# Patient Record
Sex: Male | Born: 1935 | Race: White | Hispanic: No | Marital: Married | Smoking: Former smoker
Health system: Southern US, Community
[De-identification: ages and names within clinical notes are randomized; demographics above are authoritative.]

## PROBLEM LIST (undated history)

## (undated) DIAGNOSIS — E785 Hyperlipidemia, unspecified: Secondary | ICD-10-CM

## (undated) DIAGNOSIS — E039 Hypothyroidism, unspecified: Secondary | ICD-10-CM

## (undated) DIAGNOSIS — I4891 Unspecified atrial fibrillation: Secondary | ICD-10-CM

## (undated) DIAGNOSIS — I10 Essential (primary) hypertension: Secondary | ICD-10-CM

## (undated) DIAGNOSIS — E119 Type 2 diabetes mellitus without complications: Secondary | ICD-10-CM

## (undated) DIAGNOSIS — K219 Gastro-esophageal reflux disease without esophagitis: Secondary | ICD-10-CM

## (undated) DIAGNOSIS — K449 Diaphragmatic hernia without obstruction or gangrene: Secondary | ICD-10-CM

## (undated) DIAGNOSIS — R079 Chest pain, unspecified: Secondary | ICD-10-CM

## (undated) DIAGNOSIS — J45909 Unspecified asthma, uncomplicated: Secondary | ICD-10-CM

## (undated) HISTORY — PX: APPENDECTOMY: SHX54

## (undated) HISTORY — DX: Type 2 diabetes mellitus without complications: E11.9

## (undated) HISTORY — DX: Chest pain, unspecified: R07.9

## (undated) HISTORY — DX: Essential (primary) hypertension: I10

## (undated) HISTORY — DX: Unspecified atrial fibrillation: I48.91

## (undated) HISTORY — PX: CATARACT EXTRACTION: SUR2

## (undated) HISTORY — PX: HIATAL HERNIA REPAIR: SHX195

## (undated) HISTORY — DX: Hyperlipidemia, unspecified: E78.5

## (undated) HISTORY — PX: PERCUTANEOUS CORONARY STENT INTERVENTION (PCI-S): SHX6016

## (undated) HISTORY — DX: Hypothyroidism, unspecified: E03.9

## (undated) HISTORY — DX: Gastro-esophageal reflux disease without esophagitis: K21.9

## (undated) HISTORY — DX: Unspecified asthma, uncomplicated: J45.909

---

## 2014-03-13 ENCOUNTER — Ambulatory Visit (INDEPENDENT_AMBULATORY_CARE_PROVIDER_SITE_OTHER): Payer: Medicare Other | Admitting: Family

## 2014-03-13 ENCOUNTER — Encounter: Payer: Self-pay | Admitting: Family

## 2014-03-13 VITALS — BP 132/62 | HR 53 | Temp 97.9°F | Resp 18 | Ht 68.0 in | Wt 167.0 lb

## 2014-03-13 DIAGNOSIS — IMO0002 Reserved for concepts with insufficient information to code with codable children: Secondary | ICD-10-CM

## 2014-03-13 DIAGNOSIS — D869 Sarcoidosis, unspecified: Secondary | ICD-10-CM

## 2014-03-13 DIAGNOSIS — I482 Chronic atrial fibrillation, unspecified: Secondary | ICD-10-CM

## 2014-03-13 DIAGNOSIS — E1165 Type 2 diabetes mellitus with hyperglycemia: Secondary | ICD-10-CM | POA: Diagnosis not present

## 2014-03-13 DIAGNOSIS — I2510 Atherosclerotic heart disease of native coronary artery without angina pectoris: Secondary | ICD-10-CM | POA: Insufficient documentation

## 2014-03-13 DIAGNOSIS — E031 Congenital hypothyroidism without goiter: Secondary | ICD-10-CM

## 2014-03-13 DIAGNOSIS — E039 Hypothyroidism, unspecified: Secondary | ICD-10-CM

## 2014-03-13 DIAGNOSIS — E785 Hyperlipidemia, unspecified: Secondary | ICD-10-CM

## 2014-03-13 DIAGNOSIS — E119 Type 2 diabetes mellitus without complications: Secondary | ICD-10-CM

## 2014-03-13 DIAGNOSIS — I25119 Atherosclerotic heart disease of native coronary artery with unspecified angina pectoris: Secondary | ICD-10-CM

## 2014-03-13 DIAGNOSIS — I4891 Unspecified atrial fibrillation: Secondary | ICD-10-CM | POA: Insufficient documentation

## 2014-03-13 HISTORY — DX: Type 2 diabetes mellitus without complications: E11.9

## 2014-03-13 HISTORY — DX: Hypothyroidism, unspecified: E03.9

## 2014-03-13 HISTORY — DX: Sarcoidosis, unspecified: D86.9

## 2014-03-13 NOTE — Progress Notes (Signed)
Pre visit review using our clinic review tool, if applicable. No additional management support is needed unless otherwise documented below in the visit note. 

## 2014-03-13 NOTE — Assessment & Plan Note (Signed)
Stable at present time based on pt's log of blood sugars. Obtain A1c and BMET at next visit. Refer to opthalmology for diabetic eye exam and podiatry for diabetic foot care. Continue current metformin.

## 2014-03-13 NOTE — Assessment & Plan Note (Signed)
History of coronary artery disease with PCI. Refer to cardiology per patient request to establish care and follow up with as needed. Currently stable with no chest pain or shortness of breath.

## 2014-03-13 NOTE — Progress Notes (Signed)
   Subjective:    Patient ID: Benjamin Mccall, male    DOB: 08-24-1929, 80 y.o.   MRN: [id number]  Chief Complaint  Patient presents with  . Establish Care    HPI:  Benjamin Mccall is a 80 y.o. male who presents today to establish care and discuss multiple medical conditions. He will be living at [facility] in [place].    1) Diabetes - Type II diabetic maintained on metformin. Denies any adverse side effects. Has had some burning in his right lower foot - possibly related to previous back surgery. Due for diabetic eye exam and would like to establish with an ophthalmologist . Would like to get referred to podiatry for diabetic foot care. He presents records from the assisted living facility which sho morning CBG around 90s and evening CBG between 120-140.   2) Hypertension - Currently maintained on HCTZ. Denies any changes to vision, chest pain/discomfort, shortness of breath, or edema.   3) Hypothyroidism - Congenital hypothyroidism and is maintained on levothyroxine 100 mcg. Denies any changes to skin, hair or nails. Denies heat/cold intolerances.   4) Coronary artery disease - Previous history of PCI with stent placement. Also has history significant for atrial fibrillation for which he currently takes diltiazem.   5) Sarcoidosis - Was followed intermittently [place] in [place]. Denies any current shortness of breath. Request[place] for future management and to establish care.   6) GERD - Maintained on OTC omeprazole as needed for indigestion and hearburn.  Allergies  Allergen Reactions  . Reglan [Metoclopramide]     Tremors   . Sulfa Antibiotics    Medications:  Past Medical History  Diagnosis Date  . Diabetes mellitus without complication   . GERD (gastroesophageal reflux disease)   . Hyperlipidemia   . Hypertension   . Hypothyroid   . Chest pain   . Atrial fibrillation   . Asthma     Review of  Systems    See HPI  Objective:    BP 132/62 mmHg  Pulse 53  Temp(Src) 97.9 F (36.6 C) (Oral)  Resp 18  Ht 5\' 8"  (1.727 m)  Wt 167 lb (75.751 kg)  BMI 25.40 kg/m2  SpO2 97% Nursing note and vital signs reviewed.  Physical Exam  Constitutional: He is oriented to person, place, and time. He appears well-developed and well-nourished. No distress.  Elderly gentleman seated in the chair who is dressed appropriately and appears his stated age.   Cardiovascular: Normal rate, regular rhythm, normal heart sounds and intact distal pulses.   Rhythm sounded like normal sinus at this time.  Pulmonary/Chest: Effort normal and breath sounds normal.  Neurological: He is alert and oriented to person, place, and time.  Skin: Skin is warm and dry.  Psychiatric: He has a normal mood and affect. His behavior is normal. Judgment and thought content normal.       Assessment & Plan:

## 2014-03-13 NOTE — Patient Instructions (Signed)
Thank you for choosing [facility].  Summary/Instructions:  Referrals have been made during this visit. You should expect to hear back from our schedulers in about 7-10 days in regards to establishing an appointment with the specialists we discussed.   Please schedule a follow up for about 1 month. In the meantime continue to take your medications as prescribed.

## 2014-03-13 NOTE — Assessment & Plan Note (Signed)
Previously diagnosed and not currently experiencing any acute problems. Refer to pulmonology at patient's request to establish care and further management as needed.

## 2014-03-13 NOTE — Assessment & Plan Note (Signed)
Stable and maintained on levothyroxine. Continue current levothyroxine and obtain TSH at next visit.

## 2014-03-13 NOTE — Assessment & Plan Note (Signed)
Maintained on Diltiazem. Exam reveals regular rate and rhythm with auscultation. Continue to monitor. Follow up as neede.d

## 2014-03-14 NOTE — Addendum Note (Signed)
Addended by: Mercer PodWRENN, Jaeanna Mccomber E on: 03/14/2014 10:26 AM   Modules accepted: Medications

## 2014-03-27 ENCOUNTER — Ambulatory Visit (INDEPENDENT_AMBULATORY_CARE_PROVIDER_SITE_OTHER)
Admission: RE | Admit: 2014-03-27 | Discharge: 2014-03-27 | Disposition: A | Discharge status: Home / Self Care | Payer: Medicare Other | Source: Ambulatory Visit | Attending: Internal Medicine | Admitting: Internal Medicine

## 2014-03-27 ENCOUNTER — Encounter: Payer: Self-pay | Admitting: Internal Medicine

## 2014-03-27 ENCOUNTER — Ambulatory Visit (INDEPENDENT_AMBULATORY_CARE_PROVIDER_SITE_OTHER): Payer: Medicare Other | Admitting: Internal Medicine

## 2014-03-27 DIAGNOSIS — I25119 Atherosclerotic heart disease of native coronary artery with unspecified angina pectoris: Secondary | ICD-10-CM

## 2014-03-27 DIAGNOSIS — D869 Sarcoidosis, unspecified: Secondary | ICD-10-CM

## 2014-03-27 MED ORDER — BUDESONIDE-FORMOTEROL FUMARATE 160-4.5 MCG/ACT IN AERO: 2.0000 | INHALATION_SPRAY | Freq: Two times a day (BID) | RESPIRATORY_TRACT | Status: DC

## 2014-03-27 NOTE — Patient Instructions (Signed)
Try symbicort 160 one twice daily to see if you don't do just as well  Please remember to go to the x-ray department downstairs for your tests - we will call you with the results when they are available.  Please schedule a follow up visit in 3 months but call sooner if needed with pfts on return

## 2014-03-27 NOTE — Progress Notes (Signed)
   Subjective:    Patient ID: Benjamin Passeyonald R Mccall, male    DOB: 29-Nov-1929,   MRN: [id number]  HPI  [age] yowm quit smoking at dx of sarcoid at age 80 dx in [place] by surgeon with neck node with cough/sob rx prednisone x at least a decade with chronic doe ever since then x yardwork and leveled off d/c  prednisone referred to pulmonary 03/27/2014  by [facility] primary care with doe s  Cough     03/27/2014 1st  Pulmonary office visit/ Ladasha Schnackenberg   Chief Complaint  Patient presents with  . Pulmonary Consult    Referred by [name], NP. Pt states had lung bx age 80 and was dxed with Sarcoid. [place]. He c/o SOB that comes and goes- sometimes gets out of breath when walking the dog and exposed to cold weather.   doe is chronic and more related to how heavy he exerts   Maybe better with symbicort ? Makes him hoarse  Has combivent but doesn't think it helps/ doesn't use it  No obvious  day to day or daytime variabilty or assoc chronic cough or cp or chest tightness, subjective wheeze overt sinus or hb symptoms. No unusual exp hx or h/o childhood pna/ asthma or knowledge of premature birth.  Sleeping ok without nocturnal  or early am exacerbation  of respiratory  c/o's or need for noct saba. Also denies any obvious fluctuation of symptoms with weather or environmental changes or other aggravating or alleviating factors except as outlined above   Current Medications, Allergies, Complete Past Medical History, Past Surgical History, Family History, and Social History were reviewed in [facility] electronic medical record.              Review of Systems  Constitutional: Negative for fever, chills, activity change, appetite change and unexpected weight change.  HENT: Negative for congestion, dental problem, postnasal drip, rhinorrhea, sneezing, sore throat, trouble swallowing and voice change.   Eyes: Negative for visual disturbance.  Respiratory: Positive for  shortness of breath. Negative for cough and choking.   Cardiovascular: Negative for chest pain and leg swelling.  Gastrointestinal: Negative for nausea, vomiting and abdominal pain.  Genitourinary: Negative for difficulty urinating.  Musculoskeletal: Positive for arthralgias.  Skin: Negative for rash.  Psychiatric/Behavioral: Negative for behavioral problems and confusion.       Objective:   Physical Exam  amb wm with classic voice fatigue   Wt Readings from Last 3 Encounters:  03/27/14 169 lb (76.658 kg)  03/13/14 167 lb (75.751 kg)    Vital signs reviewed   HEENT: nl dentition, turbinates, and orophanx. Nl external ear canals without cough reflex   NECK :  without JVD/Nodes/TM/ nl carotid upstrokes bilaterally   LUNGS: no acc muscle use, clear to A and P bilaterally without cough on insp or exp maneuvers   CV:  RRR  no s3 or murmur or increase in P2, no edema   ABD:  soft and nontender with nl excursion in the supine position. No bruits or organomegaly, bowel sounds nl  MS:  warm without deformities, calf tenderness, cyanosis or clubbing  SKIN: warm and dry without lesions    NEURO:  alert, approp, no deficits     CXR  03/27/2014 :  No active lung disease. Small calcified mediastinal and hilar nodes consistent with prior granulomatous disease.      Assessment & Plan:

## 2014-03-27 NOTE — Progress Notes (Signed)
Quick Note:  Spoke with pt and notified of results per Dr. Wert. Pt verbalized understanding and denied any questions.  ______ 

## 2014-03-29 NOTE — Assessment & Plan Note (Addendum)
-   dx in 1980's by surgical bx of lymph node in neck and rx x 10 years with prednisone for cough/sob - 03/27/2014  Walked RA x 3 laps @ 185 ft each stopped due to  End of study, no sob or desat @ slow pace  Hx (which includes absence of cough or variability to symptoms or change since stopped prednisone) and exam and cxr do not suggest active dz but he could have airway involvement..... But  I suspect he really is not "symbicort" dep so will ask him to start to wean by reducing to one bid dosing on a trial basis and return for pfts    Each maintenance medication was reviewed in detail including most importantly the difference between maintenance and as needed and under what circumstances the prns are to be used.  Please see instructions for details which were reviewed in writing and the patient given a copy.

## 2014-03-30 ENCOUNTER — Ambulatory Visit: Payer: Self-pay | Admitting: Podiatry

## 2014-04-03 ENCOUNTER — Telehealth: Payer: Self-pay | Admitting: Family

## 2014-04-03 ENCOUNTER — Encounter: Payer: Self-pay | Admitting: Family

## 2014-04-03 ENCOUNTER — Ambulatory Visit: Payer: Medicare Other | Admitting: Family

## 2014-04-03 ENCOUNTER — Other Ambulatory Visit (INDEPENDENT_AMBULATORY_CARE_PROVIDER_SITE_OTHER): Payer: Medicare Other

## 2014-04-03 ENCOUNTER — Ambulatory Visit (INDEPENDENT_AMBULATORY_CARE_PROVIDER_SITE_OTHER): Payer: Medicare Other | Admitting: Family

## 2014-04-03 VITALS — BP 170/78 | HR 51 | Temp 97.8°F | Resp 18 | Ht 67.0 in | Wt 164.1 lb

## 2014-04-03 DIAGNOSIS — I25119 Atherosclerotic heart disease of native coronary artery with unspecified angina pectoris: Secondary | ICD-10-CM

## 2014-04-03 DIAGNOSIS — E1165 Type 2 diabetes mellitus with hyperglycemia: Secondary | ICD-10-CM

## 2014-04-03 DIAGNOSIS — R21 Rash and other nonspecific skin eruption: Secondary | ICD-10-CM

## 2014-04-03 DIAGNOSIS — IMO0002 Reserved for concepts with insufficient information to code with codable children: Secondary | ICD-10-CM

## 2014-04-03 DIAGNOSIS — I10 Essential (primary) hypertension: Secondary | ICD-10-CM

## 2014-04-03 HISTORY — DX: Rash and other nonspecific skin eruption: R21

## 2014-04-03 LAB — BASIC METABOLIC PANEL
BUN: 28 mg/dL — ABNORMAL HIGH (ref 6–23)
CHLORIDE: 101 meq/L (ref 96–112)
CO2: 30 meq/L (ref 19–32)
Calcium: 9.9 mg/dL (ref 8.4–10.5)
Creatinine, Ser: 1.4 mg/dL (ref 0.4–1.5)
GFR: 51.15 mL/min — ABNORMAL LOW (ref >60.00)
Glucose, Bld: 97 mg/dL (ref 70–99)
Potassium: 4.3 mEq/L (ref 3.5–5.1)
SODIUM: 139 meq/L (ref 135–145)

## 2014-04-03 LAB — HEMOGLOBIN A1C: Hgb A1c MFr Bld: 6.5 % (ref 4.6–6.5)

## 2014-04-03 NOTE — Patient Instructions (Signed)
Thank you for choosing [facility].  Summary/Instructions:  Please stop by the lab on the basement level of the building for your blood work. Your results will be released to MyChart (or called to you) after review, usually within 72 hours after test completion. If any changes need to be made, you will be notified at that same time.     

## 2014-04-03 NOTE — Progress Notes (Addendum)
Subjective:    Patient ID: Benjamin Mccall, male    DOB: 1929/06/25, 80 y.o.   MRN: [id number]  Chief Complaint  Patient presents with  . Follow-up    diabetes is well controlled, has a bump on right arm that he would like to have checked x4 months    HPI:  Benjamin Mccall is a 80 y.o. male who presents today for follow up.   1) Hypertension - Indicates it has been running between 120-130s at home. Currently maintained on diltiazem, hydrochlorthiazide. Denies any chest pain/discomfort, heart palpitations or edema of lower extremity. Eye exam scheduled.   BP Readings from Last 3 Encounters:  04/03/14 170/78  03/27/14 120/68  03/13/14 132/62    2) Diabetes - Blood sugars have been running under 100 in the mornings. Maintained on the metformin. Scheduled for diabetic eye exam and podiatry exam on 12/9.   Lab Results  Component Value Date   HGBA1C 6.5 04/03/2014      3) Bump on right arm - Noticed a bump on the inside of his right forearm about 4-5 months ago and has been staying about the same. Describes it as a raised bump with redness and itching. Has tried lotion, which has not helped greatly.   Allergies  Allergen Reactions  . Reglan [Metoclopramide]     Tremors   . Sulfa Antibiotics    Current Outpatient Prescriptions on File Prior to Visit  Medication Sig Dispense Refill  . ALPRAZolam (XANAX) 0.5 MG tablet Take 0.5 mg by mouth at bedtime as needed for anxiety.    [name]. aspirin 81 MG tablet Take 81 mg by mouth daily.    [name]. atorvastatin (LIPITOR) 20 MG tablet Take 20 mg by mouth daily.    . budesonide-formoterol (SYMBICORT) 160-4.5 MCG/ACT inhaler Inhale 2 puffs into the lungs 2 (two) times daily. 1 Inhaler 0  . Cholecalciferol (VITAMIN D3) 1000 UNITS CAPS Take by mouth daily.    . citalopram (CELEXA) 10 MG tablet Take 10 mg by mouth daily.    . clopidogrel (PLAVIX) 75 MG tablet Take 75 mg by mouth daily.    [name]. diltiazem (CARDIZEM CD) 240 MG 24 hr capsule Take 240 mg  by mouth daily.    [name]. ezetimibe (ZETIA) 10 MG tablet Take 10 mg by mouth daily.    . fluticasone (FLONASE) 50 MCG/ACT nasal spray Place 2 sprays into both nostrils daily.    [name]. gabapentin (NEURONTIN) 100 MG capsule Take 100 mg by mouth 3 (three) times daily.    [name]. guaiFENesin (MUCINEX) 600 MG 12 hr tablet Take by mouth 2 (two) times daily.    . hydrochlorothiazide (HYDRODIURIL) 25 MG tablet Take 25 mg by mouth daily.    [name]. ibuprofen (ADVIL,MOTRIN) 200 MG tablet Take 200 mg by mouth every 6 (six) hours as needed.    . Ipratropium-Albuterol (COMBIVENT RESPIMAT IN) Inhale into the lungs. 1 puff as needed    . levothyroxine (SYNTHROID, LEVOTHROID) 100 MCG tablet Take 100 mcg by mouth daily.    . metFORMIN (GLUCOPHAGE) 500 MG tablet Take by mouth 2 (two) times daily with a meal.    . Multiple Vitamin (THERA-TABS PO) Take by mouth daily.    . nitroGLYCERIN (NITRODUR - DOSED IN MG/24 HR) 0.6 mg/hr patch Place 0.6 mg onto the skin daily.    . nitroGLYCERIN (NITROSTAT) 0.4 MG SL tablet Place 0.4 mg under the tongue as needed for chest pain.    [name]. omeprazole (PRILOSEC) 20 MG capsule Take  20 mg by mouth daily.    . polyethylene glycol powder (GLYCOLAX/MIRALAX) powder Take 1 Container by mouth once.    . potassium chloride (MICRO-K) 10 MEQ CR capsule Take 10 mEq by mouth 2 (two) times daily.     No current facility-administered medications on file prior to visit.   Past Medical History  Diagnosis Date  . Diabetes mellitus without complication   . GERD (gastroesophageal reflux disease)   . Hyperlipidemia   . Hypertension   . Hypothyroid   . Chest pain   . Atrial fibrillation   . Asthma     Review of Systems     See HPI  Objective:    BP 170/78 mmHg  Pulse 51  Temp(Src) 97.8 F (36.6 C) (Oral)  Resp 18  Ht 5\' 7"  (1.702 m)  Wt 164 lb 1.9 oz (74.444 kg)  BMI 25.70 kg/m2  SpO2 97% Nursing note and vital signs reviewed.  Physical Exam  Constitutional: He is oriented to person, place, and  time. He appears well-developed and well-nourished. No distress.  Elderly gentleman seated in a chair with a cane . Appears his stated age. Dressed appropriate for the situation.  Cardiovascular: Normal rate, regular rhythm, normal heart sounds and intact distal pulses.   Pulmonary/Chest: Effort normal and breath sounds normal.  Neurological: He is alert and oriented to person, place, and time.  Skin: Skin is warm and dry.  Small firm nodule noted anterior forearm. Nonblanching. Denies tenderness. No warmth noted.  Psychiatric: He has a normal mood and affect. His behavior is normal. Judgment and thought content normal.       Assessment & Plan:

## 2014-04-03 NOTE — Telephone Encounter (Signed)
Please call the patient and informed him that his hemoglobin A1c was 6.5 which indicates his diabetes is well-controlled with his current regimen. We will also have to continue to monitor his kidney function, it is adequate at this time. The best thing we can do to preserve his kidney function is to maintain good blood sugar control and blood pressure control.

## 2014-04-03 NOTE — Assessment & Plan Note (Addendum)
Currently he remains stable this time. All diabetic prevention exams are scheduled or in the process.obtain A1c and basic metabolic panel today. Continue metformin at current dose.

## 2014-04-03 NOTE — Progress Notes (Signed)
Pre visit review using our clinic review tool, if applicable. No additional management support is needed unless otherwise documented below in the visit note. 

## 2014-04-03 NOTE — Assessment & Plan Note (Signed)
Stable with current regimen. Continue diltiazem and hydrochlorothiazide.

## 2014-04-04 ENCOUNTER — Ambulatory Visit (INDEPENDENT_AMBULATORY_CARE_PROVIDER_SITE_OTHER): Payer: Medicare Other | Admitting: Podiatry

## 2014-04-04 ENCOUNTER — Encounter: Payer: Self-pay | Admitting: Podiatry

## 2014-04-04 VITALS — BP 112/56 | HR 58 | Resp 18

## 2014-04-04 DIAGNOSIS — B353 Tinea pedis: Secondary | ICD-10-CM

## 2014-04-04 DIAGNOSIS — B351 Tinea unguium: Secondary | ICD-10-CM

## 2014-04-04 DIAGNOSIS — M79676 Pain in unspecified toe(s): Secondary | ICD-10-CM

## 2014-04-04 DIAGNOSIS — I25119 Atherosclerotic heart disease of native coronary artery with unspecified angina pectoris: Secondary | ICD-10-CM

## 2014-04-04 NOTE — Progress Notes (Signed)
   Subjective:    Patient ID: Benjamin Mccall, male    DOB: November 13, 1929, 80 y.o.   MRN: [id number]  HPI  80 year old male presents the office today for painful elongated toenails and for diabetic risk assessment. Patient states his blood sugar in the morning typically run between 83-90 5 in the afternoon between 120-1:30. Patient denies any history of ulceration or a intermittent claudication symptoms. He doesn't that he has had some back problems and he has had some tingling and numbness to his feet which is intermittent in nature. He is previously been told that this is from his back. He denies any recent redness or drainage from the nail sites. He is unable to trim the nails himself. No other complaints at this time.    Review of Systems  All other systems reviewed and are negative.      Objective:   Physical Exam AAO 3, NAD DP/PT pulses palpable bilaterally, CRT less than 3 seconds Protective sensation intact with Simms Weinstein monofilament, vibratory sensation intact, Achilles tendon reflex intact. Nails hypertrophic, dystrophic, elongated, brittle, discolored 10. No swelling erythema or drainage from around the nail sites. No open lesions or pre-ulcerative lesions. There is mild dry, scaly, pealing skin with a slight erythematous base the bilateral feet consistent with tinea pedis. MMT 5/5, ROM WNL No pain with calf compression, swelling, warmth, erythema.     Assessment & Plan:  80 year old male with symptomatic onychomycosis, likely tinea pedis -Treatment options were discussed including alternatives, risks, complications. -Nail sharply debrided 10 without complications. -For likely tinea pedis discussed purchasing OTC Lamisil cream or Lotrimin cream. -Discussed the importance of daily foot inspection. -Follow-up in 3 months or sooner if any problems are to arise or any changes symptoms. In the meantime, call the office in the questions, concerns, change in symptoms.

## 2014-04-04 NOTE — Patient Instructions (Signed)
Athlete's Foot Athlete's foot (tinea pedis) is a fungal infection of the skin on the feet. It often occurs on the skin between the toes or underneath the toes. It can also occur on the soles of the feet. Athlete's foot is more likely to occur in hot, humid weather. Not washing your feet or changing your socks often enough can contribute to athlete's foot. The infection can spread from person to person (contagious). CAUSES Athlete's foot is caused by a fungus. This fungus thrives in warm, moist places. Most people get athlete's foot by sharing shower stalls, towels, and wet floors with an infected person. People with weakened immune systems, including those with diabetes, may be more likely to get athlete's foot. SYMPTOMS   Itchy areas between the toes or on the soles of the feet.  White, flaky, or scaly areas between the toes or on the soles of the feet.  Tiny, intensely itchy blisters between the toes or on the soles of the feet.  Tiny cuts on the skin. These cuts can develop a bacterial infection.  Thick or discolored toenails. DIAGNOSIS  Your caregiver can usually tell what the problem is by doing a physical exam. Your caregiver may also take a skin sample from the rash area. The skin sample may be examined under a microscope, or it may be tested to see if fungus will grow in the sample. A sample may also be taken from your toenail for testing. TREATMENT  Over-the-counter and prescription medicines can be used to kill the fungus. These medicines are available as powders or creams. Your caregiver can suggest medicines for you. Fungal infections respond slowly to treatment. You may need to continue using your medicine for several weeks. PREVENTION   Do not share towels.  Wear sandals in wet areas, such as shared locker rooms and shared showers.  Keep your feet dry. Wear shoes that allow air to circulate. Wear cotton or wool socks. HOME CARE INSTRUCTIONS   Take medicines as directed by  your caregiver. Do not use steroid creams on athlete's foot.  Keep your feet clean and cool. Wash your feet daily and dry them thoroughly, especially between your toes.  Change your socks every day. Wear cotton or wool socks. In hot climates, you may need to change your socks 2 to 3 times per day.  Wear sandals or canvas tennis shoes with good air circulation.  If you have blisters, soak your feet in Burow's solution or Epsom salts for 20 to 30 minutes, 2 times a day to dry out the blisters. Make sure you dry your feet thoroughly afterward. SEEK MEDICAL CARE IF:   You have a fever.  You have swelling, soreness, warmth, or redness in your foot.  You are not getting better after 7 days of treatment.  You are not completely cured after 30 days.  You have any problems caused by your medicines. MAKE SURE YOU:   Understand these instructions.  Will watch your condition.  Will get help right away if you are not doing well or get worse. Document Released: 04/10/2000 Document Revised: 07/06/2011 Document Reviewed: 01/30/2011 Exit[employer] Patient Information 2015 [employer]. This information is not intended to replace advice given to you by your health care provider. Make sure you discuss any questions you have with your health care provider. Diabetes and Foot Care Diabetes may cause you to have problems because of poor blood supply (circulation) to your feet and legs. This may cause the skin on your feet to become   thinner, break easier, and heal more slowly. Your skin may become dry, and the skin may peel and crack. You may also have nerve damage in your legs and feet causing decreased feeling in them. You may not notice minor injuries to your feet that could lead to infections or more serious problems. Taking care of your feet is one of the most important things you can do for yourself.  HOME CARE INSTRUCTIONS  Wear shoes at all times, even in the house. Do not go barefoot. Bare feet are  easily injured.  Check your feet daily for blisters, cuts, and redness. If you cannot see the bottom of your feet, use a mirror or ask someone for help.  Wash your feet with warm water (do not use hot water) and mild soap. Then pat your feet and the areas between your toes until they are completely dry. Do not soak your feet as this can dry your skin.  Apply a moisturizing lotion or petroleum jelly (that does not contain alcohol and is unscented) to the skin on your feet and to dry, brittle toenails. Do not apply lotion between your toes.  Trim your toenails straight across. Do not dig under them or around the cuticle. File the edges of your nails with an emery board or nail file.  Do not cut corns or calluses or try to remove them with medicine.  Wear clean socks or stockings every day. Make sure they are not too tight. Do not wear knee-high stockings since they may decrease blood flow to your legs.  Wear shoes that fit properly and have enough cushioning. To break in new shoes, wear them for just a few hours a day. This prevents you from injuring your feet. Always look in your shoes before you put them on to be sure there are no objects inside.  Do not cross your legs. This may decrease the blood flow to your feet.  If you find a minor scrape, cut, or break in the skin on your feet, keep it and the skin around it clean and dry. These areas may be cleansed with mild soap and water. Do not cleanse the area with peroxide, alcohol, or iodine.  When you remove an adhesive bandage, be sure not to damage the skin around it.  If you have a wound, look at it several times a day to make sure it is healing.  Do not use heating pads or hot water bottles. They may burn your skin. If you have lost feeling in your feet or legs, you may not know it is happening until it is too late.  Make sure your health care provider performs a complete foot exam at least annually or more often if you have foot  problems. Report any cuts, sores, or bruises to your health care provider immediately. SEEK MEDICAL CARE IF:   You have an injury that is not healing.  You have cuts or breaks in the skin.  You have an ingrown nail.  You notice redness on your legs or feet.  You feel burning or tingling in your legs or feet.  You have pain or cramps in your legs and feet.  Your legs or feet are numb.  Your feet always feel cold. SEEK IMMEDIATE MEDICAL CARE IF:   There is increasing redness, swelling, or pain in or around a wound.  There is a red line that goes up your leg.  Pus is coming from a wound.  You develop a fever   or as directed by your health care provider.  You notice a bad smell coming from an ulcer or wound. Document Released: 04/10/2000 Document Revised: 12/14/2012 Document Reviewed: 09/20/2012 ExitCare Patient Information 2015 [employer]. This information is not intended to replace advice given to you by your health care provider. Make sure you discuss any questions you have with your health care provider.  

## 2014-04-05 NOTE — Telephone Encounter (Signed)
Pt aware.

## 2014-04-23 NOTE — Progress Notes (Signed)
Patient ID: Manson PasseyDonald R Heidler, male   DOB: May 30, 1929, 80 y.o.   MRN: [id number]     80 yo diabetic referred for cardiovascular f/u History of HTN and chronic sarcoid seen by Dr Johnnette LitterWert Saw Dr Filbert [place]ilderHobart in [place].  Distant history of CAD with stents more then 10 years ago Had angina at that time and none recently.  ? PAF but in SR now and not on anticoagulation.  He still drives and gets around well outside of orthopedic issues. Mild exertional dyspnea no chest pain palpitations , syncope or TIA's.  Compliant with meds Lives at [facility] with wife of 50 years although she has recently been at [facility] for rehab from stroke.  Son with him today and seems invested in his care.  Mild chronic LE edema on diuretic     ROS: Denies fever, malais, weight loss, blurry vision, decreased visual acuity, cough, sputum, SOB, hemoptysis, pleuritic pain, palpitaitons, heartburn, abdominal pain, melena, lower extremity edema, claudication, or rash.  All other systems reviewed and negative   General: Affect appropriate Frail elderly male  HEENT: normal Neck supple with no adenopathy JVP normal no bruits no thyromegaly Lungs clear with no wheezing and good diaphragmatic motion Heart:  S1/S2 systolic ejection  murmur,rub, gallop or click PMI normal Abdomen: benighn, BS positve, no tenderness, no AAA no bruit.  No HSM or HJR Distal pulses intact with no bruits Plus one bilateral edema Neuro non-focal Skin warm and dry No muscular weakness  Medications Current Outpatient Prescriptions  Medication Sig Dispense Refill  . ALPRAZolam (XANAX) 0.5 MG tablet Take 0.5 mg by mouth at bedtime as needed for anxiety.    [name]. aspirin 81 MG tablet Take 81 mg by mouth daily.    [name]. atorvastatin (LIPITOR) 20 MG tablet Take 20 mg by mouth daily.    . budesonide-formoterol (SYMBICORT) 160-4.5 MCG/ACT inhaler Inhale 2 puffs into the lungs 2 (two) times daily. 1 Inhaler 0  . Cholecalciferol (VITAMIN D3) 1000 UNITS CAPS Take by  mouth daily.    . citalopram (CELEXA) 10 MG tablet Take 10 mg by mouth daily.    . clopidogrel (PLAVIX) 75 MG tablet Take 75 mg by mouth daily.    [name]. diltiazem (CARDIZEM CD) 240 MG 24 hr capsule Take 240 mg by mouth daily.    [name]. ezetimibe (ZETIA) 10 MG tablet Take 10 mg by mouth daily.    . fluticasone (FLONASE) 50 MCG/ACT nasal spray Place 2 sprays into both nostrils daily.    [name]. gabapentin (NEURONTIN) 100 MG capsule Take 100 mg by mouth 3 (three) times daily.    [name]. guaiFENesin (MUCINEX) 600 MG 12 hr tablet Take by mouth 2 (two) times daily.    . hydrochlorothiazide (HYDRODIURIL) 25 MG tablet Take 25 mg by mouth daily.    [name]. ibuprofen (ADVIL,MOTRIN) 200 MG tablet Take 200 mg by mouth every 6 (six) hours as needed.    . Ipratropium-Albuterol (COMBIVENT RESPIMAT IN) Inhale into the lungs. 1 puff as needed    . levothyroxine (SYNTHROID, LEVOTHROID) 100 MCG tablet Take 100 mcg by mouth daily.    . metFORMIN (GLUCOPHAGE) 500 MG tablet Take by mouth 2 (two) times daily with a meal.    . Multiple Vitamin (THERA-TABS PO) Take by mouth daily.    . nitroGLYCERIN (NITRODUR - DOSED IN MG/24 HR) 0.6 mg/hr patch Place 0.6 mg onto the skin daily.    . nitroGLYCERIN (NITROSTAT) 0.4 MG SL tablet Place 0.4 mg under the tongue as needed for chest  pain.    . omeprazole (PRILOSEC) 20 MG capsule Take 20 mg by mouth daily.    . polyethylene glycol powder (GLYCOLAX/MIRALAX) powder Take 1 Container by mouth once.    . potassium chloride (MICRO-K) 10 MEQ CR capsule Take 10 mEq by mouth 2 (two) times daily.     No current facility-administered medications for this visit.    Allergies Reglan and Sulfa antibiotics  Family History: Family History  Problem Relation Age of Onset  . Hypertension Mother   . Stroke Mother   . Ovarian cancer Mother   . Stroke Father   . Diabetes Son     Social History: History   Social History  . Marital Status: Married    Spouse Name: N/A    Number of Children: 2  . Years of  Education: 12   Occupational History  . 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 Smoking status: Former Smoker -- 0.50 packs/day for 7 years    Types: Pipe, Cigars    Quit date: 04/27/1968  . Smokeless tobacco: Never Used  . Alcohol Use: No  . Drug Use: No  . Sexual Activity: Not on file   Other Topics Concern  . Not on file   Social History Narrative   [place]. Residing in [facility] Living with his wife. Denies any beliefs effecting healthcare.     Past Surgical History  Procedure Laterality Date  . Percutaneous coronary stent intervention (pci-s)    . Hiatal hernia repair    . Cataract extraction    . Appendectomy      Past Medical History  Diagnosis Date  . Diabetes mellitus without complication   . GERD (gastroesophageal reflux disease)   . Hyperlipidemia   . Hypertension   . Hypothyroid   . Chest pain   . Atrial fibrillation   . Asthma     Electrocardiogram:  SR rate 51  LAD PR 230  LVH   Assessment and Plan

## 2014-04-24 ENCOUNTER — Ambulatory Visit (INDEPENDENT_AMBULATORY_CARE_PROVIDER_SITE_OTHER): Payer: Medicare Other | Admitting: Cardiovascular Disease

## 2014-04-24 ENCOUNTER — Encounter: Payer: Self-pay | Admitting: Cardiovascular Disease

## 2014-04-24 VITALS — BP 124/70 | HR 51 | Ht 67.0 in | Wt 165.0 lb

## 2014-04-24 DIAGNOSIS — E785 Hyperlipidemia, unspecified: Secondary | ICD-10-CM

## 2014-04-24 DIAGNOSIS — I2583 Coronary atherosclerosis due to lipid rich plaque: Secondary | ICD-10-CM

## 2014-04-24 DIAGNOSIS — I480 Paroxysmal atrial fibrillation: Secondary | ICD-10-CM

## 2014-04-24 DIAGNOSIS — R011 Cardiac murmur, unspecified: Secondary | ICD-10-CM

## 2014-04-24 DIAGNOSIS — I2510 Atherosclerotic heart disease of native coronary artery without angina pectoris: Secondary | ICD-10-CM

## 2014-04-24 DIAGNOSIS — I10 Essential (primary) hypertension: Secondary | ICD-10-CM

## 2014-04-24 NOTE — Assessment & Plan Note (Addendum)
Stable with no angina and good activity level.  Continue medical Rx Not clear of reason for continued plavix but no bleeding issues Given age, distant nature of stents and asymptomatic state don't think stress test needed  Echo to assess EF

## 2014-04-24 NOTE — Assessment & Plan Note (Signed)
Well controlled.  Continue current medications and low sodium Dash type diet.    

## 2014-04-24 NOTE — Assessment & Plan Note (Signed)
Unclear history ECG NSR today no palpitations not on anticoagulation Monitor  Echo for EF and atrial sizes

## 2014-04-24 NOTE — Assessment & Plan Note (Signed)
Cholesterol is at goal.  Continue current dose of statin and diet Rx.  No myalgias or side effects.  F/U  LFT's in 6 months. No results found for: LDLCALC           

## 2014-04-24 NOTE — Patient Instructions (Signed)

## 2014-04-25 ENCOUNTER — Ambulatory Visit (HOSPITAL_COMMUNITY): Payer: Medicare Other | Attending: Cardiology | Admitting: Radiology

## 2014-04-25 DIAGNOSIS — R079 Chest pain, unspecified: Secondary | ICD-10-CM | POA: Diagnosis not present

## 2014-04-25 DIAGNOSIS — I480 Paroxysmal atrial fibrillation: Secondary | ICD-10-CM

## 2014-04-25 DIAGNOSIS — E785 Hyperlipidemia, unspecified: Secondary | ICD-10-CM | POA: Diagnosis not present

## 2014-04-25 DIAGNOSIS — R011 Cardiac murmur, unspecified: Secondary | ICD-10-CM

## 2014-04-25 NOTE — Progress Notes (Signed)
Echocardiogram performed.  

## 2014-04-29 ENCOUNTER — Encounter (HOSPITAL_COMMUNITY): Payer: Self-pay | Admitting: Emergency Medicine

## 2014-04-29 ENCOUNTER — Emergency Department (HOSPITAL_COMMUNITY)
Admission: EM | Admit: 2014-04-29 | Discharge: 2014-04-29 | Disposition: A | Discharge status: Home / Self Care | Payer: Medicare Other | Attending: Emergency Medicine | Admitting: Emergency Medicine

## 2014-04-29 DIAGNOSIS — E039 Hypothyroidism, unspecified: Secondary | ICD-10-CM | POA: Insufficient documentation

## 2014-04-29 DIAGNOSIS — Z7982 Long term (current) use of aspirin: Secondary | ICD-10-CM | POA: Diagnosis not present

## 2014-04-29 DIAGNOSIS — Z7902 Long term (current) use of antithrombotics/antiplatelets: Secondary | ICD-10-CM | POA: Diagnosis not present

## 2014-04-29 DIAGNOSIS — I10 Essential (primary) hypertension: Secondary | ICD-10-CM | POA: Insufficient documentation

## 2014-04-29 DIAGNOSIS — K219 Gastro-esophageal reflux disease without esophagitis: Secondary | ICD-10-CM | POA: Insufficient documentation

## 2014-04-29 DIAGNOSIS — Z87891 Personal history of nicotine dependence: Secondary | ICD-10-CM | POA: Diagnosis not present

## 2014-04-29 DIAGNOSIS — E785 Hyperlipidemia, unspecified: Secondary | ICD-10-CM | POA: Diagnosis not present

## 2014-04-29 DIAGNOSIS — Z79899 Other long term (current) drug therapy: Secondary | ICD-10-CM | POA: Insufficient documentation

## 2014-04-29 DIAGNOSIS — E119 Type 2 diabetes mellitus without complications: Secondary | ICD-10-CM | POA: Insufficient documentation

## 2014-04-29 DIAGNOSIS — R040 Epistaxis: Secondary | ICD-10-CM | POA: Insufficient documentation

## 2014-04-29 DIAGNOSIS — J45909 Unspecified asthma, uncomplicated: Secondary | ICD-10-CM | POA: Insufficient documentation

## 2014-04-29 DIAGNOSIS — Z7951 Long term (current) use of inhaled steroids: Secondary | ICD-10-CM | POA: Diagnosis not present

## 2014-04-29 MED ORDER — OXYMETAZOLINE HCL 0.05 % NA SOLN
2.0000 | Freq: Once | NASAL | Status: AC
  Administered 2014-04-29: 2 via NASAL
  Filled 2014-04-29: qty 15

## 2014-04-29 NOTE — ED Notes (Signed)
Pt reports that he has had a nosebleed since 1900 after blowing his nose today. Pt states that the bleeding has slowed, but continues to bleed when he blows his nose to clear the clots. Pt a&ox4, skin warm and dry, ambulatory without difficulty, denies pain.

## 2014-04-29 NOTE — ED Provider Notes (Signed)
CSN: [id number]     Arrival date & time 04/29/14  2157 History   First MD Initiated Contact with Patient 04/29/14 2232     This chart was scribed for non-physician practitioner, Earley Favor, FNP working with Purvis Sheffield, MD by Arlan Organ, ED Scribe. This patient was seen in room WTR5/WTR5 and the patient's care was started at 10:41 PM.   Chief Complaint  Patient presents with  . Epistaxis   The history is provided by the patient. No language interpreter was used.    HPI Comments: Benjamin Mccall is a 81 y.o. male with a PMHx of DM, GERD, hyperlipidemia, HTN, hypothyroid, and A-Fib who presents to the Emergency Department complaining of L sided epistaxis onset just prior to arrival. Pt reports ongoing intermittent epistaxis since 1990. States he blew his nose today and noted ongoing bleeding from his nose. Bleeding has slowed but continues to bleed when he blows his nose to clear clots. Pt admits to recent upper respiratory cold symptoms. Mr. Kerby was prescribed Flonase 2 weeks ago to help manage his symptoms. No recent fever of chills. Pt with known allergies to Reglan and Sulfa antibiotics.  Past Medical History  Diagnosis Date  . Diabetes mellitus without complication   . GERD (gastroesophageal reflux disease)   . Hyperlipidemia   . Hypertension   . Hypothyroid   . Chest pain   . Atrial fibrillation   . Asthma    Past Surgical History  Procedure Laterality Date  . Percutaneous coronary stent intervention (pci-s)    . Hiatal hernia repair    . Cataract extraction    . Appendectomy     Family History  Problem Relation Age of Onset  . Hypertension Mother   . Stroke Mother   . Ovarian cancer Mother   . Stroke Father   . Diabetes Son    History  Substance Use Topics  . Smoking status: Former Smoker -- 0.50 packs/day for 7 years    Types: Pipe, Cigars    Quit date: 04/27/1968  . Smokeless tobacco: Never Used  . Alcohol Use: No    Review of Systems   Constitutional: Negative for fever and chills.  HENT: Positive for nosebleeds.       Allergies  Reglan and Sulfa antibiotics  Home Medications   Prior to Admission medications   Medication Sig Start Date End Date Taking? Authorizing Provider  ALPRAZolam Prudy Feeler) 0.5 MG tablet Take 0.5 mg by mouth at bedtime as needed for anxiety.    Historical Provider, MD  aspirin 81 MG tablet Take 81 mg by mouth daily.    Historical Provider, MD  atorvastatin (LIPITOR) 20 MG tablet Take 20 mg by mouth daily.    Historical Provider, MD  budesonide-formoterol (SYMBICORT) 160-4.5 MCG/ACT inhaler Inhale 2 puffs into the lungs 2 (two) times daily. 03/27/14   Nyoka Cowden, MD  Cholecalciferol (VITAMIN D3) 1000 UNITS CAPS Take by mouth daily.    Historical Provider, MD  citalopram (CELEXA) 10 MG tablet Take 10 mg by mouth daily.    Historical Provider, MD  clopidogrel (PLAVIX) 75 MG tablet Take 75 mg by mouth daily.    Historical Provider, MD  diltiazem (CARDIZEM CD) 240 MG 24 hr capsule Take 240 mg by mouth daily.    Historical Provider, MD  ezetimibe (ZETIA) 10 MG tablet Take 10 mg by mouth daily.    Historical Provider, MD  fluticasone (FLONASE) 50 MCG/ACT nasal spray Place 2 sprays into both nostrils daily.  Historical Provider, MD  gabapentin (NEURONTIN) 300 MG capsule Take 300 mg by mouth at bedtime.    Historical Provider, MD  guaiFENesin (MUCINEX) 600 MG 12 hr tablet Take by mouth 2 (two) times daily.    Historical Provider, MD  hydrochlorothiazide (HYDRODIURIL) 25 MG tablet Take 25 mg by mouth daily.    Historical Provider, MD  ibuprofen (ADVIL,MOTRIN) 200 MG tablet Take 200 mg by mouth every 6 (six) hours as needed.    Historical Provider, MD  Ipratropium-Albuterol (COMBIVENT RESPIMAT IN) Inhale into the lungs. 1 puff as needed    Historical Provider, MD  levothyroxine (SYNTHROID, LEVOTHROID) 100 MCG tablet Take 100 mcg by mouth daily.    Historical Provider, MD  metFORMIN (GLUCOPHAGE) 500 MG  tablet Take by mouth 2 (two) times daily with a meal.    Historical Provider, MD  Multiple Vitamin (THERA-TABS PO) Take by mouth daily.    Historical Provider, MD  nitroGLYCERIN (NITRODUR - DOSED IN MG/24 HR) 0.6 mg/hr patch Place 0.6 mg onto the skin daily.    Historical Provider, MD  nitroGLYCERIN (NITROSTAT) 0.4 MG SL tablet Place 0.4 mg under the tongue as needed for chest pain.    Historical Provider, MD  omeprazole (PRILOSEC) 20 MG capsule Take 20 mg by mouth daily.    Historical Provider, MD  polyethylene glycol powder (GLYCOLAX/MIRALAX) powder Take 1 Container by mouth once.    Historical Provider, MD  potassium chloride (MICRO-K) 10 MEQ CR capsule Take 10 mEq by mouth 2 (two) times daily.    Historical Provider, MD   Triage Vitals: BP 154/62 mmHg  Pulse 55  Temp(Src) 98.6 F (37 C) (Oral)  Resp 20  SpO2 99%   Physical Exam  Constitutional: He is oriented to person, place, and time. He appears well-developed and well-nourished.  HENT:  Head: Normocephalic.  Nose: Mucosal edema present. No rhinorrhea or septal deviation. No epistaxis.  Mouth/Throat: Oropharynx is clear and moist.  superficial vessels no active bleeding at this time   Eyes: EOM are normal.  Neck: Normal range of motion.  Pulmonary/Chest: Effort normal.  Abdominal: He exhibits no distension.  Musculoskeletal: Normal range of motion.  Neurological: He is alert and oriented to person, place, and time.  Skin: Skin is warm.  Psychiatric: He has a normal mood and affect.  Nursing note and vitals reviewed.   ED Course  Procedures (including critical care time)  DIAGNOSTIC STUDIES: Oxygen Saturation is 99% on RA, Normal by my interpretation.    COORDINATION OF CARE: 10:34 PM-Discussed treatment plan with pt at bedside and pt agreed to plan.     Labs Review Labs Reviewed - No data to display  Imaging Review No results found.   EKG Interpretation None      MDM  Will stop Flonase use Afrin BID for 3  days  Final diagnoses:  None       I personally performed the services described in this documentation, which was scribed in my presence. The recorded information has been reviewed and is accurate.    Arman Filter, NP 04/29/14 2258  Purvis Sheffield, MD 04/29/14 2325

## 2014-04-29 NOTE — Discharge Instructions (Signed)
Nosebleed A nosebleed can be caused by many things, including:  Getting hit hard in the nose.  Infections.  Dry nose.  Colds.  Medicines. Your doctor may do lab testing if you get nosebleeds a lot and the cause is not known. HOME CARE   If your nose was packed with material, keep it there until your doctor takes it out. Put the pack back in your nose if the pack falls out.  Do not blow your nose for 12 hours after the nosebleed.  Sit up and bend forward if your nose starts bleeding again. Pinch the front half of your nose nonstop for 20 minutes.  Put petroleum jelly inside your nose every morning if you have a dry nose.  Use a humidifier to make the air less dry.  Do not take aspirin.  Try not to strain, lift, or bend at the waist for many days after the nosebleed. GET HELP RIGHT AWAY IF:   Nosebleeds keep happening and are hard to stop or control.  You have bleeding or bruises that are not normal on other parts of the body.  You have a fever.  The nosebleeds get worse.  You get lightheaded, feel faint, sweaty, or throw up (vomit) blood. MAKE SURE YOU:   Understand these instructions.  Will watch your condition.  Will get help right away if you are not doing well or get worse. Document Released: 01/21/2008 Document Revised: 07/06/2011 Document Reviewed: 01/21/2008 [facility] Patient Information 2015 [place], [place]. This information is not intended to replace advice given to you by your health care provider. Make sure you discuss any questions you have with your health care provider.     STOP THE FLONASE  USE THE AFRIN 1-2 SPRAYS TO LEFT NOSTRIL TWICE A DAY FOR 3 DAYS ONLY

## 2014-06-29 ENCOUNTER — Ambulatory Visit (INDEPENDENT_AMBULATORY_CARE_PROVIDER_SITE_OTHER): Payer: Medicare Other | Admitting: Internal Medicine

## 2014-06-29 ENCOUNTER — Encounter: Payer: Self-pay | Admitting: Internal Medicine

## 2014-06-29 VITALS — BP 146/76 | HR 60 | Ht 66.5 in | Wt 170.0 lb

## 2014-06-29 DIAGNOSIS — J449 Chronic obstructive pulmonary disease, unspecified: Secondary | ICD-10-CM | POA: Diagnosis not present

## 2014-06-29 DIAGNOSIS — D869 Sarcoidosis, unspecified: Secondary | ICD-10-CM | POA: Diagnosis not present

## 2014-06-29 LAB — PULMONARY FUNCTION TEST
DL/VA % pred: 99 %
DL/VA: 4.33 ml/min/mmHg/L
DLCO UNC: 18 ml/min/mmHg
DLCO unc % pred: 65 %
FEF 25-75 POST: 1.08 L/s
FEF 25-75 Pre: 0.75 L/sec
FEF2575-%CHANGE-POST: 44 %
FEF2575-%Pred-Post: 69 %
FEF2575-%Pred-Pre: 48 %
FEV1-%CHANGE-POST: 10 %
FEV1-%Pred-Post: 72 %
FEV1-%Pred-Pre: 65 %
FEV1-POST: 1.7 L
FEV1-Pre: 1.54 L
FEV1FVC-%Change-Post: 4 %
FEV1FVC-%Pred-Pre: 89 %
FEV6-%Change-Post: 5 %
FEV6-%PRED-PRE: 76 %
FEV6-%Pred-Post: 80 %
FEV6-POST: 2.49 L
FEV6-PRE: 2.37 L
FEV6FVC-%Change-Post: 0 %
FEV6FVC-%PRED-POST: 105 %
FEV6FVC-%Pred-Pre: 105 %
FVC-%Change-Post: 5 %
FVC-%PRED-PRE: 72 %
FVC-%Pred-Post: 76 %
FVC-PRE: 2.43 L
FVC-Post: 2.57 L
PRE FEV6/FVC RATIO: 97 %
Post FEV1/FVC ratio: 66 %
Post FEV6/FVC ratio: 97 %
Pre FEV1/FVC ratio: 64 %
RV % pred: 115 %
RV: 2.91 L
TLC % pred: 80 %
TLC: 5.11 L

## 2014-06-29 NOTE — Patient Instructions (Addendum)
You have mild to moderate copd which is unlikely to progress  Continue symbicort 160 Take 2 puffs first thing in am and then another 2 puffs about 12 hours later.   Only use your albuterol(proair) as a rescue medication to be used if you can't catch your breath by resting or doing a relaxed purse lip breathing pattern.  - The less you use it, the better it will work when you need it. - Ok to use up to 2 puffs  every 4 hours if you must but call for immediate appointment if use goes up over your usual need - Don't leave home without it !!  (think of it like the spare tire for your car)    If you are satisfied with your treatment plan,  let your doctor know and he/she can either refill your medications or you can return here when your prescription runs out.     If in any way you are not 100% satisfied,  please tell us.  If 100% better, tell your friends!  Pulmonary follow up is as needed

## 2014-06-29 NOTE — Progress Notes (Signed)
PFT done today. 

## 2014-06-29 NOTE — Progress Notes (Signed)
Subjective:    Patient ID: Benjamin Mccall, male    DOB: 17-Sep-1929,   MRN: [id number]    Brief patient profile:  [age] yowm quit smoking at dx of sarcoid at age 89 dx in [place] by surgeon with neck node with cough/sob rx prednisone x at least a decade with chronic doe ever since then x yardwork and leveled off d/cprednisone around age 81 referred to pulmonary 03/27/2014  by Benjamin Mccall with doe s  Cough     History of Present Illness  03/27/2014 1st [facility] office visit/ Benjamin Mccall   Chief Complaint  Patient presents with  . Pulmonary Consult    Referred by Benjamin EkeGreg Calone, NP. Pt states had lung bx age 81 and was dxed with Sarcoid. He recently moved here to [place] from [facility] [place]. He c/o SOB that comes and goes- sometimes gets out of breath when walking the dog and exposed to cold weather.   doe is chronic and more related to how heavy he exerts   Maybe better with symbicort ? Makes him hoarse  Has combivent but doesn't think it helps/ doesn't use it rec Try symbicort 160 one twice daily to see if you don't do just as well    06/29/2014 f/u ov/Benjamin Mccall re: copd GOLD II Chief Complaint  Patient presents with  . Follow-up    PFT done today. Pt states that his breathing seems to be doing some better. He is still using symbicort 2 puffs bid. No new co's today.   better and Not limited by breathing from desired activities      No obvious day to day or daytime variabilty or assoc chronic cough or cp or chest tightness, subjective wheeze overt sinus or hb symptoms. No unusual exp hx or h/o childhood pna/ asthma or knowledge of premature birth.  Sleeping ok without nocturnal  or early am exacerbation  of respiratory  c/o's or need for noct saba. Also denies any obvious fluctuation of symptoms with weather or environmental changes or other aggravating or alleviating factors except as outlined above   Current Medications, Allergies, Complete Past Medical History, Past Surgical History, Family  History, and Social History were reviewed in Benjamin Mccall [facility] electronic medical record.  ROS  The following are not active complaints unless bolded sore throat, dysphagia, dental problems, itching, sneezing,  nasal congestion or excess/ purulent secretions, ear ache,   fever, chills, sweats, unintended wt loss, pleuritic or exertional cp, hemoptysis,  orthopnea pnd or leg swelling, presyncope, palpitations, heartburn, abdominal pain, anorexia, nausea, vomiting, diarrhea  or change in bowel or urinary habits, change in stools or urine, dysuria,hematuria,  rash, arthralgias, visual complaints, headache, numbness weakness or ataxia or problems with walking or coordination,  change in mood/affect or memory.                          Objective:   Physical Exam  amb wm  nad    Wt Readings from Last 3 Encounters:  06/29/14 170 lb (77.111 kg)  04/24/14 165 lb (74.844 kg)  04/03/14 164 lb 1.9 oz (74.444 kg)    Vital signs reviewed     HEENT: nl dentition, turbinates, and orophanx. Nl external ear canals without cough reflex   NECK :  without JVD/Nodes/TM/ nl carotid upstrokes bilaterally   LUNGS: no acc muscle use, clear to A and P bilaterally without cough on insp or exp maneuvers   CV:  RRR  no s3 or  murmur or increase in P2, no edema   ABD:  soft and nontender with nl excursion in the supine position. No bruits or organomegaly, bowel sounds nl  MS:  warm without deformities, calf tenderness, cyanosis or clubbing  SKIN: warm and dry without lesions    NEURO:  alert, approp, no deficits     CXR  03/27/2014 :  No active lung disease. Small calcified mediastinal and hilar nodes consistent with prior granulomatous disease.      Assessment & Plan:

## 2014-07-01 ENCOUNTER — Encounter: Payer: Self-pay | Admitting: Internal Medicine

## 2014-07-01 DIAGNOSIS — J449 Chronic obstructive pulmonary disease, unspecified: Secondary | ICD-10-CM | POA: Insufficient documentation

## 2014-07-01 NOTE — Assessment & Plan Note (Signed)
pfts 06/29/14  FEV1   1.70 (72%) ratio 66 and DLCO 6 corrects to 99    Adequate control on present rx, reviewed > no change in rx needed  = symbicort 160 2bd

## 2014-07-01 NOTE — Assessment & Plan Note (Addendum)
-   dx in 1980's by surgical bx of lymph node in neck and rx x 10 years with prednisone for cough/sob - 03/27/2014  Walked RA x 3 laps @ 185 ft each stopped due to  End of study, no sob or desat @ slow pace - PFT's 06/29/14  VC 65%  DLCO 65 corrects to 99%  There is a mild restrictive component but doubt this has anything at all to do with his sarcoidosis which does not appear active at this point and certainly no indication for systemic rx and pulmonary f/u can be prn

## 2014-07-04 ENCOUNTER — Ambulatory Visit (INDEPENDENT_AMBULATORY_CARE_PROVIDER_SITE_OTHER): Payer: Medicare Other | Admitting: Podiatry

## 2014-07-04 ENCOUNTER — Encounter: Payer: Self-pay | Admitting: Podiatry

## 2014-07-04 VITALS — BP 160/70 | HR 54 | Resp 18

## 2014-07-04 DIAGNOSIS — R200 Anesthesia of skin: Secondary | ICD-10-CM

## 2014-07-04 DIAGNOSIS — M79676 Pain in unspecified toe(s): Secondary | ICD-10-CM

## 2014-07-04 DIAGNOSIS — B351 Tinea unguium: Secondary | ICD-10-CM | POA: Diagnosis not present

## 2014-07-04 NOTE — Progress Notes (Signed)
Patient ID: Manson PasseyDonald R State, male   DOB: [birth year], 81 y.o.   MRN: [id number]  Subjective: 81 y.o.-year-old male returns the office today for painful, elongated, thickened toenails which he is unable to trim himself. Denies any redness or drainage around the nails. He states that he also has some tingling to his feet at night. He denies any pain associated with it any recent injury or trauma. He has a states that over the last several months she has tried notices left foot turning outwards when walking. He has no pain with this. His last set of diabetic shoes for approximate 2 years ago and is inquiring about new shoes. Denies any acute changes since last appointment and no new complaints today. Denies any systemic complaints such as fevers, chills, nausea, vomiting.   Objective: AAO 3, NAD DP/PT pulses palpable, CRT less than 3 seconds Protective sensation intact with Simms Weinstein monofilament, Achilles tendon reflex intact.  Nails hypertrophic, dystrophic, elongated, brittle, discolored 10. There is tenderness over nails 1-5 bilaterally. There is no surrounding erythema or drainage along the nail sites. No open lesions or pre-ulcerative lesions are identified. Radius hammertoe contractures are present bilaterally. There is prominence the metatarsal heads plantarly with atrophy of fat pad. There is no areas of pinpoint bony tenderness or pain with vibratory sensation of bilateral lower extremities. There is no overlying edema, erythema, increase in warmth. There is a decrease in medial arch height upon weightbearing bilaterally.  No pain with calf compression, swelling, warmth, erythema.  Assessment: Patient presents with symptomatic onychomycosis; numbness likely due to biomechanical in nature versus early neuropathy  Plan: -Treatment options including alternatives, risks, complications were discussed -Nails sharply debrided 10 without complication/bleeding. -Discussed daily foot  inspection. If there are any changes, to call the office immediately.  -Prescription for new diabetic shoes and inserts were given the patient for [employer]. -Discussed with him the numbness could likely be due to biomechanical in nature as the numbness is surgically around the toes. Also could be early signs of neuropathy. Will continue to monitor. -Follow-up in 3 months or sooner if any problems are to arise. In the meantime, encouraged to call the office with any questions, concerns, changes symptoms.

## 2014-07-26 ENCOUNTER — Telehealth: Payer: Self-pay | Admitting: Family

## 2014-07-26 ENCOUNTER — Ambulatory Visit (INDEPENDENT_AMBULATORY_CARE_PROVIDER_SITE_OTHER): Payer: Medicare Other | Admitting: Family

## 2014-07-26 ENCOUNTER — Encounter: Payer: Self-pay | Admitting: Family

## 2014-07-26 VITALS — BP 152/64 | HR 52 | Temp 97.9°F | Resp 18 | Ht 66.5 in | Wt 172.0 lb

## 2014-07-26 DIAGNOSIS — R21 Rash and other nonspecific skin eruption: Secondary | ICD-10-CM | POA: Diagnosis not present

## 2014-07-26 DIAGNOSIS — E1165 Type 2 diabetes mellitus with hyperglycemia: Secondary | ICD-10-CM | POA: Diagnosis not present

## 2014-07-26 DIAGNOSIS — J069 Acute upper respiratory infection, unspecified: Secondary | ICD-10-CM | POA: Diagnosis not present

## 2014-07-26 DIAGNOSIS — I10 Essential (primary) hypertension: Secondary | ICD-10-CM

## 2014-07-26 DIAGNOSIS — IMO0002 Reserved for concepts with insufficient information to code with codable children: Secondary | ICD-10-CM

## 2014-07-26 MED ORDER — CEFUROXIME AXETIL 250 MG PO TABS: 250.0000 mg | ORAL_TABLET | Freq: Two times a day (BID) | ORAL | Status: DC

## 2014-07-26 NOTE — Assessment & Plan Note (Signed)
Stable with current regimen. Continue current dosages of diltiazem and hydrochlorothiazide.

## 2014-07-26 NOTE — Telephone Encounter (Signed)
emmi mailed  °

## 2014-07-26 NOTE — Patient Instructions (Signed)
Thank you for choosing  HealthCare.  Summary/Instructions:  Your prescription(s) have been submitted to your pharmacy or been printed and provided for you. Please take as directed and contact our office if you believe you are having problem(s) with the medication(s) or have any questions.  If your symptoms worsen or fail to improve, please contact our office for further instruction, or in case of emergency go directly to the emergency room at the closest medical facility.    Upper Respiratory Infection, Adult An upper respiratory infection (URI) is also sometimes known as the common cold. The upper respiratory tract includes the nose, sinuses, throat, trachea, and bronchi. Bronchi are the airways leading to the lungs. Most people improve within 1 week, but symptoms can last up to 2 weeks. A residual cough may last even longer.  CAUSES Many different viruses can infect the tissues lining the upper respiratory tract. The tissues become irritated and inflamed and often become very moist. Mucus production is also common. A cold is contagious. You can easily spread the virus to others by oral contact. This includes kissing, sharing a glass, coughing, or sneezing. Touching your mouth or nose and then touching a surface, which is then touched by another person, can also spread the virus. SYMPTOMS  Symptoms typically develop 1 to 3 days after you come in contact with a cold virus. Symptoms vary from person to person. They may include:  Runny nose.  Sneezing.  Nasal congestion.  Sinus irritation.  Sore throat.  Loss of voice (laryngitis).  Cough.  Fatigue.  Muscle aches.  Loss of appetite.  Headache.  Low-grade fever. DIAGNOSIS  You might diagnose your own cold based on familiar symptoms, since most people get a cold 2 to 3 times a year. Your caregiver can confirm this based on your exam. Most importantly, your caregiver can check that your symptoms are not due to another  disease such as strep throat, sinusitis, pneumonia, asthma, or epiglottitis. Blood tests, throat tests, and X-rays are not necessary to diagnose a common cold, but they may sometimes be helpful in excluding other more serious diseases. Your caregiver will decide if any further tests are required. RISKS AND COMPLICATIONS  You may be at risk for a more severe case of the common cold if you smoke cigarettes, have chronic heart disease (such as heart failure) or lung disease (such as asthma), or if you have a weakened immune system. The very young and very old are also at risk for more serious infections. Bacterial sinusitis, middle ear infections, and bacterial pneumonia can complicate the common cold. The common cold can worsen asthma and chronic obstructive pulmonary disease (COPD). Sometimes, these complications can require emergency medical care and may be life-threatening. PREVENTION  The best way to protect against getting a cold is to practice good hygiene. Avoid oral or hand contact with people with cold symptoms. Wash your hands often if contact occurs. There is no clear evidence that vitamin C, vitamin E, echinacea, or exercise reduces the chance of developing a cold. However, it is always recommended to get plenty of rest and practice good nutrition. TREATMENT  Treatment is directed at relieving symptoms. There is no cure. Antibiotics are not effective, because the infection is caused by a virus, not by bacteria. Treatment may include:  Increased fluid intake. Sports drinks offer valuable electrolytes, sugars, and fluids.  Breathing heated mist or steam (vaporizer or shower).  Eating chicken soup or other clear broths, and maintaining good nutrition.  Getting plenty of   rest.  Using gargles or lozenges for comfort.  Controlling fevers with ibuprofen or acetaminophen as directed by your caregiver.  Increasing usage of your inhaler if you have asthma. Zinc gel and zinc lozenges, taken in  the first 24 hours of the common cold, can shorten the duration and lessen the severity of symptoms. Pain medicines may help with fever, muscle aches, and throat pain. A variety of non-prescription medicines are available to treat congestion and runny nose. Your caregiver can make recommendations and may suggest nasal or lung inhalers for other symptoms.  HOME CARE INSTRUCTIONS   Only take over-the-counter or prescription medicines for pain, discomfort, or fever as directed by your caregiver.  Use a warm mist humidifier or inhale steam from a shower to increase air moisture. This may keep secretions moist and make it easier to breathe.  Drink enough water and fluids to keep your urine clear or pale yellow.  Rest as needed.  Return to work when your temperature has returned to normal or as your caregiver advises. You may need to stay home longer to avoid infecting others. You can also use a face mask and careful hand washing to prevent spread of the virus. SEEK MEDICAL CARE IF:   After the first few days, you feel you are getting worse rather than better.  You need your caregiver's advice about medicines to control symptoms.  You develop chills, worsening shortness of breath, or brown or red sputum. These may be signs of pneumonia.  You develop yellow or brown nasal discharge or pain in the face, especially when you bend forward. These may be signs of sinusitis.  You develop a fever, swollen neck glands, pain with swallowing, or white areas in the back of your throat. These may be signs of strep throat. SEEK IMMEDIATE MEDICAL CARE IF:   You have a fever.  You develop severe or persistent headache, ear pain, sinus pain, or chest pain.  You develop wheezing, a prolonged cough, cough up blood, or have a change in your usual mucus (if you have chronic lung disease).  You develop sore muscles or a stiff neck. Document Released: 10/07/2000 Document Revised: 07/06/2011 Document Reviewed:  07/19/2013 Exit[employer] Patient Information 2015 [employer]. This information is not intended to replace advice given to you by your health care provider. Make sure you discuss any questions you have with your health care provider.   

## 2014-07-26 NOTE — Progress Notes (Signed)
Pre visit review using our clinic review tool, if applicable. No additional management support is needed unless otherwise documented below in the visit note. 

## 2014-07-26 NOTE — Assessment & Plan Note (Signed)
Symptoms and exam consistent with potential seborrhic keratosis. Refer to dermatology for further assessment.

## 2014-07-26 NOTE — Progress Notes (Signed)
Subjective:    Patient ID: Benjamin Mccall, male    DOB: 19-Sep-1929, 81 y.o.   MRN: [id number]  Chief Complaint  Patient presents with  . Follow-up    Needs form filled out for diabetic shoes and signature on [place] paper    HPI:  Benjamin Mccall is a 81 y.o. male who presents today to follow up.   1) Diabetes -Indicates that his home readings around 100-130 range. Denies any adverse side effects of medications. Indicates that he may have occasional   Lab Results  Component Value Date   HGBA1C 6.5 04/03/2014    2) Hypertension - Stable with current regimen and denies any adverse effects of medication.  Blood pressures from Assisted living facility were reviewed and ranged from the 120s -150s.   BP Readings from Last 3 Encounters:  07/26/14 152/64  07/04/14 160/70  06/29/14 146/76     3) Illness - This is a new problem. Associated symptoms of nasal congestion, sinus pressure, ears feel full, and some mild productive cough. This has been going on for about 3 weeks. Has used some cough drops which helped to relieve his symptoms some.   4) Spot on neck - Associated symptom of a mole located on the left aspect of his neck has been there for several months. Reports it has not changed in size, shape or color. Concern for the moles.   Allergies  Allergen Reactions  . Reglan [Metoclopramide]     Tremors   . Sulfa Antibiotics Other (See Comments)    shakes    Current Outpatient Prescriptions on File Prior to Visit  Medication Sig Dispense Refill  . albuterol (PROVENTIL HFA;VENTOLIN HFA) 108 (90 BASE) MCG/ACT inhaler Inhale 2 puffs into the lungs 2 (two) times daily as needed for wheezing or shortness of breath.    . ALPRAZolam (XANAX) 0.5 MG tablet Take 0.5 mg by mouth at bedtime as needed for anxiety.    [name] aspirin 81 MG tablet Take 81 mg by mouth daily.    [name] atorvastatin (LIPITOR) 20 MG tablet Take 20 mg by mouth daily.    . budesonide-formoterol (SYMBICORT) 160-4.5  MCG/ACT inhaler Inhale 2 puffs into the lungs 2 (two) times daily. 1 Inhaler 0  . Cholecalciferol (VITAMIN D3) 1000 UNITS CAPS Take by mouth daily.    . citalopram (CELEXA) 10 MG tablet Take 10 mg by mouth daily.    . clopidogrel (PLAVIX) 75 MG tablet Take 75 mg by mouth daily.    [name] diltiazem (CARDIZEM CD) 240 MG 24 hr capsule Take 240 mg by mouth daily.    [name] ezetimibe (ZETIA) 10 MG tablet Take 10 mg by mouth daily.    [name] gabapentin (NEURONTIN) 300 MG capsule Take 300 mg by mouth at bedtime.    [name] guaiFENesin (MUCINEX) 600 MG 12 hr tablet Take by mouth 2 (two) times daily.    . hydrochlorothiazide (HYDRODIURIL) 25 MG tablet Take 25 mg by mouth daily.    [name] ibuprofen (ADVIL,MOTRIN) 200 MG tablet Take 200 mg by mouth every 6 (six) hours as needed.    . Ipratropium-Albuterol (COMBIVENT RESPIMAT IN) Inhale into the lungs. 1 puff as needed    . levothyroxine (SYNTHROID, LEVOTHROID) 100 MCG tablet Take 100 mcg by mouth daily.    . metFORMIN (GLUCOPHAGE) 500 MG tablet Take by mouth 2 (two) times daily with a meal.    . Multiple Vitamin (THERA-TABS PO) Take by mouth daily.    . nitroGLYCERIN (NITRODUR - DOSED  IN MG/24 HR) 0.6 mg/hr patch Place 0.6 mg onto the skin daily.    . nitroGLYCERIN (NITROSTAT) 0.4 MG SL tablet Place 0.4 mg under the tongue as needed for chest pain.    . Omega-3 Fatty Acids (FISH OIL) 1200 MG CAPS Take 1 capsule by mouth daily.    [name]. omeprazole (PRILOSEC) 20 MG capsule Take 20 mg by mouth daily.    . polyethylene glycol powder (GLYCOLAX/MIRALAX) powder Take 1 Container by mouth once.    . potassium chloride (MICRO-K) 10 MEQ CR capsule Take 10 mEq by mouth 2 (two) times daily.     No current facility-administered medications on file prior to visit.    Review of Systems  Constitutional: Negative for fever and chills.  HENT: Positive for congestion. Negative for sore throat.   Respiratory: Positive for cough. Negative for chest tightness and shortness of breath.         Objective:    BP 152/64 mmHg  Pulse 52  Temp(Src) 97.9 F (36.6 C) (Oral)  Resp 18  Ht 5' 6.5" (1.689 m)  Wt 172 lb (78.019 kg)  BMI 27.35 kg/m2  SpO2 96% Nursing note and vital signs reviewed.  Physical Exam  Constitutional: He is oriented to person, place, and time. He appears well-developed and well-nourished. No distress.  HENT:  Right Ear: Hearing, tympanic membrane, external ear and ear canal normal.  Left Ear: Hearing, tympanic membrane, external ear and ear canal normal.  Nose: Right sinus exhibits no maxillary sinus tenderness and no frontal sinus tenderness. Left sinus exhibits no maxillary sinus tenderness and no frontal sinus tenderness.  Mouth/Throat: Uvula is midline, oropharynx is clear and moist and mucous membranes are normal.  Neck: Neck supple.  Cardiovascular: Normal rate, regular rhythm, normal heart sounds and intact distal pulses.   Pulmonary/Chest: Effort normal and breath sounds normal.  Lymphadenopathy:    He has no cervical adenopathy.  Neurological: He is alert and oriented to person, place, and time.  Skin: Skin is warm and dry.  Psychiatric: He has a normal mood and affect. His behavior is normal. Judgment and thought content normal.       Assessment & Plan:

## 2014-07-26 NOTE — Assessment & Plan Note (Signed)
Previous A1c was 6.5. Patient indicates his blood sugars are under control. Diabetic foot exam normal. Completed forms for diabetic shoes per podiatry. Continue current dosages of metformin and planned follow-up in 6 months with A1c.

## 2014-07-26 NOTE — Assessment & Plan Note (Signed)
Symptoms and exam consistent with acute upper respiratory infection, however cannot rule out sinusitis. Start Ceftin. Continue over-the-counter medications as needed for symptom relief and supportive care. Follow-up if symptoms worsen or fail to improve.

## 2014-09-06 ENCOUNTER — Encounter (HOSPITAL_COMMUNITY): Payer: Self-pay | Admitting: *Deleted

## 2014-09-06 ENCOUNTER — Emergency Department (HOSPITAL_COMMUNITY)
Admission: EM | Admit: 2014-09-06 | Discharge: 2014-09-06 | Disposition: A | Discharge status: Home / Self Care | Payer: Medicare Other | Attending: Emergency Medicine | Admitting: Emergency Medicine

## 2014-09-06 ENCOUNTER — Emergency Department (HOSPITAL_COMMUNITY): Payer: Medicare Other

## 2014-09-06 DIAGNOSIS — I10 Essential (primary) hypertension: Secondary | ICD-10-CM | POA: Diagnosis not present

## 2014-09-06 DIAGNOSIS — Z7951 Long term (current) use of inhaled steroids: Secondary | ICD-10-CM | POA: Insufficient documentation

## 2014-09-06 DIAGNOSIS — E785 Hyperlipidemia, unspecified: Secondary | ICD-10-CM | POA: Diagnosis not present

## 2014-09-06 DIAGNOSIS — I4891 Unspecified atrial fibrillation: Secondary | ICD-10-CM | POA: Diagnosis not present

## 2014-09-06 DIAGNOSIS — Z87891 Personal history of nicotine dependence: Secondary | ICD-10-CM | POA: Insufficient documentation

## 2014-09-06 DIAGNOSIS — J45901 Unspecified asthma with (acute) exacerbation: Secondary | ICD-10-CM | POA: Diagnosis not present

## 2014-09-06 DIAGNOSIS — E039 Hypothyroidism, unspecified: Secondary | ICD-10-CM | POA: Insufficient documentation

## 2014-09-06 DIAGNOSIS — R0600 Dyspnea, unspecified: Secondary | ICD-10-CM

## 2014-09-06 DIAGNOSIS — Z7982 Long term (current) use of aspirin: Secondary | ICD-10-CM | POA: Insufficient documentation

## 2014-09-06 DIAGNOSIS — R0602 Shortness of breath: Secondary | ICD-10-CM | POA: Diagnosis present

## 2014-09-06 DIAGNOSIS — K219 Gastro-esophageal reflux disease without esophagitis: Secondary | ICD-10-CM | POA: Insufficient documentation

## 2014-09-06 DIAGNOSIS — E119 Type 2 diabetes mellitus without complications: Secondary | ICD-10-CM | POA: Insufficient documentation

## 2014-09-06 DIAGNOSIS — Z79899 Other long term (current) drug therapy: Secondary | ICD-10-CM | POA: Diagnosis not present

## 2014-09-06 DIAGNOSIS — Z7902 Long term (current) use of antithrombotics/antiplatelets: Secondary | ICD-10-CM | POA: Diagnosis not present

## 2014-09-06 DIAGNOSIS — R202 Paresthesia of skin: Secondary | ICD-10-CM | POA: Diagnosis not present

## 2014-09-06 LAB — BASIC METABOLIC PANEL
Anion gap: 9 (ref 5–15)
BUN: 25 mg/dL — ABNORMAL HIGH (ref 6–20)
CO2: 26 mmol/L (ref 22–32)
Calcium: 9.3 mg/dL (ref 8.9–10.3)
Chloride: 99 mmol/L — ABNORMAL LOW (ref 101–111)
Creatinine, Ser: 1.28 mg/dL — ABNORMAL HIGH (ref 0.61–1.24)
GFR calc Af Amer: 58 mL/min — ABNORMAL LOW (ref >60)
GFR calc non Af Amer: 50 mL/min — ABNORMAL LOW (ref >60)
Glucose, Bld: 144 mg/dL — ABNORMAL HIGH (ref 65–99)
Potassium: 3.8 mmol/L (ref 3.5–5.1)
Sodium: 134 mmol/L — ABNORMAL LOW (ref 135–145)

## 2014-09-06 LAB — CBC WITH DIFFERENTIAL/PLATELET
Basophils Absolute: 0 10*3/uL (ref 0.0–0.1)
Basophils Relative: 0 % (ref 0–1)
Eosinophils Absolute: 0.1 10*3/uL (ref 0.0–0.7)
Eosinophils Relative: 1 % (ref 0–5)
HCT: 35.2 % — ABNORMAL LOW (ref 39.0–52.0)
Hemoglobin: 11.7 g/dL — ABNORMAL LOW (ref 13.0–17.0)
Lymphocytes Relative: 21 % (ref 12–46)
Lymphs Abs: 1.8 10*3/uL (ref 0.7–4.0)
MCH: 28.5 pg (ref 26.0–34.0)
MCHC: 33.2 g/dL (ref 30.0–36.0)
MCV: 85.9 fL (ref 78.0–100.0)
Monocytes Absolute: 0.4 10*3/uL (ref 0.1–1.0)
Monocytes Relative: 5 % (ref 3–12)
Neutro Abs: 6 10*3/uL (ref 1.7–7.7)
Neutrophils Relative %: 73 % (ref 43–77)
Platelets: 223 10*3/uL (ref 150–400)
RBC: 4.1 MIL/uL — ABNORMAL LOW (ref 4.22–5.81)
RDW: 13.6 % (ref 11.5–15.5)
WBC: 8.3 10*3/uL (ref 4.0–10.5)

## 2014-09-06 LAB — TROPONIN I
Troponin I: <0.03 ng/mL (ref <0.031)
Troponin I: <0.03 ng/mL (ref <0.031)

## 2014-09-06 NOTE — ED Notes (Signed)
Family at bedside. 

## 2014-09-06 NOTE — ED Provider Notes (Signed)
CSN: [id number]     Arrival date & time 09/06/14  1630 History   First MD Initiated Contact with Patient 09/06/14 1632     Chief Complaint  Patient presents with  . Shortness of Breath     (Consider location/radiation/quality/duration/timing/severity/associated sxs/prior Treatment) HPI   84yM with tingling sensation. Intermittent. Feels wave of tingling from b/l knee to costal margin. Lasts about a minute. No appreciable exacerbating or relieving factors. Hx of afib and apparently in afib with rvr for EMS initially which spontaneously converted. Pt denies similar symptoms when in afib previously. Denies any pain anywhere. Mild shortness of breath. No fevers or chills. No recent medication changes.  Past Medical History  Diagnosis Date  . Diabetes mellitus without complication   . GERD (gastroesophageal reflux disease)   . Hyperlipidemia   . Hypertension   . Hypothyroid   . Chest pain   . Atrial fibrillation   . Asthma    Past Surgical History  Procedure Laterality Date  . Percutaneous coronary stent intervention (pci-s)    . Hiatal hernia repair    . Cataract extraction    . Appendectomy     Family History  Problem Relation Age of Onset  . Hypertension Mother   . Stroke Mother   . Ovarian cancer Mother   . Stroke Father   . Diabetes Son    History  Substance Use Topics  . Smoking status: Former Smoker -- 0.50 packs/day for 7 years    Types: Pipe, Cigars    Quit date: 04/27/1968  . Smokeless tobacco: Never Used  . Alcohol Use: No    Review of Systems  All systems reviewed and negative, other than as noted in HPI.   Allergies  Reglan and Sulfa antibiotics  Home Medications   Prior to Admission medications   Medication Sig Start Date End Date Taking? Authorizing Provider  albuterol (PROVENTIL HFA;VENTOLIN HFA) 108 (90 BASE) MCG/ACT inhaler Inhale 2 puffs into the lungs 2 (two) times daily as needed for wheezing or shortness of breath.    Historical Provider,  MD  ALPRAZolam Prudy Feeler(XANAX) 0.5 MG tablet Take 0.5 mg by mouth at bedtime as needed for anxiety.    Historical Provider, MD  aspirin 81 MG tablet Take 81 mg by mouth daily.    Historical Provider, MD  atorvastatin (LIPITOR) 20 MG tablet Take 20 mg by mouth daily.    Historical Provider, MD  budesonide-formoterol (SYMBICORT) 160-4.5 MCG/ACT inhaler Inhale 2 puffs into the lungs 2 (two) times daily. 03/27/14   Nyoka CowdenMichael B Wert, MD  cefUROXime (CEFTIN) 250 MG tablet Take 1 tablet (250 mg total) by mouth 2 (two) times daily with a meal. 07/26/14   Veryl SpeakGregory D Calone, FNP  Cholecalciferol (VITAMIN D3) 1000 UNITS CAPS Take by mouth daily.    Historical Provider, MD  citalopram (CELEXA) 10 MG tablet Take 10 mg by mouth daily.    Historical Provider, MD  clopidogrel (PLAVIX) 75 MG tablet Take 75 mg by mouth daily.    Historical Provider, MD  diltiazem (CARDIZEM CD) 240 MG 24 hr capsule Take 240 mg by mouth daily.    Historical Provider, MD  ezetimibe (ZETIA) 10 MG tablet Take 10 mg by mouth daily.    Historical Provider, MD  gabapentin (NEURONTIN) 300 MG capsule Take 300 mg by mouth at bedtime.    Historical Provider, MD  guaiFENesin (MUCINEX) 600 MG 12 hr tablet Take by mouth 2 (two) times daily.    Historical Provider, MD  hydrochlorothiazide (HYDRODIURIL)  25 MG tablet Take 25 mg by mouth daily.    Historical Provider, MD  ibuprofen (ADVIL,MOTRIN) 200 MG tablet Take 200 mg by mouth every 6 (six) hours as needed.    Historical Provider, MD  Ipratropium-Albuterol (COMBIVENT RESPIMAT IN) Inhale into the lungs. 1 puff as needed    Historical Provider, MD  levothyroxine (SYNTHROID, LEVOTHROID) 100 MCG tablet Take 100 mcg by mouth daily.    Historical Provider, MD  metFORMIN (GLUCOPHAGE) 500 MG tablet Take by mouth 2 (two) times daily with a meal.    Historical Provider, MD  Multiple Vitamin (THERA-TABS PO) Take by mouth daily.    Historical Provider, MD  nitroGLYCERIN (NITRODUR - DOSED IN MG/24 HR) 0.6 mg/hr patch  Place 0.6 mg onto the skin daily.    Historical Provider, MD  nitroGLYCERIN (NITROSTAT) 0.4 MG SL tablet Place 0.4 mg under the tongue as needed for chest pain.    Historical Provider, MD  Omega-3 Fatty Acids (FISH OIL) 1200 MG CAPS Take 1 capsule by mouth daily.    Historical Provider, MD  omeprazole (PRILOSEC) 20 MG capsule Take 20 mg by mouth daily.    Historical Provider, MD  polyethylene glycol powder (GLYCOLAX/MIRALAX) powder Take 1 Container by mouth once.    Historical Provider, MD  potassium chloride (MICRO-K) 10 MEQ CR capsule Take 10 mEq by mouth 2 (two) times daily.    Historical Provider, MD   BP 161/57 mmHg  Pulse 90  Temp(Src) 98.5 F (36.9 C) (Oral)  Resp 22  SpO2 97% Physical Exam  Constitutional: He appears well-developed and well-nourished. No distress.  HENT:  Head: Normocephalic and atraumatic.  Eyes: Conjunctivae are normal. Right eye exhibits no discharge. Left eye exhibits no discharge.  Neck: Neck supple.  Cardiovascular: Normal rate, regular rhythm and normal heart sounds.  Exam reveals no gallop and no friction rub.   No murmur heard. Pulmonary/Chest: Effort normal and breath sounds normal. No respiratory distress.  Abdominal: Soft. He exhibits no distension. There is no tenderness.  Musculoskeletal: He exhibits no edema or tenderness.  Lower extremities symmetric as compared to each other. No calf tenderness. Negative Homan's. No palpable cords.   Neurological: He is alert.  Skin: Skin is warm and dry.  Psychiatric: He has a normal mood and affect. His behavior is normal. Thought content normal.  Nursing note and vitals reviewed.   ED Course  Procedures (including critical care time) Labs Review Labs Reviewed  CBC WITH DIFFERENTIAL/PLATELET - Abnormal; Notable for the following:    RBC 4.10 (*)    Hemoglobin 11.7 (*)    HCT 35.2 (*)    All other components within normal limits  BASIC METABOLIC PANEL  TROPONIN I    Imaging Review Dg Chest 2  View  09/06/2014   CLINICAL DATA:  Shortness of Breath  EXAM: CHEST  2 VIEW  COMPARISON:  03/27/2014  FINDINGS: Cardiac shadow is within normal limits. The lungs are well aerated bilaterally. Mild degenerative change of the thoracic spine is again noted.  IMPRESSION: No acute abnormality noted.   Electronically Signed   By: Alcide CleverMark  Lukens M.D.   On: 09/06/2014 17:36     EKG Interpretation None      EKG:  Rhythm: sinus bradycardia Vent. rate 54 BPM PR interval 243 ms QRS duration 103 ms QT/QTc 460/436 ms ST segments: NS ST changes    MDM   Final diagnoses:  Dyspnea  Tingling    It has been determined that no acute conditions requiring  further emergency intervention are present at this time. The patient has been advised of the diagnosis and plan. I reviewed any labs and imaging including any potential incidental findings. We have discussed signs and symptoms that warrant return to the ED and they are listed in the discharge instructions.      Raeford Razor, MD 09/12/14 [phone]

## 2014-09-06 NOTE — Discharge Instructions (Signed)

## 2014-09-06 NOTE — ED Notes (Signed)
Pt ambulated to restroom with standby assistance, tolerated well.  No SOB, CP.

## 2014-09-06 NOTE — ED Notes (Signed)
Pt. Is going to x-ray. 

## 2014-09-06 NOTE — ED Notes (Addendum)
Pt presents from home via [facility] c/o SOB.  Pt was in Afib when EMS arrived, chronic.  Pt converted on his own en route.  Now no c/o of SOB or CP.  Pt a x 4, NAD.  Pt takes cardizem and plavix.  Nitro patch on chest on arrival, pt puts on daily.  BP-176/58 P-55 R-18 CBG-191.

## 2014-09-06 NOTE — ED Notes (Signed)
Pt. Is back from x-ray

## 2014-09-06 NOTE — ED Notes (Signed)
MD at bedside. 

## 2014-09-07 ENCOUNTER — Telehealth: Payer: Self-pay | Admitting: Internal Medicine

## 2014-09-07 ENCOUNTER — Ambulatory Visit (INDEPENDENT_AMBULATORY_CARE_PROVIDER_SITE_OTHER): Payer: Medicare Other | Admitting: Adult Health

## 2014-09-07 ENCOUNTER — Telehealth: Payer: Self-pay | Admitting: Cardiovascular Disease

## 2014-09-07 ENCOUNTER — Encounter: Payer: Self-pay | Admitting: Adult Health

## 2014-09-07 VITALS — BP 110/80 | HR 50 | Temp 98.1°F

## 2014-09-07 DIAGNOSIS — J449 Chronic obstructive pulmonary disease, unspecified: Secondary | ICD-10-CM

## 2014-09-07 MED ORDER — PREDNISONE 10 MG PO TABS: ORAL_TABLET | ORAL | Status: DC

## 2014-09-07 NOTE — Assessment & Plan Note (Signed)
Mild flare with allergic rhinitis Emergency room evaluation unrevealing with negative chest x-ray and troponin  Plan   Prednisone 20mg  daily for 5 days then 10mg  daily for 5 days and stop .  Mucinex DM Twice daily  As needed  Cough/congestion  Claritin 10mg  At bedtime  As needed  Drainage Saline nasal rinses As needed   Flonase 2 puff daily As needed   Please contact office for sooner follow up if symptoms do not improve or worsen or seek emergency care  follow up Dr. Sherene SiresWert  In 3 months and As needed

## 2014-09-07 NOTE — Patient Instructions (Signed)
Prednisone 20mg  daily for 5 days then 10mg  daily for 5 days and stop .  Mucinex DM Twice daily  As needed  Cough/congestion  Claritin 10mg  At bedtime  As needed  Drainage Saline nasal rinses As needed   Flonase 2 puff daily As needed   Please contact office for sooner follow up if symptoms do not improve or worsen or seek emergency care  follow up Dr. Sherene SiresWert  In 3 months and As needed

## 2014-09-07 NOTE — Telephone Encounter (Signed)
Nurse Allena Earing([place]) from [facility] calling stating that Mr. Benjamin Mccall is c/o of SOB and feeling flushed.  Yesterday he was c/o of numbness and tingling in chest and SOB.  They called EMS and was transported to ER.  ER visit showed his EKG to be sinus bradycardia with HR 54; CXR showed no acute abnormality.  There were no changes in his medications.  Nurse states BP today is (R) 145/74 HR 50; (L) 143/77 HR 50.  No other symptoms. Is not experiencing the numbness today in chest.  Spoke w/Chris Berge,NP who suggests that he call Dr. Sherene SiresWert his pulmonary physician for further evaluation. Gave the nurse the phone number to [facility].

## 2014-09-07 NOTE — Telephone Encounter (Signed)
Pt has been scheduled to see TP today at 2:45pm. Nothing further was needed. 

## 2014-09-07 NOTE — Telephone Encounter (Signed)
Pt c/o Shortness Of Breath: STAT if SOB developed within the last 24 hours or pt is noticeably SOB on the phone  1. Are you currently SOB (can you hear that pt is SOB on the phone)? Yes  2. How long have you been experiencing SOB? yesterday  3. Are you SOB when sitting or when up moving around? both  4. Are you currently experiencing any other symptoms? Numbness, tingling from waist up, pt feels flush

## 2014-09-07 NOTE — Progress Notes (Signed)
Subjective:    Patient ID: Benjamin Mccall, male    DOB: 18-Jul-1929,   MRN: [id number]    Brief patient profile:  89 yowm quit smoking at dx of sarcoid at age 81 dx in [place] by surgeon with neck node with cough/sob rx prednisone x at least a decade with chronic doe ever since then x yardwork and leveled off d/cprednisone around age 81 referred to pulmonary 03/27/2014  by [facility] primary care with doe s  Cough     History of Present Illness  03/27/2014 1st [facility] office visit/ Wert   Chief Complaint  Patient presents with  . Pulmonary Consult    Referred by [name], NP. Pt states had lung bx age 81 and was dxed with Sarcoid. [place] He c/o SOB that comes and goes- sometimes gets out of breath when walking the dog and exposed to cold weather.   doe is chronic and more related to how heavy he exerts   Maybe better with symbicort ? Makes him hoarse  Has combivent but doesn't think it helps/ doesn't use it rec Try symbicort 160 one twice daily to see if you don't do just as well    06/29/2014 f/u ov/Wert re: copd GOLD II Chief Complaint  Patient presents with  . Follow-up    PFT done today. Pt states that his breathing seems to be doing some better. He is still using symbicort 2 puffs bid. No new co's today.   better and Not limited by breathing from desired activities      >>symbicort  09/07/2014  ER follow up  Patient returns for a emergency room follow-up Patient says that he went to the emergency room yesterday after feeling more short of breath over the last few days He has had more shortness of breath with activity He denies any chest pain, syncope, orthopnea, PND, or increased leg swelling Has had minimum dry cough, sinus drainage. Chest x-ray at the emergency room was negative Troponin was negative. Patient denies any fever or discolored mucus. Says he has felt weak.  No visual speech changes.  No ext weakness       Current Medications, Allergies, Complete Past Medical History, Past Surgical History, Family History, and Social History were reviewed in [facility] electronic medical record.  ROS  The following are not active complaints unless bolded sore throat, dysphagia, dental problems, itching, sneezing,    ear ache,   fever, chills, sweats, unintended wt loss, pleuritic or exertional cp, hemoptysis,  orthopnea pnd or leg swelling, presyncope, palpitations, heartburn, abdominal pain, anorexia, nausea, vomiting, diarrhea  or change in bowel or urinary habits, change in stools or urine, dysuria,hematuria,  rash, arthralgias, visual complaints, headache, numbness weakness or ataxia or problems with walking or coordination,  change in mood/affect or memory.                          Objective:   Physical Exam  amb wm  nad  Vital signs reviewed     HEENT: nl dentition, turbinates, and orophanx. Nl external ear canals without cough reflex   NECK :  without JVD/Nodes/TM/ nl carotid upstrokes bilaterally   LUNGS: no acc muscle use, clear to A and P bilaterally without cough on insp or exp maneuvers   CV:  RRR  no s3 or murmur or increase in P2, no edema   ABD:  soft and nontender with nl  excursion in the supine position. No bruits or organomegaly, bowel sounds nl  MS:  warm without deformities, calf tenderness, cyanosis or clubbing  SKIN: warm and dry without lesions    NEURO:  alert, approp, no deficits noted.      CXR   5.12.16 nad    Assessment & Plan:

## 2014-09-07 NOTE — Progress Notes (Signed)
Chart and xrays reviewed/ office note reviewed and agree with a/p

## 2014-10-05 ENCOUNTER — Ambulatory Visit: Payer: Medicare Other | Admitting: Podiatry

## 2014-10-11 ENCOUNTER — Ambulatory Visit: Payer: Medicare Other | Admitting: Podiatry

## 2014-10-30 ENCOUNTER — Ambulatory Visit (INDEPENDENT_AMBULATORY_CARE_PROVIDER_SITE_OTHER): Payer: Medicare Other | Admitting: Podiatry

## 2014-10-30 ENCOUNTER — Encounter: Payer: Self-pay | Admitting: Podiatry

## 2014-10-30 DIAGNOSIS — B351 Tinea unguium: Secondary | ICD-10-CM | POA: Diagnosis not present

## 2014-10-30 DIAGNOSIS — E119 Type 2 diabetes mellitus without complications: Secondary | ICD-10-CM | POA: Diagnosis not present

## 2014-10-30 DIAGNOSIS — M79676 Pain in unspecified toe(s): Secondary | ICD-10-CM

## 2014-10-30 NOTE — Progress Notes (Signed)
Patient ID: Benjamin PasseyDonald R Broyles, male   DOB: 08-09-1929, 81 y.o.   MRN: [id number] Complaint:  Visit Type: Patient returns to my office for continued preventative foot care services. Complaint: Patient states" my nails have grown long and thick and become painful to walk and wear shoes" Patient has been diagnosed with DM with no complications. He presents for preventative foot care services. No changes to ROS  Podiatric Exam: Vascular: dorsalis pedis and posterior tibial pulses are palpable bilateral. Capillary return is immediate. Temperature gradient is WNL. Skin turgor WNL  Sensorium: Normal Semmes Weinstein monofilament test. Normal tactile sensation bilaterally. Nail Exam: Pt has thick disfigured discolored nails with subungual debris noted bilateral entire nail hallux through fifth toenails Ulcer Exam: There is no evidence of ulcer or pre-ulcerative changes or infection. Orthopedic Exam: Muscle tone and strength are WNL. No limitations in general ROM. No crepitus or effusions noted. Foot type and digits show no abnormalities. Bony prominences are unremarkable. Skin: No Porokeratosis. No infection or ulcers  Diagnosis:  Tinea unguium, Pain in right toe, pain in left toes  Treatment & Plan Procedures and Treatment: Consent by patient was obtained for treatment procedures. The patient understood the discussion of treatment and procedures well. All questions were answered thoroughly reviewed. Debridement of mycotic and hypertrophic toenails, 1 through 5 bilateral and clearing of subungual debris. No ulceration, no infection noted.  Return Visit-Office Procedure: Patient instructed to return to the office for a follow up visit 3 months for continued evaluation and treatment.

## 2014-11-02 ENCOUNTER — Ambulatory Visit: Payer: Medicare Other | Admitting: Cardiovascular Disease

## 2014-11-07 NOTE — Progress Notes (Signed)
Patient ID: Benjamin Mccall, male   DOB: May 17, 1929, 81 y.o.   MRN: [id number]     81 y.o.  diabetic referred for cardiovascular f/u History of HTN and chronic sarcoid seen by Dr Johnnette Litter Dr Filbert Schilder in [place].  Distant history of CAD with stents more then 10 years ago Had angina at that time and none recently.  ? PAF but in SR now and not on anticoagulation.  He still drives and gets around well outside of orthopedic issues. Mild exertional dyspnea no chest pain palpitations , syncope or TIA's.  Compliant with meds Lives at [facility] with wife of 50 years although she has recently been at [facility] for rehab from stroke.  Son with him today and seems invested in his care.  Mild chronic LE edema on diuretic   Seen recently by pulmonary for dyspnea and had steroid taper.  CXR unrevealing and reviewed   Echo 04/25/14 reviewed  Study Conclusions  - Left ventricle: Wall thickness was increased in a pattern of mild LVH. There was mild focal basal hypertrophy of the septum. Systolic function was normal. The estimated ejection fraction was in the range of 55% to 60%. Wall motion was normal; there were no regional wall motion abnormalities. Doppler parameters are consistent with abnormal left ventricular relaxation (grade 1 diastolic dysfunction). - Left atrium: The atrium was mildly dilated.  At [facility] Wife recovering from stroke   ROS: Denies fever, malais, weight loss, blurry vision, decreased visual acuity, cough, sputum, SOB, hemoptysis, pleuritic pain, palpitaitons, heartburn, abdominal pain, melena, lower extremity edema, claudication, or rash.  All other systems reviewed and negative   General: Affect appropriate Frail elderly male  HEENT: normal Neck supple with no adenopathy JVP normal no bruits no thyromegaly Lungs clear with no wheezing and good diaphragmatic motion Heart:  S1/S2 systolic ejection  murmur,rub, gallop or click PMI normal Abdomen: benighn, BS  positve, no tenderness, no AAA no bruit.  No HSM or HJR Distal pulses intact with no bruits Plus one bilateral edema with varicosities  Neuro non-focal Skin warm and dry No muscular weakness  Medications Current Outpatient Prescriptions  Medication Sig Dispense Refill  . albuterol (PROVENTIL HFA;VENTOLIN HFA) 108 (90 BASE) MCG/ACT inhaler Inhale 2 puffs into the lungs 2 (two) times daily as needed for wheezing or shortness of breath.    . ALPRAZolam (XANAX) 0.5 MG tablet Take 0.5 mg by mouth at bedtime as needed for anxiety.    [name] aspirin 81 MG tablet Take 81 mg by mouth daily.    [name] atorvastatin (LIPITOR) 20 MG tablet Take 20 mg by mouth daily.    . budesonide-formoterol (SYMBICORT) 160-4.5 MCG/ACT inhaler Inhale 2 puffs into the lungs 2 (two) times daily. 1 Inhaler 0  . Cholecalciferol (VITAMIN D3) 1000 UNITS CAPS Take by mouth daily.    . citalopram (CELEXA) 10 MG tablet Take 10 mg by mouth daily.    . clopidogrel (PLAVIX) 75 MG tablet Take 75 mg by mouth daily.    [name] diltiazem (CARDIZEM CD) 240 MG 24 hr capsule Take 240 mg by mouth daily.    [name] ezetimibe (ZETIA) 10 MG tablet Take 10 mg by mouth daily.    [name] Fexofenadine HCl (MUCINEX ALLERGY PO) Take 600 mg by mouth as needed.    . fluticasone (FLONASE) 50 MCG/ACT nasal spray Place 2 sprays into both nostrils as needed for allergies or rhinitis.    [name] gabapentin (NEURONTIN) 300 MG capsule Take 300 mg by mouth at bedtime.    [name]  guaiFENesin (MUCINEX) 600 MG 12 hr tablet Take by mouth 2 (two) times daily.    . hydrochlorothiazide (HYDRODIURIL) 25 MG tablet Take 25 mg by mouth daily.    [name] ibuprofen (ADVIL,MOTRIN) 200 MG tablet Take 200 mg by mouth every 6 (six) hours as needed.    . Ipratropium-Albuterol (COMBIVENT RESPIMAT IN) Inhale into the lungs. 1 puff as needed    . levothyroxine (SYNTHROID, LEVOTHROID) 100 MCG tablet Take 100 mcg by mouth daily.    [name] loratadine (CLARITIN) 10 MG tablet Take 10 mg by mouth daily as needed for allergies.      . metFORMIN (GLUCOPHAGE) 500 MG tablet Take by mouth 2 (two) times daily with a meal.    . Multiple Vitamin (THERA-TABS PO) Take by mouth daily.    . nitroGLYCERIN (NITRODUR - DOSED IN MG/24 HR) 0.6 mg/hr patch Place 0.6 mg onto the skin daily.    . nitroGLYCERIN (NITROSTAT) 0.4 MG SL tablet Place 0.4 mg under the tongue as needed for chest pain.    . Omega-3 Fatty Acids (FISH OIL) 1200 MG CAPS Take 1 capsule by mouth daily.    [name] omeprazole (PRILOSEC) 20 MG capsule Take 20 mg by mouth daily.    . polyethylene glycol powder (GLYCOLAX/MIRALAX) powder Take 1 Container by mouth once.    . potassium chloride (MICRO-K) 10 MEQ CR capsule Take 10 mEq by mouth 2 (two) times daily.    . sodium chloride (DEEP SEA NASAL SPRAY) 0.65 % nasal spray Place 1 spray into the nose as needed for congestion.     No current facility-administered medications for this visit.    Allergies Reglan and Sulfa antibiotics  Family History: Family History  Problem Relation Age of Onset  . Hypertension Mother   . Stroke Mother   . Ovarian cancer Mother   . Stroke Father   . Diabetes Son     Social History: History   Social History  . Marital Status: Married    Spouse Name: N/A  . Number of Children: 2  . Years of Education: 12   Occupational History  . 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 Smoking status: Former Smoker -- 0.50 packs/day for 7 years    Types: Pipe, Cigars    Quit date: 04/27/1968  . Smokeless tobacco: Never Used  . Alcohol Use: No  . Drug Use: No  . Sexual Activity: Not on file   Other Topics Concern  . Not on file   Social History Narrative   Born and raised in [place]. Residing in [facility] with his wife. Denies any beliefs effecting healthcare.     Past Surgical History  Procedure Laterality Date  . Percutaneous coronary stent intervention (pci-s)    . Hiatal hernia repair    . Cataract extraction    .  Appendectomy      Past Medical History  Diagnosis Date  . Diabetes mellitus without complication   . GERD (gastroesophageal reflux disease)   . Hyperlipidemia   . Hypertension   . Hypothyroid   . Chest pain   . Atrial fibrillation   . Asthma     Electrocardiogram:  09/07/14   SR rate 51  LAD PR 230  LVH   Assessment and Plan CAD" Stable with no angina and good activity level.  Continue medical Rx  PAF: maint NSR with no palpitations   HTN:  Well controlled.  Continue current medications and low sodium Dash type diet.    Sarcoid:  Mild wheezing has inhaler f/u primary /pulmonary  Dyspnea  Related to asthma / sarcoid EF normal no edema   First Degree Block :  F/u ECG in a year no high grade AV block

## 2014-11-12 ENCOUNTER — Encounter: Payer: Self-pay | Admitting: Cardiovascular Disease

## 2014-11-12 ENCOUNTER — Ambulatory Visit (INDEPENDENT_AMBULATORY_CARE_PROVIDER_SITE_OTHER): Payer: Medicare Other | Admitting: Cardiovascular Disease

## 2014-11-12 VITALS — BP 132/56 | HR 58 | Ht 68.0 in | Wt 168.1 lb

## 2014-11-12 DIAGNOSIS — I2583 Coronary atherosclerosis due to lipid rich plaque: Principal | ICD-10-CM

## 2014-11-12 DIAGNOSIS — I2510 Atherosclerotic heart disease of native coronary artery without angina pectoris: Secondary | ICD-10-CM

## 2014-11-12 NOTE — Patient Instructions (Signed)
Medication Instructions:  NO CHANGES  Labwork: NONE  Testing/Procedures: NONE  Follow-Up: Your physician wants you to follow-up in: YEAR WITH  DR NISHAN You will receive a reminder letter in the mail two months in advance. If you don't receive a letter, please call our office to schedule the follow-up appointment.   Any Other Special Instructions Will Be Listed Below (If Applicable).   

## 2014-12-25 ENCOUNTER — Encounter: Payer: Self-pay | Admitting: Internal Medicine

## 2014-12-25 ENCOUNTER — Ambulatory Visit (INDEPENDENT_AMBULATORY_CARE_PROVIDER_SITE_OTHER): Payer: Medicare Other | Admitting: Internal Medicine

## 2014-12-25 VITALS — BP 118/64 | HR 53 | Ht 68.0 in | Wt 174.0 lb

## 2014-12-25 DIAGNOSIS — J449 Chronic obstructive pulmonary disease, unspecified: Secondary | ICD-10-CM | POA: Diagnosis not present

## 2014-12-25 DIAGNOSIS — I2510 Atherosclerotic heart disease of native coronary artery without angina pectoris: Secondary | ICD-10-CM

## 2014-12-25 DIAGNOSIS — R05 Cough: Secondary | ICD-10-CM | POA: Diagnosis not present

## 2014-12-25 DIAGNOSIS — R058 Other specified cough: Secondary | ICD-10-CM

## 2014-12-25 NOTE — Patient Instructions (Signed)
Try reduce symbicort to 160 one twice daily   Add prilosec 20 mg Take 30-60 min before first meal of the day   GERD (REFLUX)  is an extremely common cause of respiratory symptoms just like yours , many times with no obvious heartburn at all.    It can be treated with medication, but also with lifestyle changes including elevation of the head of your bed (ideally with 6 inch  bed blocks),  Smoking cessation, avoidance of late meals, excessive alcohol, and avoid fatty foods, chocolate, peppermint, colas, red wine, and acidic juices such as orange juice.  NO MINT OR MENTHOL PRODUCTS SO NO COUGH DROPS  USE SUGARLESS CANDY INSTEAD (Jolley ranchers or Stover's or Life Savers) or even ice chips will also do - the key is to swallow to prevent all throat clearing. NO OIL BASED VITAMINS - use powdered substitutes.    Please schedule a follow up office visit in 4 weeks, sooner if needed

## 2014-12-25 NOTE — Progress Notes (Signed)
Subjective:    Patient ID: Benjamin Mccall, male    DOB: [birth year]    MRN: [id number]    Brief patient profile:  81 yowm quit smoking at dx of sarcoid at age 81 dx in [place] by surgeon with neck node with cough/sob rx prednisone x at least a decade with chronic doe ever since then x yardwork and leveled off d/c prednisone around age 52 referred to pulmonary 03/27/2014  by [facility] primary care with doe s  Cough     History of Present Illness  03/27/2014 1st [place] office visit/ W[facility]   Chief Complaint  Patient presents with  . Pulmonary Consult    Referred by Marcos Eke, NP. Pt states had lung bx age 55 and was dxed with Sarcoid. He recently moved here to [place] from [facility]. He c/o SOB that comes and goes- sometimes gets out of breath when walking the dog and exposed to cold weather.   doe is chronic and more related to how heavy he exerts   Maybe better with symbicort ? Makes him hoarse  Has combivent but doesn't think it helps/ doesn't use it rec Try symbicort 160 one twice daily to see if you don't do just as well    06/29/2014 f/u ov/Wert re: copd GOLD II Chief Complaint  Patient presents with  . Follow-up    PFT done today. Pt states that his breathing seems to be doing some better. He is still using symbicort 2 puffs bid. No new co's today.   better and Not limited by breathing from desired activities      >>symbicort  09/07/2014  ER follow up  Patient returns for a emergency room follow-up Patient says that he went to the emergency room yesterday after feeling more short of breath over the last few days He has had more shortness of breath with activity He denies any chest pain, syncope, orthopnea, PND, or increased leg swelling Has had minimum dry cough, sinus drainage. Chest x-ray at the emergency room was negative Troponin was negative. rec Prednisone 20mg  daily for 5 days then 10mg  daily for 5 days and stop .  Mucinex DM Twice daily  As needed   Cough/congestion  Claritin 10mg  At bedtime  As needed  Drainage Saline nasal rinses As needed   Flonase 2 puff daily As needed      12/25/2014 f/u ov/Wert re: GOLD II copd/ on symbicort 160 2bid  Chief Complaint  Patient presents with  . Follow-up    Pt states his breathing is unchanged. He c/o "a lot of congestion from my sinuses"- has prod cough with minimal grey sputum.    can walk around complex s stopping x 45 min including some hills  Cough mostly daytime on prn prilosec worse with meals - sleeps ok and no need for saba but wants access to it in case he does (lives in assisted living and has to ask for it)    No obvious day to day or daytime variability or assoc  cp or chest tightness, subjective wheeze or overt sinus or hb symptoms. No unusual exp hx or h/o childhood pna/ asthma or knowledge of premature birth.  Sleeping ok without nocturnal  or early am exacerbation  of respiratory  c/o's or need for noct saba. Also denies any obvious fluctuation of symptoms with weather or environmental changes or other aggravating or alleviating factors except as outlined above   Current Medications, Allergies, Complete Past Medical History, Past Surgical History,  Family History, and Social History were reviewed in [employer] record.  ROS  The following are not active complaints unless bolded sore throat, dysphagia, dental problems, itching, sneezing,  nasal congestion or excess/ purulent secretions, ear ache,   fever, chills, sweats, unintended wt loss, classically pleuritic or exertional cp, hemoptysis,  orthopnea pnd or leg swelling, presyncope, palpitations, abdominal pain, anorexia, nausea, vomiting, diarrhea  or change in bowel or bladder habits, change in stools or urine, dysuria,hematuria,  rash, arthralgias, visual complaints, headache, numbness, weakness or ataxia or problems with walking or coordination,  change in mood/affect or memory.                  Objective:   Physical Exam  amb hoarse wm  nad     Wt Readings from Last 3 Encounters:  12/25/14 174 lb (78.926 kg)  11/12/14 168 lb 1.9 oz (76.259 kg)  07/26/14 172 lb (78.019 kg)    Vital signs reviewed     HEENT: nl dentition, turbinates, and orophanx. Nl external ear canals without cough reflex   NECK :  without JVD/Nodes/TM/ nl carotid upstrokes bilaterally   LUNGS: no acc muscle use, clear to A and P bilaterally without cough on insp or exp maneuvers   CV:  RRR  no s3 or murmur or increase in P2, no edema   ABD:  soft and nontender with nl excursion in the supine position. No bruits or organomegaly, bowel sounds nl  MS:  warm without deformities, calf tenderness, cyanosis or clubbing  SKIN: warm and dry without lesions    NEURO:  alert, approp, no deficits noted.       I personally reviewed images and agree with radiology impression as follows:  CXR:   09/06/14 No acute abnormality noted.       Assessment & Plan:

## 2014-12-25 NOTE — Assessment & Plan Note (Signed)
Cough is daytime and meal related and assoc with throat clearing typical of  Classic Upper airway cough syndrome, so named because it's frequently impossible to sort out how much is  CR/sinusitis with freq throat clearing (which can be related to primary GERD)   vs  causing  secondary (" extra esophageal")  GERD from wide swings in gastric pressure that occur with throat clearing, often  promoting self use of mint and menthol lozenges that reduce the lower esophageal sphincter tone and exacerbate the problem further in a cyclical fashion.   These are the same pts (now being labeled as having "irritable larynx syndrome" by some cough centers) who not infrequently have a history of having failed to tolerate ace inhibitors,  dry powder inhalers (and even sometimes hfa ics in high doses like symb 160) or biphosphonates or report having atypical reflux symptoms that don't respond to standard doses of PPI , and are easily confused as having aecopd or asthma flares by even experienced allergists/ pulmonologists.  rec  Max diet/ daily ppi and f/u in 4 week on a lower dose of ics (see copd )

## 2014-12-25 NOTE — Assessment & Plan Note (Signed)
pfts 06/29/14  FEV1   1.70 (72%) ratio 66 and DLCO 65 corrects to 99   12/25/2014 p extensive coaching HFA effectiveness =    90% from a baseline of 50%    He only has mild airflow obst and clear lungs on exam so probably could use the symbicort 160 one bid with option to increase to 2 bid if gets worse breathing and consider the spacer if that dose worsens cough  I had an extended discussion with the patient and wife  reviewing all relevant studies completed to date and  lasting 15 to 20 minutes of a 25 minute visit    Each maintenance medication was reviewed in detail including most importantly the difference between maintenance and prns and under what circumstances the prns are to be triggered using an action plan format that is not reflected in the computer generated alphabetically organized AVS.    Please see instructions for details which were reviewed in writing and the patient given a copy highlighting the part that I personally wrote and discussed at today's ov.

## 2015-01-22 ENCOUNTER — Ambulatory Visit (INDEPENDENT_AMBULATORY_CARE_PROVIDER_SITE_OTHER): Payer: Medicare Other | Admitting: Internal Medicine

## 2015-01-22 ENCOUNTER — Encounter: Payer: Self-pay | Admitting: Internal Medicine

## 2015-01-22 ENCOUNTER — Ambulatory Visit (INDEPENDENT_AMBULATORY_CARE_PROVIDER_SITE_OTHER)
Admission: RE | Admit: 2015-01-22 | Discharge: 2015-01-22 | Disposition: A | Discharge status: Home / Self Care | Payer: Medicare Other | Source: Ambulatory Visit | Attending: Internal Medicine | Admitting: Internal Medicine

## 2015-01-22 DIAGNOSIS — J449 Chronic obstructive pulmonary disease, unspecified: Secondary | ICD-10-CM | POA: Diagnosis not present

## 2015-01-22 DIAGNOSIS — I2510 Atherosclerotic heart disease of native coronary artery without angina pectoris: Secondary | ICD-10-CM | POA: Diagnosis not present

## 2015-01-22 MED ORDER — FAMOTIDINE 20 MG PO TABS: ORAL_TABLET | ORAL | Status: DC

## 2015-01-22 NOTE — Patient Instructions (Addendum)
Add pepcid ac 20 mg at bedtime  Continue the symbicort 160 one twice daily   Only use your albuterol as a rescue medication to be used if you can't catch your breath by resting or doing a relaxed purse lip breathing pattern.  - The less you use it, the better it will work when you need it. - Ok to use up to 2 puffs  every 4 hours if you must but call for immediate appointment if use goes up over your usual need - Don't leave home without it !!  (think of it like the spare tire for your car)   Please remember to go to the x-ray department downstairs for your tests - we will call you with the results when they are available.  Please schedule a follow up visit in 3 months but call sooner if needed

## 2015-01-22 NOTE — Progress Notes (Signed)
Subjective:    Patient ID: Benjamin PasseyDonald R Scheier, male    DOB: 1929-09-28    MRN: [id number]    Brief patient profile:  [age] yowm quit smoking at dx of sarcoid at age 81 dx in [place] by surgeon with neck node with cough/sob rx prednisone x at least a decade with chronic doe ever since then x yardwork and leveled off d/c prednisone around age 81 referred to pulmonary 03/27/2014  by [facility] primary care with doe s  Cough     History of Present Illness  03/27/2014 1st [place] [facility] office visit/ [facility]   Chief Complaint  Patient presents with  . Pulmonary Consult    Referred by [name], NP. Pt states had lung bx age 81 and was dxed with Sarcoid. [place] [facility] [place]. He c/o SOB that comes and goes- sometimes gets out of breath when walking the dog and exposed to cold weather.   doe is chronic and more related to how heavy he exerts   Maybe better with symbicort ? Makes him hoarse  Has combivent but doesn't think it helps/ doesn't use it rec Try symbicort 160 one twice daily to see if you don't do just as well    06/29/2014 f/u ov/Wert re: copd GOLD II Chief Complaint  Patient presents with  . Follow-up    PFT done today. Pt states that his breathing seems to be doing some better. He is still using symbicort 2 puffs bid. No new co's today.   better and Not limited by breathing from desired activities      >>symbicort  09/07/2014  ER follow up  Patient returns for a emergency room follow-up Patient says that he went to the emergency room yesterday after feeling more short of breath over the last few days He has had more shortness of breath with activity He denies any chest pain, syncope, orthopnea, PND, or increased leg swelling Has had minimum dry cough, sinus drainage. Chest x-ray at the emergency room was negative Troponin was negative. rec Prednisone 20mg  daily for 5 days then 10mg  daily for 5 days and stop .  Mucinex DM Twice daily  As needed   Cough/congestion  Claritin 10mg  At bedtime  As needed  Drainage Saline nasal rinses As needed   Flonase 2 puff daily As needed      12/25/2014 f/u ov/Wert re: GOLD II copd/ on symbicort 160 2bid  Chief Complaint  Patient presents with  . Follow-up    Pt states his breathing is unchanged. He c/o "a lot of congestion from my sinuses"- has prod cough with minimal grey sputum.   can walk around complex s stopping x 45 min including some hills  Cough mostly daytime on prn prilosec worse with meals - sleeps ok and no need for saba but wants access to it in case he does (lives in assisted living and has to ask for it) rec Try reduce symbicort to 160 one twice daily  Add prilosec 20 mg Take 30-60 min before first meal of the day  GERD  Diet    01/22/2015  f/u ov/Wert re: GOLD II copd / symbicort 160 one twice daily  Chief Complaint  Patient presents with  . Follow-up    Having increased sob with exertion on high humidity,this am burping,sob,bp up this am,no dizzy,tremors in hands x 2 days,no fcs.No wheezing,cough-dry,PND.  even in ac on best days doe = MMRC2 = can't walk a nl pace on  a flat grade s sob/ some better with saba but then makes him shake.    No obvious day to day or daytime variability or assoc  cp or chest tightness, subjective wheeze or overt   hb symptoms. No unusual exp hx or h/o childhood pna/ asthma or knowledge of premature birth.  Sleeping ok without nocturnal  or early am exacerbation  of respiratory  c/o's or need for noct saba. Also denies any obvious fluctuation of symptoms with weather or environmental changes or other aggravating or alleviating factors except as outlined above   Current Medications, Allergies, Complete Past Medical History, Past Surgical History, Family History, and Social History were reviewed in [employer] record.  ROS  The following are not active complaints unless bolded sore throat, dysphagia, dental problems, itching,  sneezing,  nasal congestion or excess/ purulent secretions, ear ache,   fever, chills, sweats, unintended wt loss, classically pleuritic or exertional cp, hemoptysis,  orthopnea pnd or leg swelling, presyncope, palpitations, abdominal pain, anorexia, nausea, vomiting, diarrhea  or change in bowel or bladder habits, change in stools or urine, dysuria,hematuria,  rash, arthralgias, visual complaints, headache, numbness, weakness or ataxia or problems with walking or coordination,  change in mood/affect or memory.                Objective:   Physical Exam  amb hoarse wm  nad    01/22/2015        174 Wt Readings from Last 3 Encounters:  12/25/14 174 lb (78.926 kg)  11/12/14 168 lb 1.9 oz (76.259 kg)  07/26/14 172 lb (78.019 kg)    Vital signs reviewed     HEENT: nl dentition, turbinates, and orophanx. Nl external ear canals without cough reflex   NECK :  without JVD/Nodes/TM/ nl carotid upstrokes bilaterally   LUNGS: no acc muscle use, clear to A and P bilaterally without cough on insp or exp maneuvers   CV:  RRR  no s3 or murmur or increase in P2, no edema   ABD:  soft and nontender with nl excursion in the supine position. No bruits or organomegaly, bowel sounds nl  MS:  warm without deformities, calf tenderness, cyanosis or clubbing  SKIN: warm and dry without lesions    NEURO:  alert, approp, no deficits noted.       I personally reviewed images and agree with radiology impression as follows:  CXR:    01/22/2015   Stable chest with evidence of prior granulomatous disease and mild hyperinflation. No acute findings.    Assessment & Plan:

## 2015-01-23 ENCOUNTER — Telehealth: Payer: Self-pay | Admitting: Internal Medicine

## 2015-01-23 ENCOUNTER — Encounter: Payer: Self-pay | Admitting: Family

## 2015-01-23 ENCOUNTER — Ambulatory Visit (INDEPENDENT_AMBULATORY_CARE_PROVIDER_SITE_OTHER): Payer: Medicare Other | Admitting: Family

## 2015-01-23 ENCOUNTER — Telehealth: Payer: Self-pay | Admitting: Family

## 2015-01-23 ENCOUNTER — Other Ambulatory Visit (INDEPENDENT_AMBULATORY_CARE_PROVIDER_SITE_OTHER): Payer: Medicare Other

## 2015-01-23 VITALS — BP 138/64 | HR 51 | Temp 98.0°F | Resp 18 | Ht 68.0 in | Wt 174.0 lb

## 2015-01-23 DIAGNOSIS — E1165 Type 2 diabetes mellitus with hyperglycemia: Secondary | ICD-10-CM

## 2015-01-23 DIAGNOSIS — E031 Congenital hypothyroidism without goiter: Secondary | ICD-10-CM

## 2015-01-23 DIAGNOSIS — I10 Essential (primary) hypertension: Secondary | ICD-10-CM

## 2015-01-23 DIAGNOSIS — IMO0002 Reserved for concepts with insufficient information to code with codable children: Secondary | ICD-10-CM

## 2015-01-23 DIAGNOSIS — Z23 Encounter for immunization: Secondary | ICD-10-CM

## 2015-01-23 DIAGNOSIS — I2510 Atherosclerotic heart disease of native coronary artery without angina pectoris: Secondary | ICD-10-CM | POA: Diagnosis not present

## 2015-01-23 LAB — BASIC METABOLIC PANEL
BUN: 25 mg/dL — ABNORMAL HIGH (ref 6–23)
CALCIUM: 9.3 mg/dL (ref 8.4–10.5)
CO2: 33 mEq/L — ABNORMAL HIGH (ref 19–32)
Chloride: 102 mEq/L (ref 96–112)
Creatinine, Ser: 1.43 mg/dL (ref 0.40–1.50)
GFR: 50.23 mL/min — AB (ref >60.00)
GLUCOSE: 100 mg/dL — AB (ref 70–99)
POTASSIUM: 4 meq/L (ref 3.5–5.1)
SODIUM: 139 meq/L (ref 135–145)

## 2015-01-23 LAB — TSH: TSH: 0.12 u[IU]/mL — ABNORMAL LOW (ref 0.35–4.50)

## 2015-01-23 LAB — HEMOGLOBIN A1C: HEMOGLOBIN A1C: 6.3 % (ref 4.6–6.5)

## 2015-01-23 NOTE — Telephone Encounter (Signed)
Please inform patient that his A1c is 6.0 which is excellent and therefore please continue to take his metformin. His TSH was slightly low indicating that his medication may be slightly high for his thyroid, but we will recheck this at his next visit unless he has symptoms such as heat intolerance, sweating, heart racing, or changes to his skin, hair or nails. Lastly his kidney function remains constant. I would like to add a low dose lisinopril if he is agreeable to help protect his heart and kidneys.

## 2015-01-23 NOTE — Progress Notes (Signed)
Subjective:    Patient ID: Benjamin Mccall, male    DOB: Jul 30, 1929, 81 y.o.   MRN: [id number]  Chief Complaint  Patient presents with  . Follow-up    Sugars have been running good, has been having SOB that he went to pulmonogist for yesterday    HPI:  Benjamin Mccall is a 81 y.o. male who  has a past medical history of Diabetes mellitus without complication; GERD (gastroesophageal reflux disease); Hyperlipidemia; Hypertension; Hypothyroid; Chest pain; Atrial fibrillation; and Asthma. and presents today for a follow up office visit.  1.) Diabetes - Currently maintained on metformin. Takes the medication as prescribed and denies adverse side effects or hypoglycemic events. Reports that his blood sugars have been around 90-120. Due for diabetic eye exam. On atorvastatin to reduce CAD risk. Not currently maintained on an ACE/ARB. Will check on his PNA vaccinations.  Lab Results  Component Value Date   HGBA1C 6.3 01/23/2015    2.) Hypertension - Currently maintained on diltizem, hydrochlorothiazide, and nitroglycerin patch.   BP Readings from Last 3 Encounters:  01/23/15 138/64  01/22/15 114/62  12/25/14 118/64    3.) Hypothyroidism - Currently maintained on levothyroxine. Takes the medications as prescribed and denies adverse side effects.   Lab Results  Component Value Date   TSH 0.12* 01/23/2015   Allergies  Allergen Reactions  . Reglan [Metoclopramide]     Tremors   . Sulfa Antibiotics Other (See Comments)    shakes     Current Outpatient Prescriptions on File Prior to Visit  Medication Sig Dispense Refill  . albuterol (PROVENTIL HFA;VENTOLIN HFA) 108 (90 BASE) MCG/ACT inhaler Inhale 2 puffs into the lungs 2 (two) times daily as needed for wheezing or shortness of breath.    . ALPRAZolam (XANAX) 0.5 MG tablet Take 0.5 mg by mouth at bedtime as needed for anxiety.    [name] aspirin 81 MG tablet Take 81 mg by mouth daily.    [name] atorvastatin (LIPITOR) 20 MG tablet Take  20 mg by mouth daily.    . budesonide-formoterol (SYMBICORT) 160-4.5 MCG/ACT inhaler Inhale 2 puffs into the lungs 2 (two) times daily. 1 Inhaler 0  . Cholecalciferol (VITAMIN D3) 1000 UNITS CAPS Take by mouth daily.    . citalopram (CELEXA) 10 MG tablet Take 10 mg by mouth daily.    . clopidogrel (PLAVIX) 75 MG tablet Take 75 mg by mouth daily.    [name] diltiazem (CARDIZEM CD) 240 MG 24 hr capsule Take 240 mg by mouth daily.    [name] ezetimibe (ZETIA) 10 MG tablet Take 10 mg by mouth daily.    . famotidine (PEPCID) 20 MG tablet One at bedtime    . Fexofenadine HCl (MUCINEX ALLERGY PO) Take 600 mg by mouth as needed.    . fluticasone (FLONASE) 50 MCG/ACT nasal spray Place 2 sprays into both nostrils as needed for allergies or rhinitis.    [name] gabapentin (NEURONTIN) 300 MG capsule Take 300 mg by mouth at bedtime.    [name] guaiFENesin (MUCINEX) 600 MG 12 hr tablet Take by mouth 2 (two) times daily.    . hydrochlorothiazide (HYDRODIURIL) 25 MG tablet Take 25 mg by mouth daily.    [name] ibuprofen (ADVIL,MOTRIN) 200 MG tablet Take 200 mg by mouth every 6 (six) hours as needed.    [name] levothyroxine (SYNTHROID, LEVOTHROID) 100 MCG tablet Take 100 mcg by mouth daily.    [name] loratadine (CLARITIN) 10 MG tablet Take 10 mg by mouth daily as needed for  allergies.    . metFORMIN (GLUCOPHAGE) 500 MG tablet Take by mouth 2 (two) times daily with a meal.    . Multiple Vitamin (THERA-TABS PO) Take by mouth daily.    . nitroGLYCERIN (NITRODUR - DOSED IN MG/24 HR) 0.6 mg/hr patch Place 0.6 mg onto the skin daily.    . nitroGLYCERIN (NITROSTAT) 0.4 MG SL tablet Place 0.4 mg under the tongue as needed for chest pain.    [name] omeprazole (PRILOSEC) 20 MG capsule Take 20 mg by mouth daily.    . polyethylene glycol powder (GLYCOLAX/MIRALAX) powder Take 1 Container by mouth once.    . potassium chloride (MICRO-K) 10 MEQ CR capsule Take 10 mEq by mouth 2 (two) times daily.    . sodium chloride (DEEP SEA NASAL SPRAY) 0.65 % nasal spray Place 1  spray into the nose as needed for congestion.     No current facility-administered medications on file prior to visit.     Past Surgical History  Procedure Laterality Date  . Percutaneous coronary stent intervention (pci-s)    . Hiatal hernia repair    . Cataract extraction    . Appendectomy      Past Medical History  Diagnosis Date  . Diabetes mellitus without complication   . GERD (gastroesophageal reflux disease)   . Hyperlipidemia   . Hypertension   . Hypothyroid   . Chest pain   . Atrial fibrillation   . Asthma      Review of Systems  Constitutional: Negative for fever, chills and fatigue.  Eyes:       Negative for changes in vision  Respiratory: Negative for chest tightness and shortness of breath.   Cardiovascular: Negative for chest pain, palpitations and leg swelling.  Endocrine: Negative for polydipsia, polyphagia and polyuria.  Neurological: Negative for weakness and headaches.      Objective:    BP 138/64 mmHg  Pulse 51  Temp(Src) 98 F (36.7 C) (Oral)  Resp 18  Ht  (1.727 m)  Wt 174 lb (78.926 kg)  BMI 26.46 kg/m2  SpO2 96% Nursing note and vital signs reviewed.  Physical Exam  Constitutional: He is oriented to person, place, and time. He appears well-developed and well-nourished. No distress.  Cardiovascular: Normal rate, regular rhythm, normal heart sounds and intact distal pulses.   Pulmonary/Chest: Effort normal and breath sounds normal.  Neurological: He is alert and oriented to person, place, and time.  Diabetic foot exam - bilateral feet are free from skin breakdown, cuts, and abrasions. Toenails appear unkept with some yellow areas. Pulses are intact and appropriate. Sensation is intact to monofilament bilaterally with mild decrease in left great toe.   Skin: Skin is warm and dry.  Psychiatric: He has a normal mood and affect. His behavior is normal. Judgment and thought content normal.       Assessment & Plan:   Problem List  Items Addressed This Visit      Cardiovascular and Mediastinum   Essential hypertension    Hypertension appears well controlled with current regimen and below goal of 140/90. Blood pressure is monitored at the assisted living home. Continue current dosage of hydrochlorothiazide and diltiazem. Refer to opthalmology for eye exam. Follow up in 6 months or sooner if needed.       Relevant Orders   Basic Metabolic Panel (BMET) (Completed)     Endocrine   Hypothyroidism - Primary    Appears stable with current dosage of levothyroxine. Obtain TSH to determine current status.  Continue current dosage of levothyroxine pending TSH results.       Relevant Orders   TSH (Completed)     Other   Type II diabetes mellitus, uncontrolled    Diabetes appears controlled with current regimen of metformin. Facility reported averages are with goal range of 90-130. Diabetic foot exam completed today. Refer to opthalmology for diabetic eye exam. Currently on atorvastatin to reduce CAD risk and will check on PNA vaccination status.Continue current dosage of metformin pending A1c.       Relevant Orders   HgB A1c (Completed)   Basic Metabolic Panel (BMET) (Completed)   Ambulatory referral to Ophthalmology    Other Visit Diagnoses    Encounter for immunization        Need for Tdap vaccination        Relevant Orders    Tdap vaccine greater than or equal to 7yo IM (Completed)

## 2015-01-23 NOTE — Assessment & Plan Note (Signed)
Hypertension appears well controlled with current regimen and below goal of 140/90. Blood pressure is monitored at the assisted living home. Continue current dosage of hydrochlorothiazide and diltiazem. Refer to opthalmology for eye exam. Follow up in 6 months or sooner if needed.

## 2015-01-23 NOTE — Assessment & Plan Note (Signed)
Diabetes appears controlled with current regimen of metformin. Facility reported averages are with goal range of 90-130. Diabetic foot exam completed today. Refer to opthalmology for diabetic eye exam. Currently on atorvastatin to reduce CAD risk and will check on PNA vaccination status.Continue current dosage of metformin pending A1c.

## 2015-01-23 NOTE — Telephone Encounter (Signed)
Per CXR results: Result Note     Call pt: Reviewed cxr and no acute change so no change in recommendations made at ov  --  I spoke with patient about results and he verbalized understanding and had no questions.

## 2015-01-23 NOTE — Assessment & Plan Note (Signed)
Appears stable with current dosage of levothyroxine. Obtain TSH to determine current status. Continue current dosage of levothyroxine pending TSH results.

## 2015-01-23 NOTE — Progress Notes (Signed)
Pre visit review using our clinic review tool, if applicable. No additional management support is needed unless otherwise documented below in the visit note. 

## 2015-01-23 NOTE — Patient Instructions (Signed)
Thank you for choosing [facility].  Summary/Instructions:   Please continue to take your medications as prescribed.   Please stop by the lab on the basement level of the building for your blood work. Your results will be released to MyChart (or called to you) after review, usually within 72 hours after test completion. If any changes need to be made, you will be notified at that same time.    

## 2015-01-26 NOTE — Assessment & Plan Note (Addendum)
pfts 06/29/14  FEV1   1.70 (72%) ratio 66 and DLCO 65 corrects to 99  - 12/25/2014 p extensive coaching HFA effectiveness =    90% > try to reduce symbicort to 160 one bid due to ? throat irritation  - 01/22/2015  Walked RA x 3 laps @ 185 ft each stopped due to  End of study, nl pace with cane , no sob or desat     Symptoms remain disproportionate to objective findings and not clear this is all a lung problem but pt does appear to have difficult airway management issues.   DDX of  difficult airways management all start with A and  include Adherence, Ace Inhibitors, Acid Reflux, Active Sinus Disease, Alpha 1 Antitripsin deficiency, Anxiety masquerading as Airways dz,  ABPA,  allergy(esp in young), Aspiration (esp in elderly), Adverse effects of meds,  Active smokers, A bunch of PE's (a small clot burden can't cause this syndrome unless there is already severe underlying pulm or vascular dz with poor reserve) plus two Bs  = Bronchiectasis and Beta blocker use..and one C= CHF  Adherence is always the initial "prime suspect" and is a multilayered concern that requires a "trust but verify" approach in every patient - starting with knowing how to use medications, especially inhalers, correctly, keeping up with refills and understanding the fundamental difference between maintenance and prns vs those medications only taken for a very short course and then stopped and not refilled.  - confirmed hfa effective > no change symbicort 160 one bid   ? Acid (or non-acid) GERD > always difficult to exclude as up to 75% of pts in some series report no assoc GI/ Heartburn symptoms> rec max (24h)  acid suppression and diet restrictions/ reviewed and instructions given in writing.   ? Anxiety > usually at the bottom of this list of usual suspects but should be much higher on this pt's based on H and P and note already on psychotropics .   I had an extended discussion with the patient reviewing all relevant studies completed  to date and  lasting 15 to 20 minutes of a 25 minute visit    Each maintenance medication was reviewed in detail including most importantly the difference between maintenance and prns and under what circumstances the prns are to be triggered using an action plan format that is not reflected in the computer generated alphabetically organized AVS.    Please see instructions for details which were reviewed in writing and the patient given a copy highlighting the part that I personally wrote and discussed at today's ov.

## 2015-01-28 MED ORDER — LISINOPRIL 2.5 MG PO TABS: 2.5000 mg | ORAL_TABLET | Freq: Every day | ORAL | Status: DC

## 2015-01-28 NOTE — Telephone Encounter (Signed)
Pt aware of results 

## 2015-01-28 NOTE — Addendum Note (Signed)
Addended by: Jeanine Luz D on: 01/28/2015 04:11 PM   Modules accepted: Orders

## 2015-01-28 NOTE — Telephone Encounter (Signed)
Pt is also interested in the lisinopril.

## 2015-01-28 NOTE — Telephone Encounter (Signed)
Medication printed to be faxed.  

## 2015-01-29 ENCOUNTER — Telehealth: Payer: Self-pay | Admitting: Family

## 2015-01-29 ENCOUNTER — Other Ambulatory Visit: Payer: Self-pay | Admitting: Family

## 2015-01-29 NOTE — Telephone Encounter (Signed)
Bukky from [facility] called and are waiting on orders for new medication patient was prescribed yesterday. She can be reached at [phone]

## 2015-01-30 NOTE — Telephone Encounter (Signed)
Script has been faxed over

## 2015-01-31 ENCOUNTER — Ambulatory Visit: Payer: Medicare Other | Admitting: Family

## 2015-02-05 ENCOUNTER — Encounter: Payer: Self-pay | Admitting: Podiatry

## 2015-02-05 ENCOUNTER — Ambulatory Visit (INDEPENDENT_AMBULATORY_CARE_PROVIDER_SITE_OTHER): Payer: Medicare Other | Admitting: Podiatry

## 2015-02-05 DIAGNOSIS — B351 Tinea unguium: Secondary | ICD-10-CM

## 2015-02-05 DIAGNOSIS — E119 Type 2 diabetes mellitus without complications: Secondary | ICD-10-CM

## 2015-02-05 DIAGNOSIS — M79676 Pain in unspecified toe(s): Secondary | ICD-10-CM | POA: Diagnosis not present

## 2015-02-05 NOTE — Progress Notes (Signed)
Patient ID: Benjamin Mccall, male   DOB: 11/01/1929, 81 y.o.   MRN: [id number] Complaint:  Visit Type: Patient returns to my office for continued preventative foot care services. Complaint: Patient states" my nails have grown long and thick and become painful to walk and wear shoes" Patient has been diagnosed with DM with no complications. He presents for preventative foot care services. No changes to ROS  Podiatric Exam: Vascular: dorsalis pedis and posterior tibial pulses are palpable bilateral. Capillary return is immediate. Temperature gradient is WNL. Skin turgor WNL  Sensorium: Normal Semmes Weinstein monofilament test. Normal tactile sensation bilaterally. Nail Exam: Pt has thick disfigured discolored nails with subungual debris noted bilateral entire nail hallux through fifth toenails Ulcer Exam: There is no evidence of ulcer or pre-ulcerative changes or infection. Orthopedic Exam: Muscle tone and strength are WNL. No limitations in general ROM. No crepitus or effusions noted. Foot type and digits show no abnormalities. Bony prominences are unremarkable. Skin: No Porokeratosis. No infection or ulcers  Diagnosis:  Tinea unguium, Pain in right toe, pain in left toes  Treatment & Plan Procedures and Treatment: Consent by patient was obtained for treatment procedures. The patient understood the discussion of treatment and procedures well. All questions were answered thoroughly reviewed. Debridement of mycotic and hypertrophic toenails, 1 through 5 bilateral and clearing of subungual debris. No ulceration, no infection noted.  Return Visit-Office Procedure: Patient instructed to return to the office for a follow up visit 3 months for continued evaluation and treatment. 

## 2015-02-14 ENCOUNTER — Emergency Department (HOSPITAL_COMMUNITY): Payer: Medicare Other

## 2015-02-14 ENCOUNTER — Encounter (HOSPITAL_COMMUNITY): Payer: Self-pay | Admitting: Emergency Medicine

## 2015-02-14 ENCOUNTER — Emergency Department (HOSPITAL_COMMUNITY)
Admission: EM | Admit: 2015-02-14 | Discharge: 2015-02-15 | Disposition: A | Discharge status: Home / Self Care | Payer: Medicare Other | Attending: Emergency Medicine | Admitting: Emergency Medicine

## 2015-02-14 DIAGNOSIS — Z9861 Coronary angioplasty status: Secondary | ICD-10-CM | POA: Insufficient documentation

## 2015-02-14 DIAGNOSIS — R630 Anorexia: Secondary | ICD-10-CM | POA: Insufficient documentation

## 2015-02-14 DIAGNOSIS — E039 Hypothyroidism, unspecified: Secondary | ICD-10-CM | POA: Diagnosis not present

## 2015-02-14 DIAGNOSIS — Z79899 Other long term (current) drug therapy: Secondary | ICD-10-CM | POA: Diagnosis not present

## 2015-02-14 DIAGNOSIS — E785 Hyperlipidemia, unspecified: Secondary | ICD-10-CM | POA: Insufficient documentation

## 2015-02-14 DIAGNOSIS — Z7951 Long term (current) use of inhaled steroids: Secondary | ICD-10-CM | POA: Insufficient documentation

## 2015-02-14 DIAGNOSIS — I10 Essential (primary) hypertension: Secondary | ICD-10-CM | POA: Diagnosis not present

## 2015-02-14 DIAGNOSIS — Z7901 Long term (current) use of anticoagulants: Secondary | ICD-10-CM | POA: Insufficient documentation

## 2015-02-14 DIAGNOSIS — J45909 Unspecified asthma, uncomplicated: Secondary | ICD-10-CM | POA: Insufficient documentation

## 2015-02-14 DIAGNOSIS — R140 Abdominal distension (gaseous): Secondary | ICD-10-CM | POA: Diagnosis not present

## 2015-02-14 DIAGNOSIS — Z7982 Long term (current) use of aspirin: Secondary | ICD-10-CM | POA: Insufficient documentation

## 2015-02-14 DIAGNOSIS — E119 Type 2 diabetes mellitus without complications: Secondary | ICD-10-CM | POA: Diagnosis not present

## 2015-02-14 DIAGNOSIS — Z87891 Personal history of nicotine dependence: Secondary | ICD-10-CM | POA: Insufficient documentation

## 2015-02-14 DIAGNOSIS — I4891 Unspecified atrial fibrillation: Secondary | ICD-10-CM | POA: Diagnosis not present

## 2015-02-14 DIAGNOSIS — K219 Gastro-esophageal reflux disease without esophagitis: Secondary | ICD-10-CM | POA: Diagnosis not present

## 2015-02-14 DIAGNOSIS — R109 Unspecified abdominal pain: Secondary | ICD-10-CM | POA: Diagnosis present

## 2015-02-14 HISTORY — DX: Diaphragmatic hernia without obstruction or gangrene: K44.9

## 2015-02-14 LAB — LIPASE, BLOOD: Lipase: 25 U/L (ref 11–51)

## 2015-02-14 LAB — COMPREHENSIVE METABOLIC PANEL
ALBUMIN: 3.5 g/dL (ref 3.5–5.0)
ALK PHOS: 61 U/L (ref 38–126)
ALT: 16 U/L — ABNORMAL LOW (ref 17–63)
ANION GAP: 8 (ref 5–15)
AST: 24 U/L (ref 15–41)
BUN: 23 mg/dL — ABNORMAL HIGH (ref 6–20)
CHLORIDE: 99 mmol/L — AB (ref 101–111)
CO2: 26 mmol/L (ref 22–32)
Calcium: 9.2 mg/dL (ref 8.9–10.3)
Creatinine, Ser: 1.46 mg/dL — ABNORMAL HIGH (ref 0.61–1.24)
GFR calc Af Amer: 50 mL/min — ABNORMAL LOW (ref >60)
GFR calc non Af Amer: 43 mL/min — ABNORMAL LOW (ref >60)
GLUCOSE: 126 mg/dL — AB (ref 65–99)
Potassium: 3.7 mmol/L (ref 3.5–5.1)
SODIUM: 133 mmol/L — AB (ref 135–145)
Total Bilirubin: 0.8 mg/dL (ref 0.3–1.2)
Total Protein: 6.5 g/dL (ref 6.5–8.1)

## 2015-02-14 LAB — URINALYSIS, ROUTINE W REFLEX MICROSCOPIC
Bilirubin Urine: NEGATIVE
GLUCOSE, UA: NEGATIVE mg/dL
HGB URINE DIPSTICK: NEGATIVE
Ketones, ur: NEGATIVE mg/dL
LEUKOCYTES UA: NEGATIVE
Nitrite: NEGATIVE
PROTEIN: NEGATIVE mg/dL
SPECIFIC GRAVITY, URINE: 1.013 (ref 1.005–1.030)
Urobilinogen, UA: 1 mg/dL (ref 0.0–1.0)
pH: 6.5 (ref 5.0–8.0)

## 2015-02-14 LAB — CBC
HEMATOCRIT: 35.4 % — AB (ref 39.0–52.0)
HEMOGLOBIN: 12.1 g/dL — AB (ref 13.0–17.0)
MCH: 30.1 pg (ref 26.0–34.0)
MCHC: 34.2 g/dL (ref 30.0–36.0)
MCV: 88.1 fL (ref 78.0–100.0)
Platelets: 218 10*3/uL (ref 150–400)
RBC: 4.02 MIL/uL — AB (ref 4.22–5.81)
RDW: 13.6 % (ref 11.5–15.5)
WBC: 10.8 10*3/uL — ABNORMAL HIGH (ref 4.0–10.5)

## 2015-02-14 MED ORDER — ONDANSETRON HCL 4 MG/2ML IJ SOLN
4.0000 mg | Freq: Once | INTRAMUSCULAR | Status: AC
  Administered 2015-02-14: 4 mg via INTRAVENOUS
  Filled 2015-02-14: qty 2

## 2015-02-14 MED ORDER — FENTANYL CITRATE (PF) 100 MCG/2ML IJ SOLN
100.0000 ug | Freq: Once | INTRAMUSCULAR | Status: AC
  Administered 2015-02-14: 100 ug via INTRAVENOUS
  Filled 2015-02-14: qty 2

## 2015-02-14 NOTE — ED Provider Notes (Signed)
CSN: [id number]     Arrival date & time 02/14/15  2047 History   First MD Initiated Contact with Patient 02/14/15 2056     Chief Complaint  Patient presents with  . Abdominal Pain     (Consider location/radiation/quality/duration/timing/severity/associated sxs/prior Treatment) The history is provided by the patient.     Benjamin Mccall is a 81 y.o. male who presents for evaluation of abdominal pain and swelling, which began yesterday. The pain is persistent. His appetite has been diminished today, and he has less stooling, then usual. He states that when he did have a bowel movement, it looks like "worms". Has been no rectal bleeding. He denies fever, chills, chest pain, back pain, weakness or dizziness. There are no other known modifying factors.   Past Medical History  Diagnosis Date  . Diabetes mellitus without complication (HCC)   . GERD (gastroesophageal reflux disease)   . Hyperlipidemia   . Hypertension   . Hypothyroid   . Chest pain   . Atrial fibrillation (HCC)   . Asthma   . Hernia, hiatal    Past Surgical History  Procedure Laterality Date  . Percutaneous coronary stent intervention (pci-s)    . Hiatal hernia repair    . Cataract extraction    . Appendectomy     Family History  Problem Relation Age of Onset  . Hypertension Mother   . Stroke Mother   . Ovarian cancer Mother   . Stroke Father   . Diabetes Son    Social History  Substance Use Topics  . Smoking status: Former Smoker -- 0.50 packs/day for 7 years    Types: Pipe, Cigars    Quit date: 04/27/1968  . Smokeless tobacco: Never Used  . Alcohol Use: No    Review of Systems  All other systems reviewed and are negative.     Allergies  Reglan and Sulfa antibiotics  Home Medications   Prior to Admission medications   Medication Sig Start Date End Date Taking? Authorizing Provider  albuterol (PROVENTIL HFA;VENTOLIN HFA) 108 (90 BASE) MCG/ACT inhaler Inhale 2 puffs into the lungs 2 (two)  times daily as needed for wheezing or shortness of breath.   Yes Historical Provider, MD  ALPRAZolam Prudy Feeler(XANAX) 0.25 MG tablet Take 0.25 mg by mouth at bedtime as needed for anxiety.   Yes Historical Provider, MD  aspirin 81 MG tablet Take 81 mg by mouth daily.   Yes Historical Provider, MD  atorvastatin (LIPITOR) 20 MG tablet Take 20 mg by mouth daily at 6 PM.    Yes Historical Provider, MD  budesonide-formoterol (SYMBICORT) 160-4.5 MCG/ACT inhaler Inhale 2 puffs into the lungs 2 (two) times daily. 03/27/14  Yes Nyoka CowdenMichael B Wert, MD  Cholecalciferol (VITAMIN D3) 1000 UNITS CAPS Take 1,000 Units by mouth daily.    Yes Historical Provider, MD  citalopram (CELEXA) 10 MG tablet Take 10 mg by mouth daily.   Yes Historical Provider, MD  clopidogrel (PLAVIX) 75 MG tablet Take 75 mg by mouth daily.   Yes Historical Provider, MD  diltiazem (CARDIZEM CD) 240 MG 24 hr capsule Take 240 mg by mouth daily.   Yes Historical Provider, MD  ezetimibe (ZETIA) 10 MG tablet Take 10 mg by mouth daily.   Yes Historical Provider, MD  famotidine (PEPCID) 20 MG tablet One at bedtime Patient taking differently: Take 20 mg by mouth at bedtime. One at bedtime 01/22/15  Yes Nyoka CowdenMichael B Wert, MD  fluticasone [facility](FLONASE) 50 MCG/ACT nasal spray Place 2 sprays into  both nostrils as needed for allergies or rhinitis.   Yes Historical Provider, MD  gabapentin (NEURONTIN) 300 MG capsule Take 300 mg by mouth at bedtime.   Yes Historical Provider, MD  guaiFENesin (MUCINEX) 600 MG 12 hr tablet Take 600 mg by mouth 2 (two) times daily as needed for cough.    Yes Historical Provider, MD  hydrochlorothiazide (HYDRODIURIL) 25 MG tablet Take 25 mg by mouth daily.   Yes Historical Provider, MD  ibuprofen (ADVIL,MOTRIN) 200 MG tablet Take 200 mg by mouth every 6 (six) hours as needed for mild pain or moderate pain.    Yes Historical Provider, MD  levothyroxine (SYNTHROID, LEVOTHROID) 100 MCG tablet Take 100 mcg by mouth daily.   Yes Historical Provider, MD   lisinopril (PRINIVIL,ZESTRIL) 2.5 MG tablet Take 1 tablet (2.5 mg total) by mouth daily. 01/28/15  Yes Veryl Speak, FNP  loratadine (CLARITIN) 10 MG tablet Take 10 mg by mouth daily as needed for allergies.   Yes Historical Provider, MD  metFORMIN (GLUCOPHAGE) 500 MG tablet Take 500 mg by mouth 2 (two) times daily with a meal.    Yes Historical Provider, MD  Multiple Vitamin (THERA-TABS PO) Take 1 tablet by mouth daily.    Yes Historical Provider, MD  nitroGLYCERIN (NITRODUR - DOSED IN MG/24 HR) 0.6 mg/hr patch Place 0.6 mg onto the skin daily.   Yes Historical Provider, MD  nitroGLYCERIN (NITROSTAT) 0.4 MG SL tablet Place 0.4 mg under the tongue as needed for chest pain.   Yes Historical Provider, MD  omeprazole (PRILOSEC) 20 MG capsule Take 20 mg by mouth daily.   Yes Historical Provider, MD  polyethylene glycol powder (GLYCOLAX/MIRALAX) powder Take 1 Container by mouth daily.    Yes Historical Provider, MD  potassium chloride (MICRO-K) 10 MEQ CR capsule Take 10 mEq by mouth 2 (two) times daily.   Yes Historical Provider, MD  sodium chloride (DEEP SEA NASAL SPRAY) 0.65 % nasal spray Place 1 spray into the nose as needed for congestion.   Yes Historical Provider, MD   BP 143/83 mmHg  Pulse 57  Temp(Src) 98.6 F (37 C) (Oral)  Resp 12  Ht 5\' 8"  (1.727 m)  Wt 172 lb (78.019 kg)  BMI 26.16 kg/m2  SpO2 90% Physical Exam  Constitutional: He is oriented to person, place, and time. He appears well-developed. No distress.  Elderly, frail  HENT:  Head: Normocephalic and atraumatic.  Right Ear: External ear normal.  Left Ear: External ear normal.  Eyes: Conjunctivae and EOM are normal. Pupils are equal, round, and reactive to light.  Neck: Normal range of motion and phonation normal. Neck supple.  Cardiovascular: Normal rate, regular rhythm and normal heart sounds.   Pulmonary/Chest: Effort normal and breath sounds normal. He exhibits no bony tenderness.  Abdominal: Soft. He exhibits  distension and mass (Reducible midline hernia, above the umbilicus, about 3 cm in diameter. No overlying skin deformity or color change.). There is tenderness (Diffuse, mild). There is no rebound and no guarding.  Musculoskeletal: Normal range of motion.  Neurological: He is alert and oriented to person, place, and time. No cranial nerve deficit or sensory deficit. He exhibits normal muscle tone. Coordination normal.  Skin: Skin is warm, dry and intact.  Psychiatric: He has a normal mood and affect. His behavior is normal. Judgment and thought content normal.  Nursing note and vitals reviewed.   ED Course  Procedures (including critical care time)  Medications  fentaNYL (SUBLIMAZE) injection 100 mcg (100 mcg  Intravenous Given 02/14/15 2208)  ondansetron (ZOFRAN) injection 4 mg (4 mg Intravenous Given 02/14/15 2208)  iohexol (OMNIPAQUE) 300 MG/ML solution 100 mL (100 mLs Intravenous Contrast Given 02/15/15 0023)    Patient Vitals for the past 24 hrs:  BP Pulse Resp SpO2  02/15/15 0115 143/83 mmHg - - 90 %  02/15/15 0105 (!) 128/48 mmHg (!) 57 12 95 %       Labs Review Labs Reviewed  COMPREHENSIVE METABOLIC PANEL - Abnormal; Notable for the following:    Sodium 133 (*)    Chloride 99 (*)    Glucose, Bld 126 (*)    BUN 23 (*)    Creatinine, Ser 1.46 (*)    ALT 16 (*)    GFR calc non Af Amer 43 (*)    GFR calc Af Amer 50 (*)    All other components within normal limits  CBC - Abnormal; Notable for the following:    WBC 10.8 (*)    RBC 4.02 (*)    Hemoglobin 12.1 (*)    HCT 35.4 (*)    All other components within normal limits  LIPASE, BLOOD  URINALYSIS, ROUTINE W REFLEX MICROSCOPIC (NOT AT [facility])    Imaging Review Ct Abdomen Pelvis W Contrast  02/15/2015  CLINICAL DATA:  Mid abdominal pain with abdominal swelling. EXAM: CT ABDOMEN AND PELVIS WITH CONTRAST TECHNIQUE: Multidetector CT imaging of the abdomen and pelvis was performed using the standard protocol following  bolus administration of intravenous contrast. CONTRAST:  OMNIPAQUE IOHEXOL 300 MG/ML  SOLN COMPARISON:  Radiographs earlier this evening. Chest radiographs 01/22/2015 and 09/06/2014 reviewed FINDINGS: Lower chest: Linear atelectasis within both lower lobes. Calcified granuloma in the right infrahilar lung. Coronary artery calcifications are seen. Liver: No focal lesion.  Normal in density. Hepatobiliary: Gallbladder physiologically distended. Tiny dependent gallstone suspected. No pericholecystic inflammatory change. No biliary ductal dilatation. Pancreas: Mild atrophy, no ductal dilatation or surrounding inflammation. Spleen: Normal. Adrenal glands: No nodule. Kidneys: Symmetric renal enhancement. No hydronephrosis. There is mild thinning of the left renal parenchyma. Bilateral renal cysts. Largest in the right measures 1.4 cm. Largest on the left measures 3.2 cm with a thin peripheral calcification. Stomach/Bowel: Postsurgical change the gastroesophageal junction. Small hiatal hernia. The stomach is decompressed. There are no dilated or thickened small bowel loops, small bowel loops are decompressed. Umbilical hernia contains normal appearing loops of small bowel. There is gaseous distention of colon from the cecum through the descending colon. Minimal liquid stool in the cecum and transverse colon. There is colonic tortuosity. No colonic wall thickening. No signs of colonic inflammation. Small to moderate stool in the distal colon with diverticulosis, no diverticulitis. Vascular/Lymphatic: Within the left mid abdominal mesentery are 2 adjacent calcified densities measure 2.3 and 1.8 cm. No surrounding inflammatory change. Additional small soft tissue granulomas are seen in the upper abdomen adjacent to the aortic hiatus of the diaphragm. No retroperitoneal adenopathy. Abdominal aorta is normal in caliber. Moderate calcified and noncalcified atheromatous plaque. Reproductive: Prostate gland is not well seen.  Bladder: Bladder physiologically distended, no wall thickening. Other: No free air, free fluid, or intra-abdominal fluid collection. Fat within both inguinal canals, left greater than right. Musculoskeletal: There are no acute or suspicious osseous abnormalities. The bones are under mineralized. Degenerative change in both hips with subchondral cysts, left greater than right. There is degenerative change throughout the spine with mild anterolisthesis of L4 on L5. IMPRESSION: 1. Diffuse gaseous distention of colon without signs of bowel inflammation or obstruction.  Minimal liquid stool in the proximal colon. There is diverticulosis of the distal colon without diverticulitis. 2. Small umbilical hernia containing nonobstructed small bowel. 3. Chronic findings include bilateral renal cysts, atherosclerosis, and sequela of prior granulomatous disease with calcified mesenteric lymph nodes. Electronically Signed   By: Rubye Oaks M.D.   On: 02/15/2015 00:59   Dg Abd Acute W/chest  02/14/2015  CLINICAL DATA:  Abdominal pain and distension EXAM: DG ABDOMEN ACUTE W/ 1V CHEST COMPARISON:  01/22/2015 FINDINGS: Cardiac shadow is stable. The lungs are well aerated bilaterally. Mild scarring is again seen. Changes of prior granulomatous disease are noted with multiple calcified hilar and mediastinal lymph nodes. Scattered large and small bowel gas is noted. The colon is mildly distended with some air-fluid levels identified. No definitive obstructive changes are seen. Rounded calcifications are noted in the left lower quadrant likely of a chronic nature. No free air is seen. IMPRESSION: Gaseous distension of the colon without definitive obstructive change. CT may be helpful for further evaluation. Electronically Signed   By: Alcide Clever M.D.   On: 02/14/2015 22:11   I have personally reviewed and evaluated these images and lab results as part of my medical decision-making.   EKG Interpretation None      MDM    Final diagnoses:  Abdominal distension    Abdominal pain and distention, without evidence for bacterial infection or metabolic instability. CT imaging ordered to evaluate for small bowel obstruction, which did not appear on the abdominal series.  Nursing Notes Reviewed/ Care Coordinated, and agree without changes. Applicable Imaging Reviewed.  Interpretation of Laboratory Data incorporated into ED treatment  Plan: As per oncoming provider team    Mancel Bale, MD 02/16/15 [phone]

## 2015-02-14 NOTE — ED Notes (Signed)
[facility] reports abdominal pt that started 3 days ago that has gotten progressivly worse. Tightness/pressure. Hx of hernia. Currently has one is unrepeaired now. BM regualr but smaller today. Pt has Afib, not currently in it. EMS 12 lead shows 1st degree block. Pt denies n/v or SOB.  20 LH   Pt came from [facility].

## 2015-02-15 ENCOUNTER — Emergency Department (HOSPITAL_COMMUNITY): Payer: Medicare Other

## 2015-02-15 DIAGNOSIS — R140 Abdominal distension (gaseous): Secondary | ICD-10-CM | POA: Diagnosis not present

## 2015-02-15 MED ORDER — MAGNESIUM CITRATE PO SOLN
1.0000 | Freq: Once | ORAL | Status: DC
  Filled 2015-02-15: qty 296

## 2015-02-15 MED ORDER — IOHEXOL 300 MG/ML  SOLN
100.0000 mL | Freq: Once | INTRAMUSCULAR | Status: AC | PRN
  Administered 2015-02-15: 100 mL via INTRAVENOUS

## 2015-02-15 NOTE — Discharge Instructions (Signed)
Constipation, Adult Mr. Benjamin Mccall, Your CT scan does not show an obstruction, you have a lot of gas.  See a primary care doctor within 3 days for close follow up.  If any symptoms worsen, come back to the ED immediately.  Thank you.  IMPRESSION: 1. Diffuse gaseous distention of colon without signs of bowel inflammation or obstruction. Minimal liquid stool in the proximal colon. There is diverticulosis of the distal colon without diverticulitis. 2. Small umbilical hernia containing nonobstructed small bowel. 3. Chronic findings include bilateral renal cysts, atherosclerosis, and sequela of prior granulomatous disease with calcified mesenteric lymph nodes. Constipation is when a person:  Poops (has a bowel movement) less than 3 times a week.  Has a hard time pooping.  Has poop that is dry, hard, or bigger than normal. HOME CARE   Eat foods with a lot of fiber in them. This includes fruits, vegetables, beans, and whole grains such as brown rice.  Avoid fatty foods and foods with a lot of sugar. This includes french fries, hamburgers, cookies, candy, and soda.  If you are not getting enough fiber from food, take products with added fiber in them (supplements).  Drink enough fluid to keep your pee (urine) clear or pale yellow.  Exercise on a regular basis, or as told by your doctor.  Go to the restroom when you feel like you need to poop. Do not hold it.  Only take medicine as told by your doctor. Do not take medicines that help you poop (laxatives) without talking to your doctor first. GET HELP RIGHT AWAY IF:   You have bright red blood in your poop (stool).  Your constipation lasts more than 4 days or gets worse.  You have belly (abdominal) or butt (rectal) pain.  You have thin poop (as thin as a pencil).  You lose weight, and it cannot be explained. MAKE SURE YOU:   Understand these instructions.  Will watch your condition.  Will get help right away if you are not doing  well or get worse.   This information is not intended to replace advice given to you by your health care provider. Make sure you discuss any questions you have with your health care provider.   Document Released: 09/30/2007 Document Revised: 05/04/2014 Document Reviewed: 01/23/2013 Elsevier Interactive Patient Education [employer]2016 [employer]

## 2015-02-15 NOTE — ED Provider Notes (Signed)
Patient signed out as pending CT scan for SBO.    IMPRESSION: 1. Diffuse gaseous distention of colon without signs of bowel inflammation or obstruction. Minimal liquid stool in the proximal colon. There is diverticulosis of the distal colon without diverticulitis. 2. Small umbilical hernia containing nonobstructed small bowel. 3. Chronic findings include bilateral renal cysts, atherosclerosis, and sequela of prior granulomatous disease with calcified mesenteric lymph nodes.  Patient given mag citrate in the ED prior to DC.  Instructed to increase miralax at home.  PCP fu advised within 3 days.  Abdeomen is soft but distended.  No tenderness.  He appears well and in NAD. VS remain within his normal limits and he is safe for DC.  Tomasita CrumbleAdeleke Qunisha Bryk, MD 02/15/15 [phone]

## 2015-02-18 ENCOUNTER — Ambulatory Visit (INDEPENDENT_AMBULATORY_CARE_PROVIDER_SITE_OTHER): Payer: Medicare Other | Admitting: Family

## 2015-02-18 ENCOUNTER — Encounter: Payer: Self-pay | Admitting: Family

## 2015-02-18 VITALS — BP 138/88 | HR 53 | Temp 97.9°F | Resp 18 | Ht 68.0 in | Wt 171.0 lb

## 2015-02-18 DIAGNOSIS — R140 Abdominal distension (gaseous): Secondary | ICD-10-CM | POA: Diagnosis not present

## 2015-02-18 DIAGNOSIS — I2510 Atherosclerotic heart disease of native coronary artery without angina pectoris: Secondary | ICD-10-CM

## 2015-02-18 HISTORY — DX: Abdominal distension (gaseous): R14.0

## 2015-02-18 MED ORDER — EZETIMIBE 10 MG PO TABS: 10.0000 mg | ORAL_TABLET | Freq: Every day | ORAL | Status: DC

## 2015-02-18 MED ORDER — SIMETHICONE 125 MG PO CHEW: 125.0000 mg | CHEWABLE_TABLET | Freq: Three times a day (TID) | ORAL | Status: AC

## 2015-02-18 MED ORDER — BUDESONIDE-FORMOTEROL FUMARATE 160-4.5 MCG/ACT IN AERO: 2.0000 | INHALATION_SPRAY | Freq: Two times a day (BID) | RESPIRATORY_TRACT | Status: DC

## 2015-02-18 MED ORDER — PANCRELIPASE (LIP-PROT-AMYL) 12000-38000 UNITS PO CPEP: 36000.0000 [IU] | ORAL_CAPSULE | Freq: Three times a day (TID) | ORAL | Status: DC

## 2015-02-18 NOTE — Progress Notes (Signed)
Subjective:    Patient ID: Benjamin Mccall, male    DOB: 14-Jul-1929, 81 y.o.   MRN: [id number]  Chief Complaint  Patient presents with  . Hospitalization Follow-up    has issues with his stomach, states that he kept feeling more and more bloated, still feeling bad, was told that he has alot of gas    HPI:  Benjamin Mccall is a 81 y.o. male who  has a past medical history of Diabetes mellitus without complication (HCC); GERD (gastroesophageal reflux disease); Hyperlipidemia; Hypertension; Hypothyroid; Chest pain; Atrial fibrillation (HCC); Asthma; and Hernia, hiatal. and presents today for an ED follow up.  Recently evaluated in the emergency room for abdominal pain and bloating. Physical exam reveals distention and a small hernia with generalized tenderness. CT scan revealed diffuse gaseous distention of the colon without inflammation or obstruction. Diverticulosis with no diverticulitis was noted. There were also chronic findings including bilateral renal cysts atherosclerosis and granulomatous disease. X-rays with similar findings of gaseous distention without definitive obstruction.  Since leaving the ED he continues to experience generalized abdominal pain and bloating which he notes is improved about 50% however he is having to wear a larger size of pants. Has had some loose stools since that time. Denies any nausea with some dry heaving and burping. Modifying factors include Miralax and the magneisum citrate which seem to help a little. Pain is described as sharp pain that comes in waves.   Allergies  Allergen Reactions  . Reglan [Metoclopramide]     Tremors   . Sulfa Antibiotics Other (See Comments)    shakes     Current Outpatient Prescriptions on File Prior to Visit  Medication Sig Dispense Refill  . albuterol (PROVENTIL HFA;VENTOLIN HFA) 108 (90 BASE) MCG/ACT inhaler Inhale 2 puffs into the lungs 2 (two) times daily as needed for wheezing or shortness of breath.    .  ALPRAZolam (XANAX) 0.25 MG tablet Take 0.25 mg by mouth at bedtime as needed for anxiety.    [name]. aspirin 81 MG tablet Take 81 mg by mouth daily.    [name]. atorvastatin (LIPITOR) 20 MG tablet Take 20 mg by mouth daily at 6 PM.     . Cholecalciferol (VITAMIN D3) 1000 UNITS CAPS Take 1,000 Units by mouth daily.     . citalopram (CELEXA) 10 MG tablet Take 10 mg by mouth daily.    . clopidogrel (PLAVIX) 75 MG tablet Take 75 mg by mouth daily.    [name]. diltiazem (CARDIZEM CD) 240 MG 24 hr capsule Take 240 mg by mouth daily.    . famotidine (PEPCID) 20 MG tablet One at bedtime (Patient taking differently: Take 20 mg by mouth at bedtime. One at bedtime)    . fluticasone (FLONASE) 50 MCG/ACT nasal spray Place 2 sprays into both nostrils as needed for allergies or rhinitis.    [name]. gabapentin (NEURONTIN) 300 MG capsule Take 300 mg by mouth at bedtime.    [name]. guaiFENesin (MUCINEX) 600 MG 12 hr tablet Take 600 mg by mouth 2 (two) times daily as needed for cough.     . hydrochlorothiazide (HYDRODIURIL) 25 MG tablet Take 25 mg by mouth daily.    [name]. ibuprofen (ADVIL,MOTRIN) 200 MG tablet Take 200 mg by mouth every 6 (six) hours as needed for mild pain or moderate pain.     [name]. levothyroxine (SYNTHROID, LEVOTHROID) 100 MCG tablet Take 100 mcg by mouth daily.    [name]. lisinopril (PRINIVIL,ZESTRIL) 2.5 MG tablet Take 1 tablet (2.5  mg total) by mouth daily. 90 tablet 0  . loratadine (CLARITIN) 10 MG tablet Take 10 mg by mouth daily as needed for allergies.    . metFORMIN (GLUCOPHAGE) 500 MG tablet Take 500 mg by mouth 2 (two) times daily with a meal.     . Multiple Vitamin (THERA-TABS PO) Take 1 tablet by mouth daily.     . nitroGLYCERIN (NITRODUR - DOSED IN MG/24 HR) 0.6 mg/hr patch Place 0.6 mg onto the skin daily.    . nitroGLYCERIN (NITROSTAT) 0.4 MG SL tablet Place 0.4 mg under the tongue as needed for chest pain.    [name] omeprazole (PRILOSEC) 20 MG capsule Take 20 mg by mouth daily.    . polyethylene glycol powder (GLYCOLAX/MIRALAX)  powder Take 1 Container by mouth daily.     . potassium chloride (MICRO-K) 10 MEQ CR capsule Take 10 mEq by mouth 2 (two) times daily.    . sodium chloride (DEEP SEA NASAL SPRAY) 0.65 % nasal spray Place 1 spray into the nose as needed for congestion.     No current facility-administered medications on file prior to visit.     Past Surgical History  Procedure Laterality Date  . Percutaneous coronary stent intervention (pci-s)    . Hiatal hernia repair    . Cataract extraction    . Appendectomy        Review of Systems  Constitutional: Negative for fever and chills.  Gastrointestinal: Positive for abdominal pain and abdominal distention. Negative for nausea, vomiting, diarrhea, constipation and blood in stool.      Objective:    BP 138/88 mmHg  Pulse 53  Temp(Src) 97.9 F (36.6 C) (Oral)  Resp 18  Ht  (1.727 m)  Wt 171 lb (77.565 kg)  BMI 26.01 kg/m2  SpO2 96% Nursing note and vital signs reviewed.  Physical Exam  Constitutional: He is oriented to person, place, and time. He appears well-developed and well-nourished. No distress.  Cardiovascular: Normal rate, regular rhythm, normal heart sounds and intact distal pulses.   Pulmonary/Chest: Effort normal and breath sounds normal.  Abdominal: Soft. Normal appearance and bowel sounds are normal. He exhibits distension and mass. There is no hepatosplenomegaly. There is generalized tenderness. There is no rebound and no guarding.  Neurological: He is alert and oriented to person, place, and time.  Skin: Skin is warm and dry.  Psychiatric: He has a normal mood and affect. His behavior is normal. Judgment and thought content normal.       Assessment & Plan:   Problem List Items Addressed This Visit      Other   Abdominal distention - Primary    Gaseous distention of undetermined origin with no evidence of inflammation or obstruction on CT scan. Some mild improvements over the past 4 days. Question possible pancreatic  insufficiency. Trial Creon. Start simethicone to reduce gaseous distention. Instructed to follow up if symptoms worsen, pain worsens or improvement is not noted within the next several days.       Relevant Medications   lipase/protease/amylase (CREON) 12000 UNITS CPEP capsule   simethicone (GAS-X EXTRA STRENGTH) 125 MG chewable tablet

## 2015-02-18 NOTE — Progress Notes (Signed)
Pre visit review using our clinic review tool, if applicable. No additional management support is needed unless otherwise documented below in the visit note. 

## 2015-02-18 NOTE — Assessment & Plan Note (Signed)
Gaseous distention of undetermined origin with no evidence of inflammation or obstruction on CT scan. Some mild improvements over the past 4 days. Question possible pancreatic insufficiency. Trial Creon. Start simethicone to reduce gaseous distention. Instructed to follow up if symptoms worsen, pain worsens or improvement is not noted within the next several days.

## 2015-02-18 NOTE — Patient Instructions (Addendum)
Thank you for choosing [facility].  Summary/Instructions:  Please continue to take your medications as prescribed.   Your prescription(s) have been submitted to your pharmacy or been printed and provided for you. Please take as directed and contact our office if you believe you are having problem(s) with the medication(s) or have any questions.  If your symptoms worsen or fail to improve, please contact our office for further instruction, or in case of emergency go directly to the emergency room at the closest medical facility.     

## 2015-03-11 ENCOUNTER — Telehealth: Payer: Self-pay | Admitting: Family

## 2015-03-11 DIAGNOSIS — R140 Abdominal distension (gaseous): Secondary | ICD-10-CM

## 2015-03-11 MED ORDER — PANCRELIPASE (LIP-PROT-AMYL) 12000-38000 UNITS PO CPEP: 36000.0000 [IU] | ORAL_CAPSULE | Freq: Three times a day (TID) | ORAL | Status: DC

## 2015-03-11 NOTE — Telephone Encounter (Signed)
Pt requesting refill for lipase/protease/amylase (CREON) 12000 UNITS CPEP capsule [[id number]][[id number]]   Pharmacy is [facility] in [place]

## 2015-03-11 NOTE — Telephone Encounter (Signed)
Rx sent 

## 2015-04-04 ENCOUNTER — Telehealth: Payer: Self-pay | Admitting: Family

## 2015-04-04 NOTE — Telephone Encounter (Signed)
Per previous note - it is most likely not necessary for him to stop the Plavix unless there is a complicated surgical procedure being performed.

## 2015-04-04 NOTE — Telephone Encounter (Signed)
States patient is going to have teeth extracted on 12/20 and needs to know when patient needs to stop taking plavix.

## 2015-04-04 NOTE — Telephone Encounter (Signed)
Please advise 

## 2015-04-05 NOTE — Telephone Encounter (Signed)
Fax sent to [email or web address] with note below

## 2015-04-10 ENCOUNTER — Telehealth: Payer: Self-pay | Admitting: Family

## 2015-04-10 ENCOUNTER — Encounter: Payer: Self-pay | Admitting: Family

## 2015-04-10 NOTE — Telephone Encounter (Signed)
This is Dr. Lubertha Basqueaylor's call as he is the one doing the procedure. Please have them contact Dr. Lubertha Basqueaylor's office. If this is a general extraction then the plavix does not need to be stopped.

## 2015-04-10 NOTE — Telephone Encounter (Signed)
Notified [place] spoke with Florentina AddisonKatie gave Tammy SoursGreg response...Raechel Chute/lmb

## 2015-04-10 NOTE — Telephone Encounter (Signed)
Patient is having a tooth extracted next week by Dr. Ladona Ridgelaylor.  Would like to know if patient needs to put plavix and aspirin on hold for any length of time before?  Needs order faxed [phone]

## 2015-04-23 ENCOUNTER — Ambulatory Visit (INDEPENDENT_AMBULATORY_CARE_PROVIDER_SITE_OTHER): Payer: Medicare Other | Admitting: Internal Medicine

## 2015-04-23 ENCOUNTER — Encounter: Payer: Self-pay | Admitting: Internal Medicine

## 2015-04-23 VITALS — BP 104/60 | HR 51 | Ht 68.0 in | Wt 170.0 lb

## 2015-04-23 DIAGNOSIS — I2510 Atherosclerotic heart disease of native coronary artery without angina pectoris: Secondary | ICD-10-CM | POA: Diagnosis not present

## 2015-04-23 DIAGNOSIS — J449 Chronic obstructive pulmonary disease, unspecified: Secondary | ICD-10-CM | POA: Diagnosis not present

## 2015-04-23 DIAGNOSIS — I10 Essential (primary) hypertension: Secondary | ICD-10-CM

## 2015-04-23 DIAGNOSIS — R05 Cough: Secondary | ICD-10-CM

## 2015-04-23 DIAGNOSIS — R058 Other specified cough: Secondary | ICD-10-CM

## 2015-04-23 NOTE — Patient Instructions (Addendum)
Stop lisinopril and I will let Dr Carver Filaalone know why it may be working against you  Keep up your regular walking schedule so you can let me know if you are losing any ground between visits  Please schedule a follow up visit in 3 months but call sooner if needed

## 2015-04-23 NOTE — Progress Notes (Signed)
Subjective:    Patient ID: Benjamin Mccall, male    DOB: 1929-09-28    MRN: [id number]    Brief patient profile:  [age] yowm quit smoking at dx of sarcoid at age 81 dx in [place] by surgeon with neck node with cough/sob rx prednisone x at least a decade with chronic doe ever since then x yardwork and leveled off d/c prednisone around age 81 referred to pulmonary 03/27/2014  by [facility] care with doe s  Cough     History of Present Illness  03/27/2014 1st [facility] office visit/ Kalyn Hofstra   Chief Complaint  Patient presents with  . Pulmonary Consult    Referred by [name], NP. Pt states had lung bx age 81 and was dxed with Sarcoid. [place]. He c/o SOB that comes and goes- sometimes gets out of breath when walking the dog and exposed to cold weather.   doe is chronic and more related to how heavy he exerts   Maybe better with symbicort ? Makes him hoarse  Has combivent but doesn't think it helps/ doesn't use it rec Try symbicort 160 one twice daily to see if you don't do just as well    06/29/2014 f/u ov/Sheri Prows re: copd GOLD II Chief Complaint  Patient presents with  . Follow-up    PFT done today. Pt states that his breathing seems to be doing some better. He is still using symbicort 2 puffs bid. No new co's today.   better and Not limited by breathing from desired activities      >>symbicort  09/07/2014  ER follow up  Patient returns for a emergency room follow-up Patient says that he went to the emergency room yesterday after feeling more short of breath over the last few days He has had more shortness of breath with activity He denies any chest pain, syncope, orthopnea, PND, or increased leg swelling Has had minimum dry cough, sinus drainage. Chest x-ray at the emergency room was negative Troponin was negative. rec Prednisone 20mg  daily for 5 days then 10mg  daily for 5 days and stop .  Mucinex DM Twice daily  As needed   Cough/congestion  Claritin 10mg  At bedtime  As needed  Drainage Saline nasal rinses As needed   Flonase 2 puff daily As needed      12/25/2014 f/u ov/Amor Hyle re: GOLD II copd/ on symbicort 160 2bid  Chief Complaint  Patient presents with  . Follow-up    Pt states his breathing is unchanged. He c/o "a lot of congestion from my sinuses"- has prod cough with minimal grey sputum.   can walk around complex s stopping x 45 min including some hills  Cough mostly daytime on prn prilosec worse with meals - sleeps ok and no need for saba but wants access to it in case he does (lives in assisted living and has to ask for it) rec Try reduce symbicort to 160 one twice daily  Add prilosec 20 mg Take 30-60 min before first meal of the day  GERD  Diet    01/22/2015  f/u ov/Jayliana Valencia re: GOLD II copd / symbicort 160 one twice daily  Chief Complaint  Patient presents with  . Follow-up    Having increased sob with exertion on high humidity,this am burping,sob,bp up this am,no dizzy,tremors in hands x 2 days,no fcs.No wheezing,cough-dry,PND.  even in ac on best days doe = MMRC2 = can't walk a nl pace on  a flat grade s sob/ some better with saba but then makes him shake. rec Add pepcid ac 20 mg at bedtime Continue the symbicort 160 one twice daily  Only use your albuterol as a rescue medication  04/23/2015  f/u ov/Herschell Virani re: copd II/ on acei on symbicort 160 one bid  Chief Complaint  Patient presents with  . Follow-up    Pt states his breathing is overall doing well. He has good days and bad. He is using albuterol once per wk on average.   has 2 patterns of doe > one reproducible with walking too fast on his 3 x daily walk with dogs/ esp if > nl  Pace or up hills = MMRC1 = can walk nl pace, flat grade, can't hurry or go uphills or steps s sob   The other is unpredictable and not related to walking / can occur at rest/ assoc with dry cough and hoarseness   No obvious day to day or daytime variability or assoc   cp or chest tightness, subjective wheeze or overt   hb symptoms. No unusual exp hx or h/o childhood pna/ asthma or knowledge of premature birth.  Sleeping ok without nocturnal  or early am exacerbation  of respiratory  c/o's or need for noct saba. Also denies any obvious fluctuation of symptoms with weather or environmental changes or other aggravating or alleviating factors except as outlined above   Current Medications, Allergies, Complete Past Medical History, Past Surgical History, Family History, and Social History were reviewed in [employer] record.  ROS  The following are not active complaints unless bolded sore throat, dysphagia, dental problems, itching, sneezing,  nasal congestion or excess/ purulent secretions, ear ache,   fever, chills, sweats, unintended wt loss, classically pleuritic or exertional cp, hemoptysis,  orthopnea pnd or leg swelling, presyncope, palpitations, abdominal pain, anorexia, nausea, vomiting, diarrhea  or change in bowel or bladder habits, change in stools or urine, dysuria,hematuria,  rash, arthralgias, visual complaints, headache, numbness, weakness or ataxia or problems with walking or coordination,  change in mood/affect or memory.                Objective:   Physical Exam  amb moderately  hoarse wm  nad    01/22/2015        174  > 04/23/2015  171     12/25/14 174 lb (78.926 kg)  11/12/14 168 lb 1.9 oz (76.259 kg)  07/26/14 172 lb (78.019 kg)    Vital signs reviewed     HEENT: nl dentition, turbinates, and orophanx. Nl external ear canals without cough reflex   NECK :  without JVD/Nodes/TM/ nl carotid upstrokes bilaterally   LUNGS: no acc muscle use, clear to A and P bilaterally without cough on insp or exp maneuvers   CV:  RRR  no s3 or murmur or increase in P2, no edema   ABD:  soft and nontender with nl excursion in the supine position. No bruits or organomegaly, bowel sounds nl  MS:  warm without  deformities, calf tenderness, cyanosis or clubbing  SKIN: warm and dry without lesions    NEURO:  alert, approp, no deficits noted.       I personally reviewed images and agree with radiology impression as follows:  CXR:    01/22/2015   Stable chest with evidence of prior granulomatous disease and mild hyperinflation. No acute findings.    Assessment & Plan:

## 2015-04-29 ENCOUNTER — Encounter: Payer: Self-pay | Admitting: Internal Medicine

## 2015-04-29 NOTE — Assessment & Plan Note (Signed)
-   rec daily ppi/ diet 12/25/2014 >>>  - acei rx 02/02/15 > d/c 04/23/2015    Classic Upper airway cough syndrome, so named because it's frequently impossible to sort out how much is  CR/sinusitis with freq throat clearing (which can be related to primary GERD)   vs  causing  secondary (" extra esophageal")  GERD from wide swings in gastric pressure that occur with throat clearing, often  promoting self use of mint and menthol lozenges that reduce the lower esophageal sphincter tone and exacerbate the problem further in a cyclical fashion.   These are the same pts (now being labeled as having "irritable larynx syndrome" by some cough centers) who not infrequently have a history of having failed to tolerate ace inhibitors,  dry powder inhalers or biphosphonates or report having atypical reflux symptoms that don't respond to standard doses of PPI , and are easily confused as having aecopd or asthma flares by even experienced allergists/ pulmonologists.  To reduce confusion re interpretation of non-specific resp symptoms in copd like cough/hoarsenes/ rec stop acei and use arb if needed

## 2015-04-29 NOTE — Assessment & Plan Note (Signed)
In the best review of chronic cough to date ( NEJM [id number]) ,  ACEi are now felt to cause cough in up to  20% of pts which is a 4 fold increase from previous reports and does not include the variety of non-specific complaints we see in pulmonary clinic in pts on ACEi but previously attributed to copd/asthma to include PNDS, throat and chest congestion, "bronchitis", unexplained dyspnea and noct "strangling" sensations as well as atypical /refractory GERD symptoms like atypical dysphagia.   The only way to prove this is not an "ACEi Case" is a trial off ACEi x a minimum of 6 weeks then regroup.   His bp is very low on 2.5 mg daily of lisinopril so i opted not to start arb and defer rx back to  Dr Tommie Samsalone's capable hands

## 2015-04-29 NOTE — Assessment & Plan Note (Signed)
-   12/25/2014 p extensive coaching HFA effectiveness =    90% > try to reduce symbicort to 160 one bid due to ? throat irritation  - 01/22/2015  Walked RA x 3 laps @ 185 ft each stopped due to  End of study, nl pace with cane , no sob or desat    Still has throat irritation but I suspect that's acei/ not symbiocort esp at dose of just one bid  I had an extended discussion with the patient reviewing all relevant studies completed to date and  lasting 15 to 20 minutes of a 25 minute visit    Each maintenance medication was reviewed in detail including most importantly the difference between maintenance and prns and under what circumstances the prns are to be triggered using an action plan format that is not reflected in the computer generated alphabetically organized AVS.    Please see instructions for details which were reviewed in writing and the patient given a copy highlighting the part that I personally wrote and discussed at today's ov.

## 2015-05-14 ENCOUNTER — Telehealth: Payer: Self-pay

## 2015-05-14 ENCOUNTER — Encounter: Payer: Self-pay | Admitting: Podiatry

## 2015-05-14 ENCOUNTER — Ambulatory Visit (INDEPENDENT_AMBULATORY_CARE_PROVIDER_SITE_OTHER): Payer: Medicare Other | Admitting: Podiatry

## 2015-05-14 DIAGNOSIS — M79676 Pain in unspecified toe(s): Secondary | ICD-10-CM

## 2015-05-14 DIAGNOSIS — E119 Type 2 diabetes mellitus without complications: Secondary | ICD-10-CM | POA: Diagnosis not present

## 2015-05-14 DIAGNOSIS — B351 Tinea unguium: Secondary | ICD-10-CM

## 2015-05-14 NOTE — Telephone Encounter (Signed)
Pt lm on triage. Requesting samples for symbicort.   Please advise if we have those samples. Pt stated that his rx for this medication is over $300.00

## 2015-05-14 NOTE — Progress Notes (Signed)
Patient ID: KARMINE KAUER, male   DOB: [birth year], 82 y.o.   MRN: [id number] Complaint:  Visit Type: Patient returns to my office for continued preventative foot care services. Complaint: Patient states" my nails have grown long and thick and become painful to walk and wear shoes" Patient has been diagnosed with DM with no complications. He presents for preventative foot care services. No changes to ROS  Podiatric Exam: Vascular: dorsalis pedis and posterior tibial pulses are palpable bilateral. Capillary return is immediate. Temperature gradient is WNL. Skin turgor WNL  Sensorium: Normal Semmes Weinstein monofilament test. Normal tactile sensation bilaterally. Nail Exam: Pt has thick disfigured discolored nails with subungual debris noted bilateral entire nail hallux through fifth toenails Ulcer Exam: There is no evidence of ulcer or pre-ulcerative changes or infection. Orthopedic Exam: Muscle tone and strength are WNL. No limitations in general ROM. No crepitus or effusions noted. Foot type and digits show no abnormalities. Bony prominences are unremarkable. Skin: No Porokeratosis. No infection or ulcers  Diagnosis:  Tinea unguium, Pain in right toe, pain in left toes  Treatment & Plan Procedures and Treatment: Consent by patient was obtained for treatment procedures. The patient understood the discussion of treatment and procedures well. All questions were answered thoroughly reviewed. Debridement of mycotic and hypertrophic toenails, 1 through 5 bilateral and clearing of subungual debris. No ulceration, no infection noted.  Return Visit-Office Procedure: Patient instructed to return to the office for a follow up visit 3 months for continued evaluation and treatment.   Helane Gunther DPM

## 2015-05-14 NOTE — Telephone Encounter (Signed)
Unfortunately I do not have any samples Symbicort. I would have him check with Pulmonology.

## 2015-05-16 ENCOUNTER — Telehealth: Payer: Self-pay | Admitting: Internal Medicine

## 2015-05-16 NOTE — Telephone Encounter (Signed)
LM x1 for patient.  

## 2015-05-16 NOTE — Telephone Encounter (Signed)
Sending message to Pulmonology.   Do you have any samples of Symbicort for pt?   Any help is appreciated.

## 2015-05-16 NOTE — Telephone Encounter (Signed)
Give one sample and set up ov with  NP's to select alternative but he must bring in formulary - my hope is we can taper inhalers once off acei x 6 weeks

## 2015-05-16 NOTE — Telephone Encounter (Signed)
Pt aware that samples placed up front. Scheduled with TP 05/20/15 at 1145 to review drug formulary and discuss alternative meds. Nothing further needed.

## 2015-05-16 NOTE — Telephone Encounter (Signed)
Pt aware that samples placed up front. Scheduled with TP 05/20/15 at 1145 to review drug formulary and discuss alternative meds. Nothing further needed.  Maisie Fus, CMA at 05/16/2015 8:56 AM     Status: Signed       Expand All Collapse All   LM x 1 for patient            Nyoka Cowden, MD at 05/16/2015 8:43 AM     Status: Signed       Expand All Collapse All   Give one sample and set up ov with NP's to select alternative but he must bring in formulary - my hope is we can taper inhalers once off acei x 6 weeks            Radford Pax, [place] at 05/16/2015 8:22 AM     Status: Signed       Expand All Collapse All   Sending message to Pulmonology.   Do you have any samples of Symbicort for pt?   Any help is appreciated.

## 2015-05-20 ENCOUNTER — Encounter: Payer: Self-pay | Admitting: Adult Health

## 2015-05-20 ENCOUNTER — Ambulatory Visit (INDEPENDENT_AMBULATORY_CARE_PROVIDER_SITE_OTHER): Payer: Medicare Other | Admitting: Adult Health

## 2015-05-20 ENCOUNTER — Telehealth: Payer: Self-pay | Admitting: Adult Health

## 2015-05-20 VITALS — BP 138/80 | HR 55 | Temp 98.1°F | Ht 68.0 in | Wt 174.0 lb

## 2015-05-20 DIAGNOSIS — R058 Other specified cough: Secondary | ICD-10-CM

## 2015-05-20 DIAGNOSIS — J449 Chronic obstructive pulmonary disease, unspecified: Secondary | ICD-10-CM

## 2015-05-20 DIAGNOSIS — R05 Cough: Secondary | ICD-10-CM

## 2015-05-20 MED ORDER — ARFORMOTEROL TARTRATE 15 MCG/2ML IN NEBU: 15.0000 ug | INHALATION_SOLUTION | Freq: Two times a day (BID) | RESPIRATORY_TRACT | Status: DC

## 2015-05-20 MED ORDER — BUDESONIDE 0.25 MG/2ML IN SUSP: 0.2500 mg | Freq: Two times a day (BID) | RESPIRATORY_TRACT | Status: DC

## 2015-05-20 NOTE — Progress Notes (Signed)
Chart and office note reviewed in detail  > agree with a/p as outlined    

## 2015-05-20 NOTE — Telephone Encounter (Signed)
Called # provided. Spoke with Darl Pikes. Was advised they do not have a danielle there. Was also advised not showing where anyone has called on this pt. Will sign off message

## 2015-05-20 NOTE — Assessment & Plan Note (Signed)
Compensated but unable to afford symbicort  Change to [facility] /Pulmicort   Plan  Change Symbicort to Budesonide and [employer] Twice daily  . follow up Dr. Sherene Sires  In 3 months and As needed

## 2015-05-20 NOTE — Addendum Note (Signed)
Addended by: Karalee Height on: 05/20/2015 04:39 PM   Modules accepted: Orders

## 2015-05-20 NOTE — Patient Instructions (Addendum)
Change Symbicort to Budesonide and Brovana Neb Twice daily  . follow up Dr. Sherene Sires  In 3 months and As needed

## 2015-05-20 NOTE — Assessment & Plan Note (Signed)
Improved off ACE  

## 2015-05-20 NOTE — Progress Notes (Signed)
Subjective:    Patient ID: Benjamin Mccall, male    DOB: 1929-09-28    MRN: [id number]    Brief patient profile:  [age] yowm quit smoking at dx of sarcoid at age 82 dx in [place] by surgeon with neck node with cough/sob rx prednisone x at least a decade with chronic doe ever since then x yardwork and leveled off d/c prednisone around age 82 referred to pulmonary 03/27/2014  by [facility] primary care with doe s  Cough     History of Present Illness  03/27/2014 1st [facility] office visit/ Wert   Chief Complaint  Patient presents with  . Pulmonary Consult    Referred by [name], NP. Pt states had lung bx age 82 and was dxed with Sarcoid. [place]. He c/o SOB that comes and goes- sometimes gets out of breath when walking the dog and exposed to cold weather.   doe is chronic and more related to how heavy he exerts   Maybe better with symbicort ? Makes him hoarse  Has combivent but doesn't think it helps/ doesn't use it rec Try symbicort 160 one twice daily to see if you don't do just as well    06/29/2014 f/u ov/Wert re: copd GOLD II Chief Complaint  Patient presents with  . Follow-up    PFT done today. Pt states that his breathing seems to be doing some better. He is still using symbicort 2 puffs bid. No new co's today.   better and Not limited by breathing from desired activities      >>symbicort  09/07/2014  ER follow up  Patient returns for a emergency room follow-up Patient says that he went to the emergency room yesterday after feeling more short of breath over the last few days He has had more shortness of breath with activity He denies any chest pain, syncope, orthopnea, PND, or increased leg swelling Has had minimum dry cough, sinus drainage. Chest x-ray at the emergency room was negative Troponin was negative. rec Prednisone 20mg  daily for 5 days then 10mg  daily for 5 days and stop .  Mucinex DM Twice daily  As needed   Cough/congestion  Claritin 10mg  At bedtime  As needed  Drainage Saline nasal rinses As needed   Flonase 2 puff daily As needed      12/25/2014 f/u ov/Wert re: GOLD II copd/ on symbicort 160 2bid  Chief Complaint  Patient presents with  . Follow-up    Pt states his breathing is unchanged. He c/o "a lot of congestion from my sinuses"- has prod cough with minimal grey sputum.   can walk around complex s stopping x 45 min including some hills  Cough mostly daytime on prn prilosec worse with meals - sleeps ok and no need for saba but wants access to it in case he does (lives in assisted living and has to ask for it) rec Try reduce symbicort to 160 one twice daily  Add prilosec 20 mg Take 30-60 min before first meal of the day  GERD  Diet    01/22/2015  f/u ov/Wert re: GOLD II copd / symbicort 160 one twice daily  Chief Complaint  Patient presents with  . Follow-up    Having increased sob with exertion on high humidity,this am burping,sob,bp up this am,no dizzy,tremors in hands x 2 days,no fcs.No wheezing,cough-dry,PND.  even in ac on best days doe = MMRC2 = can't walk a nl pace on  a flat grade s sob/ some better with saba but then makes him shake. rec Add pepcid ac 20 mg at bedtime Continue the symbicort 160 one twice daily  Only use your albuterol as a rescue medication  04/23/2015  f/u ov/Wert re: copd II/ on acei on symbicort 160 one bid  Chief Complaint  Patient presents with  . Follow-up    Pt states his breathing is overall doing well. He has good days and bad. He is using albuterol once per wk on average.   has 2 patterns of doe > one reproducible with walking too fast on his 3 x daily walk with dogs/ esp if > nl  Pace or up hills = MMRC1 = can walk nl pace, flat grade, can't hurry or go uphills or steps s sob   The other is unpredictable and not related to walking / can occur at rest/ assoc with dry cough and hoarseness  >>stop lisinotpril   05/20/2015 Follow up : COPD    Returns for 1 month follow up  ACE stopped last ov with decreased cough.  He wants to discuss new rx for symbicort as not affordable, over $300 mon .  He brought his formulary and no inhaler is lower than a 3rd tier.  We discussed changing to nebs with budesonide and brovana.  He denies chest pain, orthopnea, edema or fever, hemoptysis .   Current Medications, Allergies, Complete Past Medical History, Past Surgical History, Family History, and Social History were reviewed in [employer] record.  ROS  The following are not active complaints unless bolded sore throat, dysphagia, dental problems, itching, sneezing,  nasal congestion or excess/ purulent secretions, ear ache,   fever, chills, sweats, unintended wt loss, classically pleuritic or exertional cp, hemoptysis,  orthopnea pnd or leg swelling, presyncope, palpitations, abdominal pain, anorexia, nausea, vomiting, diarrhea  or change in bowel or bladder habits, change in stools or urine, dysuria,hematuria,  rash, arthralgias, visual complaints, headache, numbness, weakness or ataxia or problems with walking or coordination,  change in mood/affect or memory.            Objective:   Physical Exam  amb moderately  hoarse wm  nad    01/22/2015        174  > 04/23/2015  171 05/20/2015  174   Filed Vitals:   05/20/15 1155  BP: 138/80  Pulse: 55  Temp: 98.1 F (36.7 C)  TempSrc: Oral  Height:  (1.727 m)  Weight: 174 lb (78.926 kg)  SpO2: 96%   Vital signs reviewed     HEENT: nl dentition, turbinates, and orophanx. Nl external ear canals without cough reflex   NECK :  without JVD/Nodes/TM/ nl carotid upstrokes bilaterally   LUNGS: no acc muscle use, clear to A and P bilaterally without cough on insp or exp maneuvers   CV:  RRR  no s3 or murmur or increase in P2, no edema   ABD:  soft and nontender with nl excursion in the supine position. No bruits or organomegaly, bowel sounds nl  MS:  warm  without deformities, calf tenderness, cyanosis or clubbing  SKIN: warm and dry without lesions    NEURO:  alert, approp, no deficits noted.        CXR:    01/22/2015   Stable chest with evidence of prior granulomatous disease and mild hyperinflation. No acute findings.    Assessment & Plan:

## 2015-06-04 ENCOUNTER — Telehealth: Payer: Self-pay | Admitting: Internal Medicine

## 2015-06-04 MED ORDER — BUDESONIDE-FORMOTEROL FUMARATE 160-4.5 MCG/ACT IN AERO: 2.0000 | INHALATION_SPRAY | Freq: Two times a day (BID) | RESPIRATORY_TRACT | Status: DC

## 2015-06-04 NOTE — Telephone Encounter (Signed)
Spoke with pt, aware of results/recs.  Rx sent to preferred pharmacy, patient assistance forms mailed to pt at address on file.    Fax order to d/c neb meds and restart symbicort to [phone] attn: Med Dept at [facility].  This has been sent.  Nothing further needed.

## 2015-06-04 NOTE — Telephone Encounter (Signed)
Spoke with pt, states that since d/c'ing symbicort and switching to Pulmicort and Brovana nebs bid pt is having increased dizziness- dizziness used to be just after using neb, but is now more prolonged and persistent.  Pt is wishing to go back to Symbicort if possible-would like pt assistance forms mailed to him if MW is ok with this switch.  Verified address on file.  Pt uses [facility].    MW please advise on recs.  Thanks!    Patient Instructions     Change Symbicort to Budesonide and Brovana Neb Twice daily . follow up Dr. Sherene Sires In 3 months and As needed    (from ov with TP on 05/20/15)

## 2015-06-04 NOTE — Telephone Encounter (Signed)
Fine with me but in meantime rec use half the brovana vial as it is likely the cause of his symptoms

## 2015-07-22 ENCOUNTER — Ambulatory Visit (INDEPENDENT_AMBULATORY_CARE_PROVIDER_SITE_OTHER): Payer: Medicare Other | Admitting: Internal Medicine

## 2015-07-22 ENCOUNTER — Encounter: Payer: Self-pay | Admitting: Internal Medicine

## 2015-07-22 VITALS — BP 112/60 | HR 56 | Ht 68.0 in | Wt 169.0 lb

## 2015-07-22 DIAGNOSIS — R05 Cough: Secondary | ICD-10-CM

## 2015-07-22 DIAGNOSIS — R058 Other specified cough: Secondary | ICD-10-CM

## 2015-07-22 DIAGNOSIS — J449 Chronic obstructive pulmonary disease, unspecified: Secondary | ICD-10-CM

## 2015-07-22 NOTE — Patient Instructions (Addendum)
No change in treatment  Please schedule a follow up office visit in 4 weeks, sooner if needed to See Tammy re your medications but you need your drug formulary

## 2015-07-22 NOTE — Progress Notes (Signed)
Subjective:    Patient ID: Benjamin Mccall, male    DOB: 1929-09-28    MRN: [id number]    Brief patient profile:  [age] yowm quit smoking at dx of sarcoid at age 82 dx in [place] by surgeon with neck node with cough/sob rx prednisone x at least a decade with chronic doe ever since then x yardwork and leveled off d/c prednisone around age 82 referred to pulmonary 03/27/2014  by [facility] primary care with doe s  Cough     History of Present Illness  03/27/2014 1st [facility] office visit/ Juliene Kirsh   Chief Complaint  Patient presents with  . Pulmonary Consult    Referred by [name], NP. Pt states had lung bx age 82 and was dxed with Sarcoid. [place]. He c/o SOB that comes and goes- sometimes gets out of breath when walking the dog and exposed to cold weather.   doe is chronic and more related to how heavy he exerts   Maybe better with symbicort ? Makes him hoarse  Has combivent but doesn't think it helps/ doesn't use it rec Try symbicort 160 one twice daily to see if you don't do just as well    06/29/2014 f/u ov/Anira Senegal re: copd GOLD II Chief Complaint  Patient presents with  . Follow-up    PFT done today. Pt states that his breathing seems to be doing some better. He is still using symbicort 2 puffs bid. No new co's today.   better and Not limited by breathing from desired activities      >>symbicort  09/07/2014  ER follow up  Patient returns for a emergency room follow-up Patient says that he went to the emergency room yesterday after feeling more short of breath over the last few days He has had more shortness of breath with activity He denies any chest pain, syncope, orthopnea, PND, or increased leg swelling Has had minimum dry cough, sinus drainage. Chest x-ray at the emergency room was negative Troponin was negative. rec Prednisone 20mg  daily for 5 days then 10mg  daily for 5 days and stop .  Mucinex DM Twice daily  As needed   Cough/congestion  Claritin 10mg  At bedtime  As needed  Drainage Saline nasal rinses As needed   Flonase 2 puff daily As needed      12/25/2014 f/u ov/Calynn Ferrero re: GOLD II copd/ on symbicort 160 2bid  Chief Complaint  Patient presents with  . Follow-up    Pt states his breathing is unchanged. He c/o "a lot of congestion from my sinuses"- has prod cough with minimal grey sputum.   can walk around complex s stopping x 45 min including some hills  Cough mostly daytime on prn prilosec worse with meals - sleeps ok and no need for saba but wants access to it in case he does (lives in assisted living and has to ask for it) rec Try reduce symbicort to 160 one twice daily  Add prilosec 20 mg Take 30-60 min before first meal of the day  GERD  Diet    01/22/2015  f/u ov/Kaleigha Chamberlin re: GOLD II copd / symbicort 160 one twice daily  Chief Complaint  Patient presents with  . Follow-up    Having increased sob with exertion on high humidity,this am burping,sob,bp up this am,no dizzy,tremors in hands x 2 days,no fcs.No wheezing,cough-dry,PND.  even in ac on best days doe = MMRC2 = can't walk a nl pace on  a flat grade s sob/ some better with saba but then makes him shake. rec Add pepcid ac 20 mg at bedtime Continue the symbicort 160 one twice daily  Only use your albuterol as a rescue medication  04/23/2015  f/u ov/Ronson Hagins re: copd II/ on acei on symbicort 160 one bid  Chief Complaint  Patient presents with  . Follow-up    Pt states his breathing is overall doing well. He has good days and bad. He is using albuterol once per wk on average.   has 2 patterns of doe > one reproducible with walking too fast on his 3 x daily walk with dogs/ esp if > nl  Pace or up hills = MMRC1 = can walk nl pace, flat grade, can't hurry or go uphills or steps s sob   The other is unpredictable and not related to walking / can occur at rest/ assoc with dry cough and hoarseness  rec Stop lisinopril and I will let Dr Carver Fila know why  it may be working against you Keep up your regular walking schedule so you can let me know if you are losing any ground between visits   05/20/15 NP ov rec Change Symbicort to Budesonide and Brovana Neb Twice daily  > did not feel it worked as well     07/22/2015  f/u ov/Lavoris Canizales re: copd II/ maint rx symbicort 160 2bid / rarely need  saba Chief Complaint  Patient presents with  . Follow-up    Breathing is overall doing well. He c/o watery eyes and sinus drainage- relates to pollen. He is coughing no more than usual. He uses albuterol 2 x per wk on average.   only symptoms are his typical spring allergies/ already on flonase/otc's working about "as well as ever".  No obvious day to day or daytime variability or assoc  cp or chest tightness, subjective wheeze or overt   hb symptoms. No unusual exp hx or h/o childhood pna/ asthma or knowledge of premature birth.  Sleeping ok without nocturnal  or early am exacerbation  of respiratory  c/o's or need for noct saba. Also denies any obvious fluctuation of symptoms with weather or environmental changes or other aggravating or alleviating factors except as outlined above   Current Medications, Allergies, Complete Past Medical History, Past Surgical History, Family History, and Social History were reviewed in [employer] record.  ROS  The following are not active complaints unless bolded sore throat, dysphagia, dental problems, itching, sneezing,  nasal congestion or excess/ purulent secretions, ear ache,   fever, chills, sweats, unintended wt loss, classically pleuritic or exertional cp, hemoptysis,  orthopnea pnd or leg swelling, presyncope, palpitations, abdominal pain, anorexia, nausea, vomiting, diarrhea  or change in bowel or bladder habits, change in stools or urine, dysuria,hematuria,  rash, arthralgias, visual complaints, headache, numbness, weakness or ataxia or problems with walking or coordination,  change in mood/affect  or memory.                Objective:   Physical Exam  amb   wm  nad walks with cane    01/22/2015        174  > 04/23/2015  171 > 07/22/2015 169    12/25/14 174 lb (78.926 kg)  11/12/14 168 lb 1.9 oz (76.259 kg)  07/26/14 172 lb (78.019 kg)    Vital signs reviewed     HEENT: nl dentition, turbinates, and orophanx. Nl external ear canals without cough reflex   NECK :  without JVD/Nodes/TM/ nl carotid upstrokes bilaterally   LUNGS: no acc muscle use, clear to A and P bilaterally without cough on insp or exp maneuvers   CV:  RRR  no s3 or murmur or increase in P2, no edema   ABD:  soft and nontender with nl excursion in the supine position. No bruits or organomegaly, bowel sounds nl  MS:  warm without deformities, calf tenderness, cyanosis or clubbing  SKIN: warm and dry without lesions    NEURO:  alert, approp, no deficits noted.       I personally reviewed images and agree with radiology impression as follows:  CXR:    01/22/2015  Stable chest with evidence of prior granulomatous disease and mild hyperinflation. No acute findings.    Assessment & Plan:

## 2015-07-25 ENCOUNTER — Encounter: Payer: Self-pay | Admitting: Family

## 2015-07-25 ENCOUNTER — Other Ambulatory Visit (INDEPENDENT_AMBULATORY_CARE_PROVIDER_SITE_OTHER): Payer: Medicare Other

## 2015-07-25 ENCOUNTER — Ambulatory Visit (INDEPENDENT_AMBULATORY_CARE_PROVIDER_SITE_OTHER): Payer: Medicare Other | Admitting: Family

## 2015-07-25 VITALS — BP 148/68 | HR 53 | Temp 98.2°F | Resp 14 | Ht 68.0 in | Wt 170.0 lb

## 2015-07-25 DIAGNOSIS — E119 Type 2 diabetes mellitus without complications: Secondary | ICD-10-CM

## 2015-07-25 DIAGNOSIS — I10 Essential (primary) hypertension: Secondary | ICD-10-CM

## 2015-07-25 DIAGNOSIS — R140 Abdominal distension (gaseous): Secondary | ICD-10-CM | POA: Diagnosis not present

## 2015-07-25 DIAGNOSIS — R258 Other abnormal involuntary movements: Secondary | ICD-10-CM

## 2015-07-25 DIAGNOSIS — R251 Tremor, unspecified: Secondary | ICD-10-CM | POA: Insufficient documentation

## 2015-07-25 LAB — BASIC METABOLIC PANEL
BUN: 29 mg/dL — ABNORMAL HIGH (ref 6–23)
CALCIUM: 9.3 mg/dL (ref 8.4–10.5)
CO2: 31 meq/L (ref 19–32)
CREATININE: 1.47 mg/dL (ref 0.40–1.50)
Chloride: 102 mEq/L (ref 96–112)
GFR: 48.6 mL/min — ABNORMAL LOW (ref >60.00)
Glucose, Bld: 112 mg/dL — ABNORMAL HIGH (ref 70–99)
Potassium: 4.6 mEq/L (ref 3.5–5.1)
SODIUM: 138 meq/L (ref 135–145)

## 2015-07-25 LAB — HEMOGLOBIN A1C: HEMOGLOBIN A1C: 6.4 % (ref 4.6–6.5)

## 2015-07-25 MED ORDER — ZENPEP 40000-136000 UNITS PO CPEP: 5.0000 | ORAL_CAPSULE | Freq: Four times a day (QID) | ORAL | Status: DC

## 2015-07-25 NOTE — Assessment & Plan Note (Signed)
Occasional tremors appear benign and waxing and waning. Continue to monitor at this time. Do not appear to interfere with lifestyle. If symptoms worsen consider referral to neurology for further assessment.

## 2015-07-25 NOTE — Progress Notes (Signed)
Subjective:    Patient ID: Benjamin Mccall, male    DOB: December 15, 1929, 82 y.o.   MRN: [id number]  Chief Complaint  Patient presents with  . Follow-up    still has issues with bloating on and off and has the shakes    HPI:  Benjamin Mccall is a 82 y.o. male who  has a past medical history of Diabetes mellitus without complication (HCC); GERD (gastroesophageal reflux disease); Hyperlipidemia; Hypertension; Hypothyroid; Chest pain; Atrial fibrillation (HCC); Asthma; and Hernia, hiatal. and presents today for a follow up office visit.    1.) Diabetes - Currently maintained on metformin. Reports taking the medication as prescribed and denies adverse side effects. Reports his blood sugars at home are running good except when he cheats. Denies symptoms of end organ damage.  Lab Results  Component Value Date   HGBA1C 6.4 07/25/2015   2.) Bloating / Gas - Currently maintain on Creon. Reports taking the medication as prescribed and denies adverse side effects. Reports his symptoms are improved with the medication. Also notes the Creon is expensive and would like to know if there is an alternative.  3.) Hand tremors - Associated symptom of shakiness has been going on for several months in both hands on occasion that come and go.   Allergies  Allergen Reactions  . Reglan [Metoclopramide]     Tremors   . Sulfa Antibiotics Other (See Comments)    shakes     Current Outpatient Prescriptions on File Prior to Visit  Medication Sig Dispense Refill  . albuterol (PROVENTIL HFA;VENTOLIN HFA) 108 (90 BASE) MCG/ACT inhaler Inhale 2 puffs into the lungs 2 (two) times daily as needed for wheezing or shortness of breath.    . ALPRAZolam (XANAX) 0.25 MG tablet Take 0.25 mg by mouth at bedtime as needed for anxiety.    [name] aspirin 81 MG tablet Take 81 mg by mouth daily.    [name] atorvastatin (LIPITOR) 20 MG tablet Take 20 mg by mouth daily at 6 PM.     . budesonide-formoterol (SYMBICORT) 160-4.5 MCG/ACT  inhaler Inhale 2 puffs into the lungs 2 (two) times daily. 1 Inhaler 11  . Cholecalciferol (VITAMIN D3) 1000 UNITS CAPS Take 1,000 Units by mouth daily.     . citalopram (CELEXA) 10 MG tablet Take 10 mg by mouth daily.    . clopidogrel (PLAVIX) 75 MG tablet Take 75 mg by mouth daily.    [name] diltiazem (CARDIZEM CD) 240 MG 24 hr capsule Take 240 mg by mouth daily.    [name] ezetimibe (ZETIA) 10 MG tablet Take 1 tablet (10 mg total) by mouth daily. 30 tablet 0  . famotidine (PEPCID) 20 MG tablet One at bedtime (Patient taking differently: Take 20 mg by mouth at bedtime. One at bedtime)    . fluticasone (FLONASE) 50 MCG/ACT nasal spray Place 2 sprays into both nostrils as needed for allergies or rhinitis.    [name] gabapentin (NEURONTIN) 300 MG capsule Take 300 mg by mouth at bedtime.    [name] guaiFENesin (MUCINEX) 600 MG 12 hr tablet Take 600 mg by mouth 2 (two) times daily as needed for cough.     . hydrochlorothiazide (HYDRODIURIL) 25 MG tablet Take 25 mg by mouth daily.    [name] ibuprofen (ADVIL,MOTRIN) 200 MG tablet Take 200 mg by mouth every 6 (six) hours as needed for mild pain or moderate pain.     [name] levothyroxine (SYNTHROID, LEVOTHROID) 100 MCG tablet Take 100 mcg by mouth daily.    [name]  loratadine (CLARITIN) 10 MG tablet Take 10 mg by mouth daily as needed for allergies.    . metFORMIN (GLUCOPHAGE) 500 MG tablet Take 500 mg by mouth 2 (two) times daily with a meal.     . Multiple Vitamin (THERA-TABS PO) Take 1 tablet by mouth daily.     . nitroGLYCERIN (NITRODUR - DOSED IN MG/24 HR) 0.6 mg/hr patch Place 0.6 mg onto the skin daily.    . nitroGLYCERIN (NITROSTAT) 0.4 MG SL tablet Place 0.4 mg under the tongue as needed for chest pain.    [name]. omeprazole (PRILOSEC) 20 MG capsule Take 20 mg by mouth daily.    . polyethylene glycol powder (GLYCOLAX/MIRALAX) powder Take 1 Container by mouth daily.     . potassium chloride (MICRO-K) 10 MEQ CR capsule Take 10 mEq by mouth 2 (two) times daily.    . simethicone (GAS-X EXTRA  STRENGTH) 125 MG chewable tablet Chew 1-2 tablets (125-250 mg total) by mouth 3 (three) times daily with meals. Max 4 tablets daily 30 tablet 0  . sodium chloride (DEEP SEA NASAL SPRAY) 0.65 % nasal spray Place 1 spray into the nose as needed for congestion. Reported on 05/20/2015     No current facility-administered medications on file prior to visit.     Past Surgical History  Procedure Laterality Date  . Percutaneous coronary stent intervention (pci-s)    . Hiatal hernia repair    . Cataract extraction    . Appendectomy      Past Medical History  Diagnosis Date  . Diabetes mellitus without complication (HCC)   . GERD (gastroesophageal reflux disease)   . Hyperlipidemia   . Hypertension   . Hypothyroid   . Chest pain   . Atrial fibrillation (HCC)   . Asthma   . Hernia, hiatal     Review of Systems  Constitutional: Negative for fever and chills.  Respiratory: Negative for chest tightness and shortness of breath.   Cardiovascular: Negative for chest pain, palpitations and leg swelling.  Neurological: Negative for headaches.      Objective:    BP 148/68 mmHg  Pulse 53  Temp(Src) 98.2 F (36.8 C) (Oral)  Resp 14  Ht 5\' 8"  (1.727 m)  Wt 170 lb (77.111 kg)  BMI 25.85 kg/m2  SpO2 96% Nursing note and vital signs reviewed.  Physical Exam  Constitutional: He is oriented to person, place, and time. He appears well-developed and well-nourished. No distress.  Cardiovascular: Normal rate, normal heart sounds and intact distal pulses.  An irregularly irregular rhythm present.  Pulmonary/Chest: Effort normal and breath sounds normal.  Neurological: He is alert and oriented to person, place, and time.  There is mild shakiness of hands but no obvious tremors noted during exam.   Skin: Skin is warm and dry.  Psychiatric: He has a normal mood and affect. His behavior is normal. Judgment and thought content normal.       Assessment & Plan:   Problem List Items Addressed This  Visit      Endocrine   Controlled type 2 diabetes mellitus (HCC) - Primary    Type 2 diabetes well controlled with current regimen of metformin. No symptoms of end organ damage. Diabetic foot exam completed today. Pneumonia vaccinations are up-to-date. Decrease blood sugar checks to one time daily. Obtain A1c and basic metabolic panel. Maintained on atorvastatin for CAD risk reduction. Continue current dosage of metformin. Follow-up pending A1c results.      Relevant Orders   Hemoglobin A1c (  Completed)   Basic Metabolic Panel (BMET) (Completed)     Other   Bloating    Bloating improved with Creon however due to the expense patient requesting to change medications. Continue current dosage of simethicone. We will investigate Zenpep as alternative. Follow up if symptoms worsen or do remain controlled.       Relevant Medications   ZENPEP 40000 units CPEP   Occasional tremors    Occasional tremors appear benign and waxing and waning. Continue to monitor at this time. Do not appear to interfere with lifestyle. If symptoms worsen consider referral to neurology for further assessment.

## 2015-07-25 NOTE — Assessment & Plan Note (Signed)
Bloating improved with Creon however due to the expense patient requesting to change medications. Continue current dosage of simethicone. We will investigate Zenpep as alternative. Follow up if symptoms worsen or do remain controlled.

## 2015-07-25 NOTE — Assessment & Plan Note (Signed)
Type 2 diabetes well controlled with current regimen of metformin. No symptoms of end organ damage. Diabetic foot exam completed today. Pneumonia vaccinations are up-to-date. Decrease blood sugar checks to one time daily. Obtain A1c and basic metabolic panel. Maintained on atorvastatin for CAD risk reduction. Continue current dosage of metformin. Follow-up pending A1c results.

## 2015-07-25 NOTE — Patient Instructions (Addendum)
Thank you for choosing [facility].  Summary/Instructions:  Decreased blood sugar checks to one time daily in the morning.  Discontinue taking Creon one year current supply runs out.  We will check to see if Zenpep is affordable.  Continue current dosage of simethacone as needed for gas/bloating.  If your symptoms worsen or fail to improve, please contact our office for further instruction, or in case of emergency go directly to the emergency room at the closest medical facility.

## 2015-07-26 ENCOUNTER — Telehealth: Payer: Self-pay | Admitting: Family

## 2015-07-26 MED ORDER — ZENPEP 40000-136000 UNITS PO CPEP: 5.0000 | ORAL_CAPSULE | Freq: Four times a day (QID) | ORAL | Status: DC

## 2015-07-26 NOTE — Telephone Encounter (Signed)
Please inform patient that his A1c is 6.4 which is up a little. Otherwise his kidney function and electrolytes are within the normal limits.

## 2015-07-26 NOTE — Addendum Note (Signed)
Addended by: Jeanine LuzALONE, GREGORY D on: 07/26/2015 10:11 AM   Modules accepted: Orders

## 2015-07-26 NOTE — Addendum Note (Signed)
Addended by: Jeanine LuzALONE, Gizel Riedlinger D on: 07/26/2015 11:55 AM   Modules accepted: Orders

## 2015-07-28 NOTE — Assessment & Plan Note (Signed)
-   rec daily ppi/ diet 12/25/2014 >>>  - acei rx 02/02/15 > d/c 04/23/2015   Mild flare with rhinitis/ reviewed rx

## 2015-07-28 NOTE — Assessment & Plan Note (Signed)
pfts 06/29/14  FEV1   1.70 (72%) ratio 66 and DLCO 65 corrects to 99  - 12/25/2014 p extensive coaching HFA effectiveness =    90% > try to reduce symbicort to 160 one bid due to ? throat irritation  - 01/22/2015  Walked RA x 3 laps @ 185 ft each stopped due to  End of study, nl pace with cane , no sob or desat    Adequate control on present rx, reviewed > no change in rx needed    Each maintenance medication was reviewed in detail including most importantly the difference between maintenance and as needed and under what circumstances the prns are to be used.  Please see instructions for details which were reviewed in writing and the patient given a copy.

## 2015-07-29 NOTE — Telephone Encounter (Signed)
Pt aware of results 

## 2015-08-13 ENCOUNTER — Ambulatory Visit: Payer: Medicare Other | Admitting: Podiatry

## 2015-08-20 ENCOUNTER — Encounter: Payer: Self-pay | Admitting: Adult Health

## 2015-08-20 ENCOUNTER — Ambulatory Visit (INDEPENDENT_AMBULATORY_CARE_PROVIDER_SITE_OTHER): Payer: Medicare Other | Admitting: Adult Health

## 2015-08-20 VITALS — BP 140/66 | HR 51 | Temp 97.6°F | Ht 68.0 in | Wt 170.0 lb

## 2015-08-20 DIAGNOSIS — J449 Chronic obstructive pulmonary disease, unspecified: Secondary | ICD-10-CM

## 2015-08-20 DIAGNOSIS — D869 Sarcoidosis, unspecified: Secondary | ICD-10-CM

## 2015-08-20 NOTE — Progress Notes (Signed)
Chart and office note reviewed in detail  > agree with a/p as outlined    

## 2015-08-20 NOTE — Assessment & Plan Note (Signed)
Continue on current regimen .   

## 2015-08-20 NOTE — Assessment & Plan Note (Signed)
Compensated without flare 

## 2015-08-20 NOTE — Addendum Note (Signed)
Addended by: Karalee HeightOX, Makalyn Lennox P on: 08/20/2015 12:18 PM   Modules accepted: Orders, Medications

## 2015-08-20 NOTE — Progress Notes (Signed)
Subjective:    Patient ID: Benjamin PasseyDonald R Scheier, male    DOB: 1929-09-28    MRN: [id number]    Brief patient profile:  [age] yowm quit smoking at dx of sarcoid at age 82 dx in [place] by surgeon with neck node with cough/sob rx prednisone x at least a decade with chronic doe ever since then x yardwork and leveled off d/c prednisone around age 82 referred to pulmonary 03/27/2014  by [facility] primary care with doe s  Cough     History of Present Illness  03/27/2014 1st [facility] office visit/ Wert   Chief Complaint  Patient presents with  . Pulmonary Consult    Referred by [name], NP. Pt states had lung bx age 82 and was dxed with Sarcoid. [place]. He c/o SOB that comes and goes- sometimes gets out of breath when walking the dog and exposed to cold weather.   doe is chronic and more related to how heavy he exerts   Maybe better with symbicort ? Makes him hoarse  Has combivent but doesn't think it helps/ doesn't use it rec Try symbicort 160 one twice daily to see if you don't do just as well    06/29/2014 f/u ov/Wert re: copd GOLD II Chief Complaint  Patient presents with  . Follow-up    PFT done today. Pt states that his breathing seems to be doing some better. He is still using symbicort 2 puffs bid. No new co's today.   better and Not limited by breathing from desired activities      >>symbicort  09/07/2014  ER follow up  Patient returns for a emergency room follow-up Patient says that he went to the emergency room yesterday after feeling more short of breath over the last few days He has had more shortness of breath with activity He denies any chest pain, syncope, orthopnea, PND, or increased leg swelling Has had minimum dry cough, sinus drainage. Chest x-ray at the emergency room was negative Troponin was negative. rec Prednisone 20mg  daily for 5 days then 10mg  daily for 5 days and stop .  Mucinex DM Twice daily  As needed   Cough/congestion  Claritin 10mg  At bedtime  As needed  Drainage Saline nasal rinses As needed   Flonase 2 puff daily As needed      12/25/2014 f/u ov/Wert re: GOLD II copd/ on symbicort 160 2bid  Chief Complaint  Patient presents with  . Follow-up    Pt states his breathing is unchanged. He c/o "a lot of congestion from my sinuses"- has prod cough with minimal grey sputum.   can walk around complex s stopping x 45 min including some hills  Cough mostly daytime on prn prilosec worse with meals - sleeps ok and no need for saba but wants access to it in case he does (lives in assisted living and has to ask for it) rec Try reduce symbicort to 160 one twice daily  Add prilosec 20 mg Take 30-60 min before first meal of the day  GERD  Diet    01/22/2015  f/u ov/Wert re: GOLD II copd / symbicort 160 one twice daily  Chief Complaint  Patient presents with  . Follow-up    Having increased sob with exertion on high humidity,this am burping,sob,bp up this am,no dizzy,tremors in hands x 2 days,no fcs.No wheezing,cough-dry,PND.  even in ac on best days doe = MMRC2 = can't walk a nl pace on  a flat grade s sob/ some better with saba but then makes him shake. rec Add pepcid ac 20 mg at bedtime Continue the symbicort 160 one twice daily  Only use your albuterol as a rescue medication  04/23/2015  f/u ov/Wert re: copd II/ on acei on symbicort 160 one bid  Chief Complaint  Patient presents with  . Follow-up    Pt states his breathing is overall doing well. He has good days and bad. He is using albuterol once per wk on average.   has 2 patterns of doe > one reproducible with walking too fast on his 3 x daily walk with dogs/ esp if > nl  Pace or up hills = MMRC1 = can walk nl pace, flat grade, can't hurry or go uphills or steps s sob   The other is unpredictable and not related to walking / can occur at rest/ assoc with dry cough and hoarseness  rec Stop lisinopril and I will let Dr Carver Fila know why  it may be working against you Keep up your regular walking schedule so you can let me know if you are losing any ground between visits   05/20/15 NP ov rec Change Symbicort to Budesonide and Brovana Neb Twice daily  > did not feel it worked as well     07/22/2015  f/u ov/Wert re: copd II/ maint rx symbicort 160 2bid / rarely need  saba Chief Complaint  Patient presents with  . Follow-up    Breathing is overall doing well. He c/o watery eyes and sinus drainage- relates to pollen. He is coughing no more than usual. He uses albuterol 2 x per wk on average.   only symptoms are his typical spring allergies/ already on flonase/otc's working about "as well as ever". >>no changes   08/20/2015  Follow up : COPD II  Pt returns for 4 week follow up and med review  We reviewed all medications and organized them into a med calendar with pt education  Live in assited living , Medicine is given by nurse.  Appears to be taking correctly . Currently on , feels breathing is doing okay . Stays active . Gets winded sometimes if he is going up a hill.  Back on Symbicort . Did not like the Brovana and Pulmicort .  Denies chest pain, orthopnea, edema or fever.  PVX and Prevnar , TDAP utd.      Current Medications, Allergies, Complete Past Medical History, Past Surgical History, Family History, and Social History were reviewed in [employer] record.  ROS  The following are not active complaints unless bolded sore throat, dysphagia, dental problems,  or excess/ purulent secretions, ear ache,   fever, chills, sweats, unintended wt loss, classically pleuritic or exertional cp, hemoptysis,  orthopnea pnd or leg swelling, presyncope, palpitations, abdominal pain, anorexia, nausea, vomiting, diarrhea  or change in bowel or bladder habits, change in stools or urine, dysuria,hematuria,  rash, arthralgias, visual complaints, headache, numbness, weakness or ataxia or problems with walking or  coordination,  change in mood/affect or memory.                Objective:   Physical Exam  amb   wm  nad walks with cane    01/22/2015        174  > 04/23/2015  171 > 07/22/2015 16 . Filed Vitals:   08/20/15 1058  BP: 140/66  Pulse: 51  Temp: 97.6 F (36.4 C)  TempSrc: Oral  Height:  5\' 8"  (1.727 m)  Weight: 170 lb (77.111 kg)  SpO2: 97%      Vital signs reviewed     HEENT: nl dentition, turbinates, and orophanx. Nl external ear canals without cough reflex   NECK :  without JVD/Nodes/TM/ nl carotid upstrokes bilaterally   LUNGS: no acc muscle use, clear to A and P bilaterally without cough on insp or exp maneuvers   CV:  RRR  no s3 or murmur or increase in P2, no edema   ABD:  soft and nontender with nl excursion in the supine position. No bruits or organomegaly, bowel sounds nl  MS:  warm without deformities, calf tenderness, cyanosis or clubbing  SKIN: warm and dry without lesions    NEURO:  alert, approp, no deficits noted.        CXR:    01/22/2015  Stable chest with evidence of prior granulomatous disease and mild hyperinflation. No acute findings.  Tammy Parrett NP-C  [facility]  08/20/2015     Assessment & Plan:

## 2015-08-20 NOTE — Patient Instructions (Signed)
Follow med calendar closely and bring to each visit.  Follow up with Dr. Sherene SiresWert  In 4 months and As needed

## 2015-08-23 LAB — HM DIABETES EYE EXAM

## 2015-08-27 ENCOUNTER — Encounter: Payer: Self-pay | Admitting: Family

## 2015-08-30 ENCOUNTER — Encounter: Payer: Self-pay | Admitting: Podiatry

## 2015-08-30 ENCOUNTER — Ambulatory Visit (INDEPENDENT_AMBULATORY_CARE_PROVIDER_SITE_OTHER): Payer: Medicare Other | Admitting: Podiatry

## 2015-08-30 DIAGNOSIS — B351 Tinea unguium: Secondary | ICD-10-CM | POA: Diagnosis not present

## 2015-08-30 DIAGNOSIS — M2040 Other hammer toe(s) (acquired), unspecified foot: Secondary | ICD-10-CM

## 2015-08-30 DIAGNOSIS — M2010 Hallux valgus (acquired), unspecified foot: Secondary | ICD-10-CM

## 2015-08-30 DIAGNOSIS — M79676 Pain in unspecified toe(s): Secondary | ICD-10-CM

## 2015-08-30 DIAGNOSIS — E119 Type 2 diabetes mellitus without complications: Secondary | ICD-10-CM | POA: Diagnosis not present

## 2015-08-30 NOTE — Progress Notes (Signed)
Patient ID: Benjamin Mccall, male   DOB: 12/06/1929, 82 y.o.   MRN: [id number] Complaint:  Visit Type: Patient returns to my office for continued preventative foot care services. Complaint: Patient states" my nails have grown long and thick and become painful to walk and wear shoes" Patient has been diagnosed with DM with no complications. He presents for preventative foot care services. No changes to ROS  Podiatric Exam: Vascular: dorsalis pedis and posterior tibial pulses are palpable bilateral. Capillary return is immediate. Temperature gradient is WNL. Skin turgor WNL  Sensorium: Normal Semmes Weinstein monofilament test. Normal tactile sensation bilaterally. Nail Exam: Pt has thick disfigured discolored nails with subungual debris noted bilateral entire nail hallux through fifth toenails Ulcer Exam: There is no evidence of ulcer or pre-ulcerative changes or infection. Orthopedic Exam: Muscle tone and strength are WNL. No limitations in general ROM. No crepitus or effusions noted. Foot type and digits show no abnormalities. Bony prominences are unremarkable. Skin: No Porokeratosis. No infection or ulcers  Diagnosis:  Tinea unguium, Pain in right toe, pain in left toes  Treatment & Plan Procedures and Treatment: Consent by patient was obtained for treatment procedures. The patient understood the discussion of treatment and procedures well. All questions were answered thoroughly reviewed. Debridement of mycotic and hypertrophic toenails, 1 through 5 bilateral and clearing of subungual debris. No ulceration, no infection noted. Initiate diabetic footgear for diabetic neuropathy, bunion B/L and hammer toes B/L. Return Visit-Office Procedure: Patient instructed to return to the office for a follow up visit 3 months for continued evaluation and treatment.   Subhan Hoopes DPM 

## 2015-09-05 ENCOUNTER — Encounter: Payer: Self-pay | Admitting: Family

## 2015-10-24 ENCOUNTER — Telehealth: Payer: Self-pay | Admitting: Internal Medicine

## 2015-10-24 NOTE — Telephone Encounter (Signed)
Called spoke with tech at [facility]. She states that symbicort has filled and nothing further is needed.

## 2015-10-30 ENCOUNTER — Encounter: Payer: Self-pay | Admitting: Cardiovascular Disease

## 2015-11-25 ENCOUNTER — Ambulatory Visit: Payer: Medicare Other | Admitting: Cardiovascular Disease

## 2015-12-03 NOTE — Progress Notes (Signed)
Patient ID: Benjamin Mccall, male   DOB: 10-14-1929, 82 y.o.   MRN: [id number]     82 y.o.  diabetic referred for cardiovascular f/u History of HTN and chronic sarcoid seen by Dr Johnnette LitterWert Saw Dr Filbert SchilderHobart in [place].  Distant history of CAD with stents more then 10 years ago Had angina at that time and none recently.  ? PAF but in SR now and not on anticoagulation.  He still drives and gets around well outside of orthopedic issues. Mild exertional dyspnea no chest pain palpitations , syncope or TIA's.  Compliant with meds Lives at [facility] with wife of 50 years although she has recently been at [facility] for rehab from stroke.  Son with him today and seems invested in his care.  Mild chronic LE edema on diuretic   Seen recently by pulmonary for dyspnea and had steroid taper.  CXR unrevealing and reviewed   Echo 04/25/14 reviewed  Study Conclusions  - Left ventricle: Wall thickness was increased in a pattern of mild LVH. There was mild focal basal hypertrophy of the septum. Systolic function was normal. The estimated ejection fraction was in the range of 55% to 60%. Wall motion was normal; there were no regional wall motion abnormalities. Doppler parameters are consistent with abnormal left ventricular relaxation (grade 1 diastolic dysfunction). - Left atrium: The atrium was mildly dilated.  Has dog Rusty a Yorkie that he walks daily no chest pain Still enjoying [place] [employer] and usually talks about service   ROS: Denies fever, malais, weight loss, blurry vision, decreased visual acuity, cough, sputum, SOB, hemoptysis, pleuritic pain, palpitaitons, heartburn, abdominal pain, melena, lower extremity edema, claudication, or rash.  All other systems reviewed and negative   General: Affect appropriate Frail elderly male  HEENT: normal Neck supple with no adenopathy JVP normal no bruits no thyromegaly Lungs clear with no wheezing and good diaphragmatic motion Heart:   S1/S2 systolic ejection  murmur,rub, gallop or click PMI normal Abdomen: benighn, BS positve, no tenderness, no AAA no bruit.  No HSM or HJR small umbilical hernia  Distal pulses intact with no bruits Plus one bilateral edema with varicosities  Neuro non-focal Skin warm and dry No muscular weakness Marked LE varicosities   Medications Current Outpatient Prescriptions  Medication Sig Dispense Refill  . albuterol (PROVENTIL HFA;VENTOLIN HFA) 108 (90 BASE) MCG/ACT inhaler Inhale 2 puffs into the lungs every 4 (four) hours as needed for wheezing or shortness of breath.     . ALPRAZolam (XANAX) 0.25 MG tablet Take 0.25 mg by mouth at bedtime as needed for anxiety.    [name]. aspirin 81 MG tablet Take 81 mg by mouth every morning.     [name]. atorvastatin (LIPITOR) 20 MG tablet Take 20 mg by mouth daily at 6 PM.     . budesonide-formoterol (SYMBICORT) 160-4.5 MCG/ACT inhaler Inhale 2 puffs into the lungs 2 (two) times daily. 1 Inhaler 11  . Cholecalciferol (VITAMIN D3) 1000 UNITS CAPS Take 1,000 Units by mouth every morning.     . citalopram (CELEXA) 10 MG tablet Take 10 mg by mouth every morning.     . clopidogrel (PLAVIX) 75 MG tablet Take 75 mg by mouth every morning.     [name]. CREON 12000 units CPEP capsule Take 12,000 mg by mouth 3 (three) times daily.    [name]. diltiazem (CARDIZEM CD) 240 MG 24 hr capsule Take 240 mg by mouth every morning.     . ezetimibe (ZETIA) 10 MG tablet Take 1 tablet (  10 mg total) by mouth daily. (Patient taking differently: Take 10 mg by mouth every morning. ) 30 tablet 0  . famotidine (PEPCID) 20 MG tablet One at bedtime (Patient taking differently: Take 20 mg by mouth at bedtime. One at bedtime)    . fluticasone (FLONASE) 50 MCG/ACT nasal spray Place 2 sprays into both nostrils as needed (nasal stuffiness).     . gabapentin (NEURONTIN) 300 MG capsule Take 300 mg by mouth at bedtime.    [name] guaiFENesin (MUCINEX) 600 MG 12 hr tablet Take 600 mg by mouth 2 (two) times daily as needed for  cough.     . hydrochlorothiazide (HYDRODIURIL) 25 MG tablet Take 25 mg by mouth every morning.     . hydrocortisone cream 1 % Apply 1 application topically daily as needed for itching.    [name] ibuprofen (ADVIL,MOTRIN) 200 MG tablet Take as needed for joint pain    . Iron-Vitamins (THEREMS H PO) Take 1 tablet by mouth daily.    [name] levothyroxine (SYNTHROID, LEVOTHROID) 100 MCG tablet Take 100 mcg by mouth every morning.     . loratadine (CLARITIN) 10 MG tablet Take 10 mg by mouth daily as needed (drainage, drippy nose).     . metFORMIN (GLUCOPHAGE) 500 MG tablet Take 500 mg by mouth 2 (two) times daily with a meal.     . nitroGLYCERIN (NITRODUR - DOSED IN MG/24 HR) 0.6 mg/hr patch Apply patch every morning and remove at bedtime    . nitroGLYCERIN (NITROSTAT) 0.4 MG SL tablet Place 0.4 mg under the tongue every 5 (five) minutes as needed for chest pain.     [name] omeprazole (PRILOSEC) 20 MG capsule Take 20 mg by mouth daily before breakfast.     . polyethylene glycol powder (GLYCOLAX/MIRALAX) powder Take 1 Container by mouth daily.     . potassium chloride (MICRO-K) 10 MEQ CR capsule Take 10 mEq by mouth 2 (two) times daily.    . simethicone (GAS-X EXTRA STRENGTH) 125 MG chewable tablet Chew 1-2 tablets (125-250 mg total) by mouth 3 (three) times daily with meals. Max 4 tablets daily (Patient taking differently: Chew 125-250 mg by mouth 4 (four) times daily as needed (gas). Max 4 tablets daily) 30 tablet 0   No current facility-administered medications for this visit.     Allergies Reglan [metoclopramide] and Sulfa antibiotics  Family History: Family History  Problem Relation Age of Onset  . Hypertension Mother   . Stroke Mother   . Ovarian cancer Mother   . Stroke Father   . Diabetes Son     Social History: Social History   Social History  . Marital status: Married    Spouse name: N/A  . Number of children: 2  . Years of education: 12   Occupational History  . 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 Smoking status: Former Smoker    Packs/day: 0.50    Years: 7.00    Types: Pipe, Cigars    Quit date: 04/27/1968  . Smokeless tobacco: Never Used  . Alcohol use No  . Drug use: No  . Sexual activity: Not on file   Other Topics Concern  . Not on file   Social History Narrative   Born and raised in [place]. Residing in [place] Living with his wife. Denies any beliefs effecting healthcare.     Past Surgical History:  Procedure Laterality Date  .  APPENDECTOMY    . CATARACT EXTRACTION    . HIATAL HERNIA REPAIR    . PERCUTANEOUS CORONARY STENT INTERVENTION (PCI-S)      Past Medical History:  Diagnosis Date  . Asthma   . Atrial fibrillation (HCC)   . Chest pain   . Diabetes mellitus without complication (HCC)   . GERD (gastroesophageal reflux disease)   . Hernia, hiatal   . Hyperlipidemia   . Hypertension   . Hypothyroid     Electrocardiogram:  09/07/14   SR rate 51  LAD PR 230  LVH  12/04/15 SR rate 57 PR 220 LAFB LVH   Assessment and Plan CAD" Stable with no angina and good activity level.  Continue medical Rx  PAF: maint NSR with no palpitations   HTN:  Well controlled.  Continue current medications and low sodium Dash type diet.    Sarcoid:  Mild wheezing has inhaler f/u primary /pulmonary  Dyspnea  Related to asthma / sarcoid EF normal no edema   First Degree Block :  F/u ECG in a year no high grade AV block  Umbilical Hernia: stable small non tender and reducable  Veins: varicose encouraged compression stockings no incidence of phlebitis   Benjamin Mccall

## 2015-12-04 ENCOUNTER — Encounter (INDEPENDENT_AMBULATORY_CARE_PROVIDER_SITE_OTHER): Payer: Self-pay

## 2015-12-04 ENCOUNTER — Encounter: Payer: Self-pay | Admitting: Cardiovascular Disease

## 2015-12-04 ENCOUNTER — Ambulatory Visit (INDEPENDENT_AMBULATORY_CARE_PROVIDER_SITE_OTHER): Payer: Medicare Other | Admitting: Cardiovascular Disease

## 2015-12-04 VITALS — BP 140/77 | HR 59 | Ht 68.0 in | Wt 168.8 lb

## 2015-12-04 DIAGNOSIS — I2510 Atherosclerotic heart disease of native coronary artery without angina pectoris: Secondary | ICD-10-CM | POA: Diagnosis not present

## 2015-12-04 DIAGNOSIS — I2583 Coronary atherosclerosis due to lipid rich plaque: Principal | ICD-10-CM

## 2015-12-04 NOTE — Patient Instructions (Addendum)

## 2015-12-06 ENCOUNTER — Ambulatory Visit: Payer: Medicare Other | Admitting: Podiatry

## 2015-12-13 ENCOUNTER — Ambulatory Visit: Payer: Medicare Other | Admitting: Podiatry

## 2015-12-20 ENCOUNTER — Ambulatory Visit (INDEPENDENT_AMBULATORY_CARE_PROVIDER_SITE_OTHER): Payer: Medicare Other | Admitting: Internal Medicine

## 2015-12-20 ENCOUNTER — Encounter (INDEPENDENT_AMBULATORY_CARE_PROVIDER_SITE_OTHER): Payer: Self-pay

## 2015-12-20 ENCOUNTER — Encounter: Payer: Self-pay | Admitting: Internal Medicine

## 2015-12-20 VITALS — BP 140/62 | HR 55 | Ht 68.0 in | Wt 168.0 lb

## 2015-12-20 DIAGNOSIS — I2510 Atherosclerotic heart disease of native coronary artery without angina pectoris: Secondary | ICD-10-CM | POA: Diagnosis not present

## 2015-12-20 DIAGNOSIS — J449 Chronic obstructive pulmonary disease, unspecified: Secondary | ICD-10-CM

## 2015-12-20 MED ORDER — BUDESONIDE-FORMOTEROL FUMARATE 160-4.5 MCG/ACT IN AERO: 2.0000 | INHALATION_SPRAY | Freq: Two times a day (BID) | RESPIRATORY_TRACT | 11 refills | Status: DC

## 2015-12-20 NOTE — Progress Notes (Signed)
Subjective:    Patient ID: Benjamin Mccall, male    DOB: [birth year]    MRN: [id number]    Brief patient profile:  82yowm quit smoking at dx of sarcoid at age 82 dx in [place] by surgeon with neck node with cough/sob rx prednisone x at least a decade with chronic doe ever since then x yardwork and leveled off d/c prednisone around age 82 referred to pulmonary 03/27/2014  by [facility] primary care with doe s  Cough  With GOLD II copd/ symptoms improved off ACEi    History of Present Illness  03/27/2014 1st [facility] office visit/ Wert   Chief Complaint  Patient presents with  . Pulmonary Consult    Referred by Marcos Eke, NP. Pt states had lung bx age 23 and was dxed with Sarcoid. He recently moved here to [place] from [facility]. He c/o SOB that comes and goes- sometimes gets out of breath when walking the dog and exposed to cold weather.   doe is chronic and more related to how heavy he exerts   Maybe better with symbicort ? Makes him hoarse  Has combivent but doesn't think it helps/ doesn't use it rec Try symbicort 160 one twice daily to see if you don't do just as well     04/23/2015  f/u ov/Wert re: copd II/ on acei on symbicort 160 one bid  Chief Complaint  Patient presents with  . Follow-up    Pt states his breathing is overall doing well. He has good days and bad. He is using albuterol once per wk on average.   has 2 patterns of doe > one reproducible with walking too fast on his 3 x daily walk with dogs/ esp if > nl  Pace or up hills = MMRC1 = can walk nl pace, flat grade, can't hurry or go uphills or steps s sob   The other is unpredictable and not related to walking / can occur at rest/ assoc with dry cough and hoarseness  rec Stop lisinopril and I will let Dr Carver Fila know why it may be working against you Keep up your regular walking schedule so you can let me know if you are losing any ground between visits   05/20/15 NP ov rec Change Symbicort to Budesonide and Brovana  Neb Twice daily  > did not feel it worked as well      12/20/2015  f/u ov/Wert re: GOLD II copd/ maint rx symb 160 2bid very rare saba  Chief Complaint  Patient presents with  . Follow-up    Breathing overall doing well. He states he has only used rescue inhaler x 3 since the last visit.   as long as it is cool can make it up the hill at [place] and elm s stopping using cane and no 02 / does fine on flat surfaces at [facility] No noct symtpoms/ throat ok     No obvious day to day or daytime variability or assoc  cp or chest tightness, subjective wheeze or overt   hb symptoms. No unusual exp hx or h/o childhood pna/ asthma or knowledge of premature birth.  Sleeping ok without nocturnal  or early am exacerbation  of respiratory  c/o's or need for noct saba. Also denies any obvious fluctuation of symptoms with weather or environmental changes or other aggravating or alleviating factors except as outlined above   Current Medications, Allergies, Complete Past Medical History, Past Surgical History, Family History, and Social History were  reviewed in [facility] electronic medical record.  ROS  The following are not active complaints unless bolded sore throat, dysphagia, dental problems, itching, sneezing,  nasal congestion or excess/ purulent secretions, ear ache,   fever, chills, sweats, unintended wt loss, classically pleuritic or exertional cp, hemoptysis,  orthopnea pnd or leg swelling, presyncope, palpitations, abdominal pain, anorexia, nausea, vomiting, diarrhea  or change in bowel or bladder habits, change in stools or urine, dysuria,hematuria,  rash, arthralgias, visual complaints, headache, numbness, weakness or ataxia or problems with walking or coordination,  change in mood/affect or memory.                Objective:   Physical Exam  amb   wm  nad walks with cane    01/22/2015        174  > 04/23/2015  171 > 07/22/2015 169 > 12/20/2015  168    12/25/14 174 lb (78.926 kg)    11/12/14 168 lb 1.9 oz (76.259 kg)  07/26/14 172 lb (78.019 kg)    Vital signs reviewed  - note sats 95% on RA on Arrival   HEENT: nl dentition, turbinates, and orophanx. Nl external ear canals without cough reflex   NECK :  without JVD/Nodes/TM/ nl carotid upstrokes bilaterally   LUNGS: no acc muscle use, clear to A and P bilaterally without cough on insp or exp maneuvers   CV:  RRR  no s3 or murmur or increase in P2, no edema   ABD:  soft and nontender with nl excursion in the supine position. No bruits or organomegaly, bowel sounds nl  MS:  warm without deformities, calf tenderness, cyanosis or clubbing  SKIN: warm and dry without lesions    NEURO:  alert, approp, no deficits noted.       I personally reviewed images and agree with radiology impression as follows:  CXR:    01/22/2015  Stable chest with evidence of prior granulomatous disease and mild hyperinflation. No acute findings.    Assessment & Plan:

## 2015-12-20 NOTE — Patient Instructions (Signed)
No change in medications   Only use your albuterol as a rescue medication to be used if you can't catch your breath by resting or doing a relaxed purse lip breathing pattern.  - The less you use it, the better it will work when you need it. - Ok to use up to 2 puffs  every 4 hours if you must but call for immediate appointment if use goes up over your usual need - Don't leave home without it !!  (think of it like the spare tire for your car)    If you are satisfied with your treatment plan,  let your doctor know and he/she can either refill your medications or you can return here when your prescription runs out.     If in any way you are not 100% satisfied,  please tell us.  If 100% better, tell your friends!  Pulmonary follow up is as needed

## 2015-12-20 NOTE — Assessment & Plan Note (Signed)
pfts 06/29/14  FEV1   1.70 (72%) ratio 66 and DLCO 65 corrects to 99  - 12/25/2014 p extensive coaching HFA effectiveness =    90%   - 01/22/2015  Walked RA x 3 laps @ 185 ft each stopped due to  End of study, nl pace with cane , no sob or desat   - final f/u on 12/20/2015 doing fine on symbicort 160 2bid > pulmonary f/u is prn   Now that off acei and on maint rx with symb 160 2bid not really limited by sob and no tendency to aecopd so pulmonary f/u can be prn  Each maintenance medication was reviewed in detail including most importantly the difference between maintenance and as needed and under what circumstances the prns are to be used.  Please see instructions for details which were reviewed in writing and the patient given a copy.

## 2016-01-23 ENCOUNTER — Other Ambulatory Visit (INDEPENDENT_AMBULATORY_CARE_PROVIDER_SITE_OTHER): Payer: Medicare Other

## 2016-01-23 ENCOUNTER — Ambulatory Visit (INDEPENDENT_AMBULATORY_CARE_PROVIDER_SITE_OTHER): Payer: Medicare Other | Admitting: Family

## 2016-01-23 ENCOUNTER — Other Ambulatory Visit: Payer: Self-pay | Admitting: Emergency Medicine

## 2016-01-23 ENCOUNTER — Encounter: Payer: Self-pay | Admitting: Family

## 2016-01-23 ENCOUNTER — Other Ambulatory Visit: Payer: Self-pay | Admitting: Family

## 2016-01-23 VITALS — BP 162/68 | HR 51 | Temp 98.0°F | Resp 16 | Ht 68.0 in | Wt 173.0 lb

## 2016-01-23 DIAGNOSIS — I2510 Atherosclerotic heart disease of native coronary artery without angina pectoris: Secondary | ICD-10-CM | POA: Diagnosis not present

## 2016-01-23 DIAGNOSIS — E119 Type 2 diabetes mellitus without complications: Secondary | ICD-10-CM

## 2016-01-23 DIAGNOSIS — E118 Type 2 diabetes mellitus with unspecified complications: Secondary | ICD-10-CM | POA: Diagnosis not present

## 2016-01-23 DIAGNOSIS — Z23 Encounter for immunization: Secondary | ICD-10-CM | POA: Diagnosis not present

## 2016-01-23 DIAGNOSIS — Z5181 Encounter for therapeutic drug level monitoring: Secondary | ICD-10-CM

## 2016-01-23 LAB — MICROALBUMIN / CREATININE URINE RATIO
CREATININE, U: 84.9 mg/dL
MICROALB/CREAT RATIO: 0.8 mg/g (ref 0.0–30.0)
Microalb, Ur: <0.7 mg/dL (ref 0.0–1.9)

## 2016-01-23 LAB — BASIC METABOLIC PANEL
BUN: 30 mg/dL — ABNORMAL HIGH (ref 6–23)
CHLORIDE: 103 meq/L (ref 96–112)
CO2: 29 meq/L (ref 19–32)
Calcium: 8.9 mg/dL (ref 8.4–10.5)
Creatinine, Ser: 1.37 mg/dL (ref 0.40–1.50)
GFR: 52.65 mL/min — ABNORMAL LOW (ref >60.00)
Glucose, Bld: 74 mg/dL (ref 70–99)
POTASSIUM: 4 meq/L (ref 3.5–5.1)
SODIUM: 140 meq/L (ref 135–145)

## 2016-01-23 LAB — HEMOGLOBIN A1C: Hgb A1c MFr Bld: 6.3 % (ref 4.6–6.5)

## 2016-01-23 NOTE — Patient Instructions (Signed)
Thank you for choosing [facility].  SUMMARY AND INSTRUCTIONS:  Medication:  Please continue to take medications as prescribed.   Your prescription(s) have been submitted to your pharmacy or been printed and provided for you. Please take as directed and contact our office if you believe you are having problem(s) with the medication(s) or have any questions.  Labs:  Please stop by the lab on the lower level of the building for your blood work. Your results will be released to MyChart (or called to you) after review, usually within 72 hours after test completion. If any changes need to be made, you will be notified at that same time.  1.) The lab is open from 7:30am to 5:30 pm Monday-Friday 2.) No appointment is necessary 3.) Fasting (if needed) is 6-8 hours after food and drink; black coffee and water are okay   Follow up:  If your symptoms worsen or fail to improve, please contact our office for further instruction, or in case of emergency go directly to the emergency room at the closest medical facility.

## 2016-01-23 NOTE — Assessment & Plan Note (Signed)
Type 2 diabetes is well controlled with the current regimen and no adverse side effects or new symptoms of end organ damage. Obtain A1c, BMET, and urine microalbumin. Maintained on atorvastatin for CAD risk reduction. Diabetic foot exam completed today. Diabetic eye exam is up to date. Pneumonia vaccinations are up to date. Change blood sugar checks to Monday, Wednesday, and Friday. Continue current dosage of metformin.

## 2016-01-23 NOTE — Progress Notes (Signed)
Subjective:    Patient ID: Benjamin Passeyonald R Aziz, male    DOB: January 22, 1930, 82 y.o.   MRN: [id number]  Chief Complaint  Patient presents with  . Follow-up    diabetes    HPI:  Benjamin Mccall is a 82 y.o. male who  has a past medical history of Asthma; Atrial fibrillation (HCC); Chest pain; Diabetes mellitus without complication (HCC); GERD (gastroesophageal reflux disease); Hernia, hiatal; Hyperlipidemia; Hypertension; and Hypothyroid. and presents today for an office follow iup.   1.) Type 2 diabetes -  Currently maintained on metformin. Reports taking the medication as prescribed and denies adverse side effects. Blood sugars have remained well controlled. Denies any new symptoms of end organ damage.                                                                                                                     Lab Results  Component Value Date   HGBA1C 6.3 01/23/2016     Allergies  Allergen Reactions  . Reglan [Metoclopramide]     Tremors   . Sulfa Antibiotics Other (See Comments)    shakes      Outpatient Medications Prior to Visit  Medication Sig Dispense Refill  . albuterol (PROVENTIL HFA;VENTOLIN HFA) 108 (90 BASE) MCG/ACT inhaler Inhale 2 puffs into the lungs every 4 (four) hours as needed for wheezing or shortness of breath.     . ALPRAZolam (XANAX) 0.25 MG tablet Take 0.25 mg by mouth at bedtime as needed for anxiety.    [name]. aspirin 81 MG tablet Take 81 mg by mouth every morning.     [name]. atorvastatin (LIPITOR) 20 MG tablet Take 20 mg by mouth daily at 6 PM.     . budesonide-formoterol (SYMBICORT) 160-4.5 MCG/ACT inhaler Inhale 2 puffs into the lungs 2 (two) times daily. 1 Inhaler 11  . Cholecalciferol (VITAMIN D3) 1000 UNITS CAPS Take 1,000 Units by mouth every morning.     . citalopram (CELEXA) 10 MG tablet Take 10 mg by mouth every morning.     . clopidogrel (PLAVIX) 75 MG tablet Take 75 mg by mouth every morning.     [name]. CREON 12000 units CPEP capsule Take 12,000 mg  by mouth 3 (three) times daily.    [name]. diltiazem (CARDIZEM CD) 240 MG 24 hr capsule Take 240 mg by mouth every morning.     . ezetimibe (ZETIA) 10 MG tablet Take 1 tablet (10 mg total) by mouth daily. (Patient taking differently: Take 10 mg by mouth every morning. ) 30 tablet 0  . famotidine (PEPCID) 20 MG tablet One at bedtime (Patient taking differently: Take 20 mg by mouth at bedtime. One at bedtime)    . fluticasone (FLONASE) 50 MCG/ACT nasal spray Place 2 sprays into both nostrils as needed (nasal stuffiness).     . gabapentin (NEURONTIN) 300 MG capsule Take 300 mg by mouth at bedtime.    [name]. guaiFENesin (MUCINEX) 600 MG 12 hr tablet Take 600 mg by mouth 2 (two)  times daily as needed for cough.     . hydrochlorothiazide (HYDRODIURIL) 25 MG tablet Take 25 mg by mouth every morning.     . hydrocortisone cream 1 % Apply 1 application topically daily as needed for itching.    [name] ibuprofen (ADVIL,MOTRIN) 200 MG tablet Take as needed for joint pain    . Iron-Vitamins (THEREMS H PO) Take 1 tablet by mouth daily.    [name] levothyroxine (SYNTHROID, LEVOTHROID) 100 MCG tablet Take 100 mcg by mouth every morning.     . loratadine (CLARITIN) 10 MG tablet Take 10 mg by mouth daily as needed (drainage, drippy nose).     . metFORMIN (GLUCOPHAGE) 500 MG tablet Take 500 mg by mouth 2 (two) times daily with a meal.     . nitroGLYCERIN (NITRODUR - DOSED IN MG/24 HR) 0.6 mg/hr patch Apply patch every morning and remove at bedtime    . nitroGLYCERIN (NITROSTAT) 0.4 MG SL tablet Place 0.4 mg under the tongue every 5 (five) minutes as needed for chest pain.     [name] omeprazole (PRILOSEC) 20 MG capsule Take 20 mg by mouth daily before breakfast.     . polyethylene glycol powder (GLYCOLAX/MIRALAX) powder Take 1 Container by mouth daily.     . potassium chloride (MICRO-K) 10 MEQ CR capsule Take 10 mEq by mouth 2 (two) times daily.    . simethicone (GAS-X EXTRA STRENGTH) 125 MG chewable tablet Chew 1-2 tablets (125-250 mg total)  by mouth 3 (three) times daily with meals. Max 4 tablets daily (Patient taking differently: Chew 125-250 mg by mouth 4 (four) times daily as needed (gas). Max 4 tablets daily) 30 tablet 0   No facility-administered medications prior to visit.     Review of Systems  Eyes:       Negative for changes in vision.  Respiratory: Negative for chest tightness and shortness of breath.   Cardiovascular: Negative for chest pain, palpitations and leg swelling.  Endocrine: Negative for polydipsia, polyphagia and polyuria.  Neurological: Negative for dizziness, weakness, light-headedness and headaches.      Objective:    BP (!) 162/68 (BP Location: Left Arm, Patient Position: Sitting, Cuff Size: Normal)   Pulse (!) 51   Temp 98 F (36.7 C) (Oral)   Resp 16   Ht 5\' 8"  (1.727 m)   Wt 173 lb (78.5 kg)   SpO2 96%   BMI 26.30 kg/m  Nursing note and vital signs reviewed.  Physical Exam  Constitutional: He is oriented to person, place, and time. He appears well-developed and well-nourished. No distress.  Cardiovascular: Normal rate, regular rhythm, normal heart sounds and intact distal pulses.   Pulmonary/Chest: Effort normal and breath sounds normal.  Neurological: He is alert and oriented to person, place, and time.  Diabetic Foot Exam - Simple   Simple Foot Form Diabetic Foot exam was performed with the following findings:  Yes  01/23/2016  9:49 PM  Visual Inspection No deformities, no ulcerations, no other skin breakdown bilaterally:  Yes Sensation Testing Intact to touch and monofilament testing bilaterally:  Yes Pulse Check Posterior Tibialis and Dorsalis pulse intact bilaterally:  Yes Comments    Skin: Skin is warm and dry.  Psychiatric: He has a normal mood and affect. His behavior is normal. Judgment and thought content normal.       Assessment & Plan:   Problem List Items Addressed This Visit      Endocrine   Controlled type 2 diabetes mellitus (HCC) - Primary  Type 2  diabetes is well controlled with the current regimen and no adverse side effects or new symptoms of end organ damage. Obtain A1c, BMET, and urine microalbumin. Maintained on atorvastatin for CAD risk reduction. Diabetic foot exam completed today. Diabetic eye exam is up to date. Pneumonia vaccinations are up to date. Change blood sugar checks to Monday, Wednesday, and Friday. Continue current dosage of metformin.       Relevant Orders   Hemoglobin A1c (Completed)   Urine Microalbumin w/creat. ratio (Completed)    Other Visit Diagnoses    Encounter for immunization       Relevant Orders   Flu vaccine HIGH DOSE PF (Completed)      I am having Benjamin Mccall maintain his aspirin, atorvastatin, citalopram, clopidogrel, diltiazem, polyethylene glycol powder, hydrochlorothiazide, levothyroxine, metFORMIN, nitroGLYCERIN, potassium chloride, Vitamin D3, guaiFENesin, nitroGLYCERIN, omeprazole, ibuprofen, gabapentin, albuterol, fluticasone, loratadine, famotidine, ALPRAZolam, simethicone, ezetimibe, CREON, Iron-Vitamins (THEREMS H PO), hydrocortisone cream, and budesonide-formoterol.  Follow-up: Return in about 6 months (around 07/22/2016), or if symptoms worsen or fail to improve.  Jeanine Luz, FNP

## 2016-01-24 ENCOUNTER — Encounter: Payer: Self-pay | Admitting: Emergency Medicine

## 2016-01-27 ENCOUNTER — Telehealth: Payer: Self-pay | Admitting: Family

## 2016-01-27 NOTE — Telephone Encounter (Signed)
Documents have been faxed back over letting facility know to stop potassium for now.

## 2016-01-27 NOTE — Telephone Encounter (Signed)
Patient states he got a call Friday to stop potasium.  Patient needs [facility] [phone]

## 2016-02-25 ENCOUNTER — Encounter: Payer: Self-pay | Admitting: Podiatry

## 2016-02-25 ENCOUNTER — Ambulatory Visit (INDEPENDENT_AMBULATORY_CARE_PROVIDER_SITE_OTHER): Payer: Medicare Other | Admitting: Podiatry

## 2016-02-25 VITALS — Ht 68.0 in | Wt 173.0 lb

## 2016-02-25 DIAGNOSIS — E119 Type 2 diabetes mellitus without complications: Secondary | ICD-10-CM | POA: Diagnosis not present

## 2016-02-25 DIAGNOSIS — M79676 Pain in unspecified toe(s): Secondary | ICD-10-CM | POA: Diagnosis not present

## 2016-02-25 DIAGNOSIS — B351 Tinea unguium: Secondary | ICD-10-CM

## 2016-02-25 NOTE — Progress Notes (Signed)
Patient ID: Benjamin Mccall, male   DOB: 09/20/1929, 82 y.o.   MRN: [id number] Complaint:  Visit Type: Patient returns to my office for continued preventative foot care services. Complaint: Patient states" my nails have grown long and thick and become painful to walk and wear shoes" Patient has been diagnosed with DM with no complications. He presents for preventative foot care services. No changes to ROS  Podiatric Exam: Vascular: dorsalis pedis and posterior tibial pulses are palpable bilateral. Capillary return is immediate. Temperature gradient is WNL. Skin turgor WNL  Sensorium: Normal Semmes Weinstein monofilament test. Normal tactile sensation bilaterally. Nail Exam: Pt has thick disfigured discolored nails with subungual debris noted bilateral entire nail hallux through fifth toenails Ulcer Exam: There is no evidence of ulcer or pre-ulcerative changes or infection. Orthopedic Exam: Muscle tone and strength are WNL. No limitations in general ROM. No crepitus or effusions noted. Foot type and digits show no abnormalities. Bony prominences are unremarkable. Skin: No Porokeratosis. No infection or ulcers  Diagnosis:  Tinea unguium, Pain in right toe, pain in left toes  Treatment & Plan Procedures and Treatment: Consent by patient was obtained for treatment procedures. The patient understood the discussion of treatment and procedures well. All questions were answered thoroughly reviewed. Debridement of mycotic and hypertrophic toenails, 1 through 5 bilateral and clearing of subungual debris. No ulceration, no infection noted. Initiate diabetic footgear for diabetic neuropathy, bunion B/L and hammer toes B/L. Return Visit-Office Procedure: Patient instructed to return to the office for a follow up visit 3 months for continued evaluation and treatment.   Benjamin Mccall DPM

## 2016-02-26 ENCOUNTER — Telehealth: Payer: Self-pay | Admitting: *Deleted

## 2016-02-26 NOTE — Telephone Encounter (Signed)
Left msg on triage stating faxed over clarification form wanting to know when patient should resume his Potassium. Rec'd order back on 10/2 to hold pt potassium for 30 days today makes 30 days. Need form faxed back ASAP...Raechel Chute/lmb

## 2016-02-27 ENCOUNTER — Telehealth: Payer: Self-pay | Admitting: Family

## 2016-02-27 NOTE — Telephone Encounter (Signed)
Pt called in and said that he was told to stop the potassium for 30 days.  The 30 days are up, he needs nurse to call [facility] and tell them weather he needs to restart it or not?    [phone]

## 2016-02-27 NOTE — Telephone Encounter (Signed)
Completed via fax.

## 2016-03-23 ENCOUNTER — Ambulatory Visit: Payer: Medicare Other | Admitting: Family

## 2016-03-24 ENCOUNTER — Other Ambulatory Visit (INDEPENDENT_AMBULATORY_CARE_PROVIDER_SITE_OTHER): Payer: Medicare Other

## 2016-03-24 ENCOUNTER — Ambulatory Visit (INDEPENDENT_AMBULATORY_CARE_PROVIDER_SITE_OTHER): Payer: Medicare Other | Admitting: Family

## 2016-03-24 ENCOUNTER — Encounter: Payer: Self-pay | Admitting: Family

## 2016-03-24 VITALS — BP 160/60 | HR 48 | Temp 97.8°F | Resp 14 | Ht 68.0 in | Wt 170.0 lb

## 2016-03-24 DIAGNOSIS — I10 Essential (primary) hypertension: Secondary | ICD-10-CM

## 2016-03-24 DIAGNOSIS — Z0000 Encounter for general adult medical examination without abnormal findings: Secondary | ICD-10-CM | POA: Insufficient documentation

## 2016-03-24 DIAGNOSIS — I480 Paroxysmal atrial fibrillation: Secondary | ICD-10-CM | POA: Diagnosis not present

## 2016-03-24 DIAGNOSIS — J449 Chronic obstructive pulmonary disease, unspecified: Secondary | ICD-10-CM | POA: Diagnosis not present

## 2016-03-24 DIAGNOSIS — E782 Mixed hyperlipidemia: Secondary | ICD-10-CM

## 2016-03-24 DIAGNOSIS — L989 Disorder of the skin and subcutaneous tissue, unspecified: Secondary | ICD-10-CM

## 2016-03-24 DIAGNOSIS — E119 Type 2 diabetes mellitus without complications: Secondary | ICD-10-CM | POA: Diagnosis not present

## 2016-03-24 DIAGNOSIS — Z125 Encounter for screening for malignant neoplasm of prostate: Secondary | ICD-10-CM

## 2016-03-24 LAB — COMPREHENSIVE METABOLIC PANEL
ALBUMIN: 4 g/dL (ref 3.5–5.2)
ALT: 15 U/L (ref 0–53)
AST: 23 U/L (ref 0–37)
Alkaline Phosphatase: 56 U/L (ref 39–117)
BILIRUBIN TOTAL: 0.8 mg/dL (ref 0.2–1.2)
BUN: 28 mg/dL — AB (ref 6–23)
CALCIUM: 9.8 mg/dL (ref 8.4–10.5)
CHLORIDE: 101 meq/L (ref 96–112)
CO2: 30 mEq/L (ref 19–32)
CREATININE: 1.47 mg/dL (ref 0.40–1.50)
GFR: 48.52 mL/min — ABNORMAL LOW (ref >60.00)
Glucose, Bld: 98 mg/dL (ref 70–99)
Potassium: 4.6 mEq/L (ref 3.5–5.1)
SODIUM: 140 meq/L (ref 135–145)
TOTAL PROTEIN: 6.8 g/dL (ref 6.0–8.3)

## 2016-03-24 LAB — LIPID PANEL
CHOL/HDL RATIO: 2
Cholesterol: 144 mg/dL (ref 0–200)
HDL: 57.8 mg/dL (ref >39.00)
LDL Cholesterol: 74 mg/dL (ref 0–99)
NONHDL: 85.9
Triglycerides: 58 mg/dL (ref 0.0–149.0)
VLDL: 11.6 mg/dL (ref 0.0–40.0)

## 2016-03-24 LAB — CBC
HCT: 35.6 % — ABNORMAL LOW (ref 39.0–52.0)
HEMOGLOBIN: 12 g/dL — AB (ref 13.0–17.0)
MCHC: 33.7 g/dL (ref 30.0–36.0)
MCV: 87.2 fl (ref 78.0–100.0)
Platelets: 255 10*3/uL (ref 150.0–400.0)
RBC: 4.09 Mil/uL — ABNORMAL LOW (ref 4.22–5.81)
RDW: 14.5 % (ref 11.5–15.5)
WBC: 9.5 10*3/uL (ref 4.0–10.5)

## 2016-03-24 LAB — PSA, MEDICARE: PSA: 0.03 ng/mL — AB (ref 0.10–4.00)

## 2016-03-24 NOTE — Assessment & Plan Note (Signed)
Continues to express atrial fibrillation appears stable with current regimen and anticoagulated with aspirin and Plavix. No nuisance bleeding. Continue to monitor.

## 2016-03-24 NOTE — Assessment & Plan Note (Signed)
COPD: Stage II appears adequately controlled with current medication regimen and recently advised by pulmonology to follow-up as needed and can be managed by primary care. Continue to monitor.

## 2016-03-24 NOTE — Assessment & Plan Note (Signed)
Hyperlipidemia appears stable with current medication regimen and no adverse side effects. No myalgias. Continue to monitor.

## 2016-03-24 NOTE — Assessment & Plan Note (Addendum)
Reviewed and updated patient's medical, surgical, family and social history. Medications and allergies were also reviewed. Basic screenings for depression, activities of daily living, hearing, cognition and safety were performed. Provider list was updated and health plan was provided to the patient.   1) Anticipatory Guidance: Discussed importance of wearing a seatbelt while driving and not texting while driving; changing batteries in smoke detector at least once annually; wearing suntan lotion when outside; eating a balanced and moderate diet; getting physical activity at least 30 minutes per day.  2) Immunizations / Screenings / Labs:  Declines Zostavax. All immunizations are up-to-date per recommendations. Obtain PSA for prostate cancer screening. All other screenings are up-to-date per recommendations. Obtain CBC, CMET, and lipid profile.  Overall well exam with risk factors for cardiovascular disease including coronary artery disease, hypertension, hyperlipidemia, and type 2 diabetes. Chronic conditions appear adequately controlled through medication regimens. No adverse side effects. Continues to live and [facility] without complications. Continue healthy lifestyle behaviors and choices. Follow-up prevention exam in 1 year. Follow-up office visit pending blood work and for chronic conditions.

## 2016-03-24 NOTE — Assessment & Plan Note (Signed)
Type 2 diabetes appears adequately controlled current medication regimen. Continue current dosage of metformin and continue to monitor blood sugars at home.

## 2016-03-24 NOTE — Progress Notes (Signed)
Subjective:    Patient ID: Benjamin Mccall, male    DOB: December 12, 1929, 82 y.o.   MRN: [id number]  Chief Complaint  Patient presents with  . CPE    fasting, neck mass that has been there for a couple of months that he would like checked    HPI:  Benjamin Mccall is a 82 y.o. male who presents today for a Medicare Annual Wellness/Physical exam.    1) Health Maintenance -   Diet - Averages about 3 meals per day consisting of a regular diet; Caffeine intake of about 1 cup daily - drinks primarily decaf.   Exercise - Doing lot of walking with [employer]  2) Preventative Exams / Immunizations:  Dental -- Up to date  Vision -- Up to date.    Health Maintenance  Topic Date Due  . ZOSTAVAX  04/24/1992  . HEMOGLOBIN A1C  07/22/2016  . OPHTHALMOLOGY EXAM  08/22/2016  . FOOT EXAM  01/22/2017  . URINE MICROALBUMIN  01/22/2017  . TETANUS/TDAP  01/22/2025  . INFLUENZA VACCINE  Completed  . PNA vac Low Risk Adult  Addressed     Immunization History  Administered Date(s) Administered  . Influenza Split 01/25/2014  . Influenza, High Dose Seasonal PF 01/23/2015, 01/23/2016  . Pneumococcal Conjugate-13 11/25/2013  . Tdap 01/23/2015    3.) Skin lesion - This is a new problem. Associated symptom of a skin lesion located on the left side of his neck has been going on for approximately 2 months. Described as elevated without pain or discharge. Indicates has gotten bigger over time. Denies any modifying factors that make it better or worse.   RISK FACTORS  Tobacco History  Smoking Status  . Former Smoker  . Packs/day: 0.50  . Years: 7.00  . Types: Pipe, Cigars  . Quit date: 04/27/1968  Smokeless Tobacco  . Never Used     Cardiac risk factors: advanced age (older than 60 for men, 52 for women), diabetes mellitus, dyslipidemia, hypertension and male gender.  Depression Screen  Depression screen [facility] 2/9 03/24/2016  Decreased Interest 0  Down, Depressed, Hopeless 0    PHQ - 2 Score 0     Activities of Daily Living In your present state of health, do you have any difficulty performing the following activities?:  Driving? No Managing money?  No Feeding yourself? No Getting from bed to chair? No Climbing a flight of stairs? No Preparing food and eating?: No Bathing or showering? No Getting dressed: No Getting to the toilet? No Using the toilet: No Moving around from place to place: No In the past year have you fallen or had a near fall?:No   Home Safety Has smoke detector and wears seat belts. No firearms. No excess sun exposure. Are there smokers in your home (other than you)?  No Do you feel safe at home?  Yes  Hearing Difficulties: No Do you often ask people to speak up or repeat themselves? No Do you experience ringing or noises in your ears? No  Do you have difficulty understanding soft or whispered voices? No    Cognitive Testing  Alert? Yes   Normal Appearance? Yes  Oriented to person? Yes  Place? Yes   Time? Yes  Recall of three objects?  Yes  Can perform simple calculations? Yes  Displays appropriate judgment? Yes  Can read the correct time from a watch face? Yes  Do you feel that you have a problem with memory? No  Do you  often misplace items? Occassionaly   Advanced Directives have been discussed with the patient? Yes Living Will   Current Physicians/Providers and Suppliers  1. Marcos Eke, FNP - Internal Medicine 2. Sandrea Hughs, MD - Pulmonology 3. Charlton Haws, MD - Cardiology 4. Helane Gunther, DPM - Podiatry 5. Rubye Oaks, NP - Pulmonology  Indicate any recent Medical Services you may have received from other than [facility] providers in the past year (date may be approximate).  All answers were reviewed with the patient and necessary referrals were made:  Jeanine Luz, FNP   03/24/2016    Allergies  Allergen Reactions  . Reglan [Metoclopramide]     Tremors   . Sulfa Antibiotics Other (See Comments)     shakes     Outpatient Medications Prior to Visit  Medication Sig Dispense Refill  . albuterol (PROVENTIL HFA;VENTOLIN HFA) 108 (90 BASE) MCG/ACT inhaler Inhale 2 puffs into the lungs every 4 (four) hours as needed for wheezing or shortness of breath.     . ALPRAZolam (XANAX) 0.25 MG tablet Take 0.25 mg by mouth at bedtime as needed for anxiety.    [name] aspirin 81 MG tablet Take 81 mg by mouth every morning.     [name] atorvastatin (LIPITOR) 20 MG tablet Take 20 mg by mouth daily at 6 PM.     . budesonide-formoterol (SYMBICORT) 160-4.5 MCG/ACT inhaler Inhale 2 puffs into the lungs 2 (two) times daily. 1 Inhaler 11  . Cholecalciferol (VITAMIN D3) 1000 UNITS CAPS Take 1,000 Units by mouth every morning.     . citalopram (CELEXA) 10 MG tablet Take 10 mg by mouth every morning.     . clopidogrel (PLAVIX) 75 MG tablet Take 75 mg by mouth every morning.     [name] CREON 12000 units CPEP capsule Take 12,000 mg by mouth 3 (three) times daily.    [name] diltiazem (CARDIZEM CD) 240 MG 24 hr capsule Take 240 mg by mouth every morning.     . ezetimibe (ZETIA) 10 MG tablet Take 1 tablet (10 mg total) by mouth daily. (Patient taking differently: Take 10 mg by mouth every morning. ) 30 tablet 0  . famotidine (PEPCID) 20 MG tablet One at bedtime (Patient taking differently: Take 20 mg by mouth at bedtime. One at bedtime)    . fluticasone (FLONASE) 50 MCG/ACT nasal spray Place 2 sprays into both nostrils as needed (nasal stuffiness).     . gabapentin (NEURONTIN) 300 MG capsule Take 300 mg by mouth at bedtime.    [name] guaiFENesin (MUCINEX) 600 MG 12 hr tablet Take 600 mg by mouth 2 (two) times daily as needed for cough.     . hydrochlorothiazide (HYDRODIURIL) 25 MG tablet Take 25 mg by mouth every morning.     . hydrocortisone cream 1 % Apply 1 application topically daily as needed for itching.    [name] ibuprofen (ADVIL,MOTRIN) 200 MG tablet Take as needed for joint pain    . Iron-Vitamins (THEREMS H PO) Take 1 tablet by mouth daily.     [name] levothyroxine (SYNTHROID, LEVOTHROID) 100 MCG tablet Take 100 mcg by mouth every morning.     . loratadine (CLARITIN) 10 MG tablet Take 10 mg by mouth daily as needed (drainage, drippy nose).     . metFORMIN (GLUCOPHAGE) 500 MG tablet Take 500 mg by mouth 2 (two) times daily with a meal.     . nitroGLYCERIN (NITRODUR - DOSED IN MG/24 HR) 0.6 mg/hr patch Apply patch every morning and remove at bedtime    .  nitroGLYCERIN (NITROSTAT) 0.4 MG SL tablet Place 0.4 mg under the tongue every 5 (five) minutes as needed for chest pain.     [name]. omeprazole (PRILOSEC) 20 MG capsule Take 20 mg by mouth daily before breakfast.     . polyethylene glycol powder (GLYCOLAX/MIRALAX) powder Take 1 Container by mouth daily.     . potassium chloride (MICRO-K) 10 MEQ CR capsule Take 10 mEq by mouth 2 (two) times daily.    . simethicone (GAS-X EXTRA STRENGTH) 125 MG chewable tablet Chew 1-2 tablets (125-250 mg total) by mouth 3 (three) times daily with meals. Max 4 tablets daily (Patient taking differently: Chew 125-250 mg by mouth 4 (four) times daily as needed (gas). Max 4 tablets daily) 30 tablet 0   No facility-administered medications prior to visit.      Past Medical History:  Diagnosis Date  . Asthma   . Atrial fibrillation (HCC)   . Chest pain   . Diabetes mellitus without complication (HCC)   . GERD (gastroesophageal reflux disease)   . Hernia, hiatal   . Hyperlipidemia   . Hypertension   . Hypothyroid      Past Surgical History:  Procedure Laterality Date  . APPENDECTOMY    . CATARACT EXTRACTION    . HIATAL HERNIA REPAIR    . PERCUTANEOUS CORONARY STENT INTERVENTION (PCI-S)       Family History  Problem Relation Age of Onset  . Hypertension Mother   . Stroke Mother   . Ovarian cancer Mother   . Stroke Father   . Diabetes Son      Social History   Social History  . Marital status: Married    Spouse name: N/A  . Number of children: 2  . Years of education: 12   Occupational  History  . 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 Smoking status: Former Smoker    Packs/day: 0.50    Years: 7.00    Types: Pipe, Cigars    Quit date: 04/27/1968  . Smokeless tobacco: Never Used  . Alcohol use No  . Drug use: No  . Sexual activity: Not on file   Other Topics Concern  . Not on file   Social History Narrative   [place] Residing in [place] with his wife. Denies any beliefs effecting healthcare.      Review of Systems  Constitutional: Denies fever, chills, fatigue, or significant weight gain/loss. HENT: Head: Denies headache or neck pain Ears: Denies changes in hearing, ringing in ears, earache, drainage Nose: Denies discharge, stuffiness, itching, nosebleed, sinus pain Throat: Denies sore throat, hoarseness, dry mouth, sores, thrush Eyes: Denies loss/changes in vision, pain, redness, blurry/double vision, flashing lights Cardiovascular: Denies chest pain/discomfort, tightness, palpitations, shortness of breath with activity, difficulty lying down, swelling, sudden awakening with shortness of breath Respiratory: Denies shortness of breath, cough, sputum production, wheezing Gastrointestinal: Denies dysphasia, heartburn, change in appetite, nausea, change in bowel habits, rectal bleeding, constipation, diarrhea, yellow skin or eyes Genitourinary: Denies frequency, urgency, burning/pain, blood in urine, incontinence, change in urinary strength. Musculoskeletal: Denies muscle/joint pain, stiffness, back pain, redness or swelling of joints, trauma Skin: Denies rashes, lumps, itching, dryness, color changes, or hair/nail changes Neurological: Denies dizziness, fainting, seizures, weakness, numbness, tingling, tremor Psychiatric - Denies nervousness, stress, depression or memory loss Endocrine: Denies heat or cold intolerance, sweating, frequent urination, excessive thirst, changes in  appetite Hematologic:  Denies ease of bruising or bleeding    Objective:     BP (!) 160/60 (BP Location: Left Arm, Patient Position: Sitting, Cuff Size: Normal)   Pulse (!) 48   Temp 97.8 F (36.6 C) (Oral)   Resp 14   Ht 5\' 8"  (1.727 m)   Wt 170 lb (77.1 kg)   SpO2 98%   BMI 25.85 kg/m  Nursing note and vital signs reviewed.  Physical Exam  Constitutional: He is oriented to person, place, and time. He appears well-developed and well-nourished. No distress.  Cardiovascular: Normal rate, regular rhythm, normal heart sounds and intact distal pulses.   Pulmonary/Chest: Effort normal and breath sounds normal.  Neurological: He is alert and oriented to person, place, and time.  Skin: Skin is warm and dry.  Neck - skin lesion elevated with pink borders and scab located in the middle. No tenderness to touch. No discharge.  Psychiatric: He has a normal mood and affect. His behavior is normal. Judgment and thought content normal.       Assessment & Plan:   During the course of the visit the patient was educated and counseled about appropriate screening and preventive services including:    Pneumococcal vaccine   Influenza vaccine  Prostate cancer screening  Diabetes screening  Glaucoma screening  Diet review for nutrition referral? Yes ____  Not Indicated _X___   Patient Instructions (the written plan) was given to the patient.  Medicare Attestation I have personally reviewed: The patient's medical and social history Their use of alcohol, tobacco or illicit drugs Their current medications and supplements The patient's functional ability including ADLs,fall risks, home safety risks, cognitive, and hearing and visual impairment Diet and physical activities Evidence for depression or mood disorders  The patient's weight, height, BMI,  have been recorded in the chart.  I have made referrals, counseling, and provided education to the patient based on review of the above and  I have provided the patient with a written personalized care plan for preventive services.     Problem List Items Addressed This Visit      Cardiovascular and Mediastinum   Atrial fibrillation (HCC) - Primary    Continues to express atrial fibrillation appears stable with current regimen and anticoagulated with aspirin and Plavix. No nuisance bleeding. Continue to monitor.      Essential hypertension    Blood pressure slightly elevated today. Encouraged to continue blood pressure medications and decrease sodium in diet. Monitor blood pressure once daily. Follow-up if blood pressure remains elevated.      Relevant Orders   Comprehensive metabolic panel (Completed)   CBC (Completed)     Respiratory   COPD GOLD II     COPD: Stage II appears adequately controlled with current medication regimen and recently advised by pulmonology to follow-up as needed and can be managed by primary care. Continue to monitor.        Endocrine   Controlled type 2 diabetes mellitus (HCC)    Type 2 diabetes appears adequately controlled current medication regimen. Continue current dosage of metformin and continue to monitor blood sugars at home.      Relevant Orders   Comprehensive metabolic panel (Completed)   CBC (Completed)     Musculoskeletal and Integument   Skin lesion of neck    Skin lesion of neck concerning for squamous cell carcinoma. Refer to dermatology for biopsy and further assessment. Follow-up if symptoms worsen or fail to improve prior to referral.  Relevant Orders   Ambulatory referral to Dermatology     Other   Hyperlipidemia    Hyperlipidemia appears stable with current medication regimen and no adverse side effects. No myalgias. Continue to monitor.      Relevant Orders   Lipid Profile (Completed)   Comprehensive metabolic panel (Completed)   CBC (Completed)   Medicare annual wellness visit, subsequent    Reviewed and updated patient's medical, surgical, family and  social history. Medications and allergies were also reviewed. Basic screenings for depression, activities of daily living, hearing, cognition and safety were performed. Provider list was updated and health plan was provided to the patient.   1) Anticipatory Guidance: Discussed importance of wearing a seatbelt while driving and not texting while driving; changing batteries in smoke detector at least once annually; wearing suntan lotion when outside; eating a balanced and moderate diet; getting physical activity at least 30 minutes per day.  2) Immunizations / Screenings / Labs:  Declines Zostavax. All immunizations are up-to-date per recommendations. Obtain PSA for prostate cancer screening. All other screenings are up-to-date per recommendations. Obtain CBC, CMET, and lipid profile.  Overall well exam with risk factors for cardiovascular disease including coronary artery disease, hypertension, hyperlipidemia, and type 2 diabetes. Chronic conditions appear adequately controlled through medication regimens. No adverse side effects. Continues to live and [facility] without complications. Continue healthy lifestyle behaviors and choices. Follow-up prevention exam in 1 year. Follow-up office visit pending blood work and for chronic conditions.          Other Visit Diagnoses    Screening for prostate cancer       Relevant Orders   PSA, Medicare (Completed)       I am having [name] maintain his aspirin, atorvastatin, citalopram, clopidogrel, diltiazem, polyethylene glycol powder, hydrochlorothiazide, levothyroxine, metFORMIN, nitroGLYCERIN, potassium chloride, Vitamin D3, guaiFENesin, nitroGLYCERIN, omeprazole, ibuprofen, gabapentin, albuterol, fluticasone, loratadine, famotidine, ALPRAZolam, simethicone, ezetimibe, CREON, Iron-Vitamins (THEREMS H PO), hydrocortisone cream, and budesonide-formoterol.   Follow-up: Return in about 3 months (around 06/24/2016), or if symptoms  worsen or fail to improve.   Jeanine Luzalone, Trimaine Maser, FNP

## 2016-03-24 NOTE — Assessment & Plan Note (Signed)
Skin lesion of neck concerning for squamous cell carcinoma. Refer to dermatology for biopsy and further assessment. Follow-up if symptoms worsen or fail to improve prior to referral.

## 2016-03-24 NOTE — Assessment & Plan Note (Signed)
Blood pressure slightly elevated today. Encouraged to continue blood pressure medications and decrease sodium in diet. Monitor blood pressure once daily. Follow-up if blood pressure remains elevated.

## 2016-03-24 NOTE — Patient Instructions (Signed)
Thank you for choosing [facility].  SUMMARY AND INSTRUCTIONS:   Labs:  Please stop by the lab on the lower level of the building for your blood work. Your results will be released to MyChart (or called to you) after review, usually within 72 hours after test completion. If any changes need to be made, you will be notified at that same time.  1.) The lab is open from 7:30am to 5:30 pm Monday-Friday 2.) No appointment is necessary 3.) Fasting (if needed) is 6-8 hours after food and drink; black coffee and water are okay   Referrals:  They will call to schedule your appointment with dermatology.  Referrals have been made during this visit. You should expect to hear back from our schedulers in about 7-10 days in regards to establishing an appointment with the specialists we discussed.   Follow up:  If your symptoms worsen or fail to improve, please contact our office for further instruction, or in case of emergency go directly to the emergency room at the closest medical facility.    Health Maintenance  Topic Date Due  . ZOSTAVAX  04/24/1992  . HEMOGLOBIN A1C  07/22/2016  . OPHTHALMOLOGY EXAM  08/22/2016  . FOOT EXAM  01/22/2017  . URINE MICROALBUMIN  01/22/2017  . TETANUS/TDAP  01/22/2025  . INFLUENZA VACCINE  Completed  . PNA vac Low Risk Adult  Addressed    Health Maintenance, Male A healthy lifestyle and preventative care can promote health and wellness.  Maintain regular health, dental, and eye exams.  Eat a healthy diet. Foods like vegetables, fruits, whole grains, low-fat dairy products, and lean protein foods contain the nutrients you need and are low in calories. Decrease your intake of foods high in solid fats, added sugars, and salt. Get information about a proper diet from your health care provider, if necessary.  Regular physical exercise is one of the most important things you can do for your health. Most adults should get at least 150 minutes of  moderate-intensity exercise (any activity that increases your heart rate and causes you to sweat) each week. In addition, most adults need muscle-strengthening exercises on 2 or more days a week.   Maintain a healthy weight. The body mass index (BMI) is a screening tool to identify possible weight problems. It provides an estimate of body fat based on height and weight. Your health care provider can find your BMI and can help you achieve or maintain a healthy weight. For males 20 years and older:  A BMI below 18.5 is considered underweight.  A BMI of 18.5 to 24.9 is normal.  A BMI of 25 to 29.9 is considered overweight.  A BMI of 30 and above is considered obese.  Maintain normal blood lipids and cholesterol by exercising and minimizing your intake of saturated fat. Eat a balanced diet with plenty of fruits and vegetables. Blood tests for lipids and cholesterol should begin at age 80 and be repeated every 5 years. If your lipid or cholesterol levels are high, you are over age 80, or you are at high risk for heart disease, you may need your cholesterol levels checked more frequently.Ongoing high lipid and cholesterol levels should be treated with medicines if diet and exercise are not working.  If you smoke, find out from your health care provider how to quit. If you do not use tobacco, do not start.  Lung cancer screening is recommended for adults aged 55-80 years who are at high risk for developing lung  cancer because of a history of smoking. A yearly low-dose CT scan of the lungs is recommended for people who have at least a 30-pack-year history of smoking and are current smokers or have quit within the past 15 years. A pack year of smoking is smoking an average of 1 pack of cigarettes a day for 1 year (for example, a 30-pack-year history of smoking could mean smoking 1 pack a day for 30 years or 2 packs a day for 15 years). Yearly screening should continue until the smoker has stopped smoking  for at least 15 years. Yearly screening should be stopped for people who develop a health problem that would prevent them from having lung cancer treatment.  If you choose to drink alcohol, do not have more than 2 drinks per day. One drink is considered to be 12 oz (360 mL) of beer, 5 oz (150 mL) of wine, or 1.5 oz (45 mL) of liquor.  Avoid the use of street drugs. Do not share needles with anyone. Ask for help if you need support or instructions about stopping the use of drugs.  High blood pressure causes heart disease and increases the risk of stroke. High blood pressure is more likely to develop in:  People who have blood pressure in the end of the normal range (100-139/85-89 mm Hg).  People who are overweight or obese.  People who are African American.  If you are 50-31 years of age, have your blood pressure checked every 3-5 years. If you are 76 years of age or older, have your blood pressure checked every year. You should have your blood pressure measured twice-once when you are at a hospital or clinic, and once when you are not at a hospital or clinic. Record the average of the two measurements. To check your blood pressure when you are not at a hospital or clinic, you can use:  An automated blood pressure machine at a pharmacy.  A home blood pressure monitor.  If you are 66-7 years old, ask your health care provider if you should take aspirin to prevent heart disease.  Diabetes screening involves taking a blood sample to check your fasting blood sugar level. This should be done once every 3 years after age 13 if you are at a normal weight and without risk factors for diabetes. Testing should be considered at a younger age or be carried out more frequently if you are overweight and have at least 1 risk factor for diabetes.  Colorectal cancer can be detected and often prevented. Most routine colorectal cancer screening begins at the age of 35 and continues through age 39. However, your  health care provider may recommend screening at an earlier age if you have risk factors for colon cancer. On a yearly basis, your health care provider may provide home test kits to check for hidden blood in the stool. A small camera at the end of a tube may be used to directly examine the colon (sigmoidoscopy or colonoscopy) to detect the earliest forms of colorectal cancer. Talk to your health care provider about this at age 73 when routine screening begins. A direct exam of the colon should be repeated every 5-10 years through age 65, unless early forms of precancerous polyps or small growths are found.  People who are at an increased risk for hepatitis B should be screened for this virus. You are considered at high risk for hepatitis B if:  You were born in a country where hepatitis B occurs  often. Talk with your health care provider about which countries are considered high risk.  Your parents were born in a high-risk country and you have not received a shot to protect against hepatitis B (hepatitis B vaccine).  You have HIV or AIDS.  You use needles to inject street drugs.  You live with, or have sex with, someone who has hepatitis B.  You are a man who has sex with other men (MSM).  You get hemodialysis treatment.  You take certain medicines for conditions like cancer, organ transplantation, and autoimmune conditions.  Hepatitis C blood testing is recommended for all people born from [id number] through 1965 and any individual with known risk factors for hepatitis C.  Healthy men should no longer receive prostate-specific antigen (PSA) blood tests as part of routine cancer screening. Talk to your health care provider about prostate cancer screening.  Testicular cancer screening is not recommended for adolescents or adult males who have no symptoms. Screening includes self-exam, a health care provider exam, and other screening tests. Consult with your health care provider about any symptoms you  have or any concerns you have about testicular cancer.  Practice safe sex. Use condoms and avoid high-risk sexual practices to reduce the spread of sexually transmitted infections (STIs).  You should be screened for STIs, including gonorrhea and chlamydia if:  You are sexually active and are younger than 24 years.  You are older than 24 years, and your health care provider tells you that you are at risk for this type of infection.  Your sexual activity has changed since you were last screened, and you are at an increased risk for chlamydia or gonorrhea. Ask your health care provider if you are at risk.  If you are at risk of being infected with HIV, it is recommended that you take a prescription medicine daily to prevent HIV infection. This is called pre-exposure prophylaxis (PrEP). You are considered at risk if:  You are a man who has sex with other men (MSM).  You are a heterosexual man who is sexually active with multiple partners.  You take drugs by injection.  You are sexually active with a partner who has HIV.  Talk with your health care provider about whether you are at high risk of being infected with HIV. If you choose to begin PrEP, you should first be tested for HIV. You should then be tested every 3 months for as long as you are taking PrEP.  Use sunscreen. Apply sunscreen liberally and repeatedly throughout the day. You should seek shade when your shadow is shorter than you. Protect yourself by wearing long sleeves, pants, a wide-brimmed hat, and sunglasses year round whenever you are outdoors.  Tell your health care provider of new moles or changes in moles, especially if there is a change in shape or color. Also, tell your health care provider if a mole is larger than the size of a pencil eraser.  A one-time screening for abdominal aortic aneurysm (AAA) and surgical repair of large AAAs by ultrasound is recommended for men aged 65-75 years who are current or former  smokers.  Stay current with your vaccines (immunizations). This information is not intended to replace advice given to you by your health care provider. Make sure you discuss any questions you have with your health care provider. Document Released: 10/10/2007 Document Revised: 05/04/2014 Document Reviewed: 01/15/2015 Elsevier Interactive Patient Education  2017 [employer]

## 2016-05-29 ENCOUNTER — Ambulatory Visit: Payer: Medicare Other | Admitting: Podiatry

## 2016-06-03 ENCOUNTER — Other Ambulatory Visit: Payer: Self-pay

## 2016-06-03 MED ORDER — FAMOTIDINE 20 MG PO TABS: 20.0000 mg | ORAL_TABLET | Freq: Every day | ORAL | 0 refills | Status: DC

## 2016-08-17 ENCOUNTER — Ambulatory Visit (INDEPENDENT_AMBULATORY_CARE_PROVIDER_SITE_OTHER)
Admission: RE | Admit: 2016-08-17 | Discharge: 2016-08-17 | Disposition: A | Discharge status: Home / Self Care | Payer: Medicare Other | Source: Ambulatory Visit | Attending: Family | Admitting: Family

## 2016-08-17 ENCOUNTER — Ambulatory Visit (INDEPENDENT_AMBULATORY_CARE_PROVIDER_SITE_OTHER): Payer: Medicare Other | Admitting: Family

## 2016-08-17 ENCOUNTER — Encounter: Payer: Self-pay | Admitting: Family

## 2016-08-17 VITALS — BP 110/60 | HR 53 | Temp 98.1°F | Resp 16 | Ht 68.0 in | Wt 168.8 lb

## 2016-08-17 DIAGNOSIS — M546 Pain in thoracic spine: Secondary | ICD-10-CM | POA: Diagnosis not present

## 2016-08-17 DIAGNOSIS — W19XXXA Unspecified fall, initial encounter: Secondary | ICD-10-CM

## 2016-08-17 HISTORY — DX: Pain in thoracic spine: M54.6

## 2016-08-17 NOTE — Patient Instructions (Signed)
Thank you for choosing [facility].  SUMMARY AND INSTRUCTIONS:  Please use Tylenol as needed for pain 500 mg 3x per day for 5-7 days.  Ice x 20 minutes every 2 hours and as needed.  Further treatment pending x-ray results.   Medication:  Your prescription(s) have been submitted to your pharmacy or been printed and provided for you. Please take as directed and contact our office if you believe you are having problem(s) with the medication(s) or have any questions.  Imaging / Radiology:  Please stop by radiology on the basement level of the building for your x-rays. Your results will be released to MyChart (or called to you) after review, usually within 72 hours after test completion. If any treatments or changes are necessary, you will be notified at that same time.  Follow up:  If your symptoms worsen or fail to improve, please contact our office for further instruction, or in case of emergency go directly to the emergency room at the closest medical facility.    Chest Contusion, Adult A chest contusion is a deep bruise on the chest. Bruises happen when an injury causes bleeding under the skin. Signs of bruising include pain, puffiness (swelling), and skin that has changed from its normal color. The bruise may turn blue, purple, or yellow. Minor injuries may give you a painless bruise, but worse bruises may stay painful and swollen for a few weeks. Follow these instructions at home:  If directed, put ice on the injured area.  Put ice in a plastic bag.  Place a towel between your skin and the bag.  Leave the ice on for 20 minutes, 2-3 times per day.  Take over-the-counter and prescription medicines only as told by your doctor.  If told by your doctor, do deep-breathing exercises.  Do not lie down flat on your back. Keep your head and chest raised (elevated) when you rest or sleep.  Do not use any products that contain nicotine or tobacco, such as cigarettes and  e-cigarettes. If you need help quitting, ask your doctor.  Do not lift anything that causes pain. Contact a doctor if:  Medicines or treatment do not help your swelling or pain.  You have more bruising.  You have more swelling.  Your pain gets worse  Your symptoms do not get better in one week. Get help right away if:  You suddenly have a lot more pain.  You have trouble breathing.  You feel dizzy or weak.  You pass out (faint).  You have blood in your pee (urine) or poop (stool).  You cough up blood or you throw up (vomit) blood. Summary  A chest contusion is a deep bruise on the chest. Bruises happen when an injury causes bleeding under the skin.  Treatment may include resting and putting ice on the injured area.  Contact a doctor if you have trouble breathing or if your pain does not get better with treatment. This information is not intended to replace advice given to you by your health care provider. Make sure you discuss any questions you have with your health care provider. Document Released: 09/30/2007 Document Revised: 01/09/2016 Document Reviewed: 01/09/2016 Elsevier Interactive Patient Education  2017 [employer].

## 2016-08-17 NOTE — Assessment & Plan Note (Addendum)
Acute right-sided thoracic and chest wall pain associated with previous fall. Symptoms and exam are consistent with contusions. Although there are concerns for possible fractures. Given mechanism of injury. Obtain thoracic spine and right rib x-rays given age and increased risk for fracture.  Bruising/contusions likely associated with anticoagulation. Recommend discontinuing ibuprofen. Encouraging there is minimal pain. Treat conservatively with Tylenol and icing regimen. Follow-up pending x-ray results.

## 2016-08-17 NOTE — Progress Notes (Signed)
Subjective:    Patient ID: Benjamin Mccall, male    DOB: 1929-08-11, 83 y.o.   MRN: [id number]  Chief Complaint  Patient presents with  . Fall    having some pain from a fall, back pain and side pain    HPI:  Benjamin Mccall is a 83 y.o. male who  has a past medical history of Asthma; Atrial fibrillation (HCC); Chest pain; Diabetes mellitus without complication (HCC); GERD (gastroesophageal reflux disease); Hernia, hiatal; Hyperlipidemia; Hypertension; and Hypothyroid. and presents today for an office visit.   This is a new problem. He was standing at the commode when he suddenly lost his balance falling backwards and struck his washbasin with his right shoulder. Denies any head injury or loss of consciousness. Does have 2 wounds. Pain is sore on his rib and the posterior aspect of his shoulder. Modifying factors include ibuprofen which did help.   Allergies  Allergen Reactions  . Reglan [Metoclopramide]     Tremors   . Sulfa Antibiotics Other (See Comments)    shakes      Outpatient Medications Prior to Visit  Medication Sig Dispense Refill  . albuterol (PROVENTIL HFA;VENTOLIN HFA) 108 (90 BASE) MCG/ACT inhaler Inhale 2 puffs into the lungs every 4 (four) hours as needed for wheezing or shortness of breath.     . ALPRAZolam (XANAX) 0.25 MG tablet Take 0.25 mg by mouth at bedtime as needed for anxiety.    [name] aspirin 81 MG tablet Take 81 mg by mouth every morning.     [name] atorvastatin (LIPITOR) 20 MG tablet Take 20 mg by mouth daily at 6 PM.     . budesonide-formoterol (SYMBICORT) 160-4.5 MCG/ACT inhaler Inhale 2 puffs into the lungs 2 (two) times daily. 1 Inhaler 11  . Cholecalciferol (VITAMIN D3) 1000 UNITS CAPS Take 1,000 Units by mouth every morning.     . citalopram (CELEXA) 10 MG tablet Take 10 mg by mouth every morning.     . clopidogrel (PLAVIX) 75 MG tablet Take 75 mg by mouth every morning.     [name] CREON 12000 units CPEP capsule Take 12,000 mg by mouth 3 (three) times  daily.    [name] diltiazem (CARDIZEM CD) 240 MG 24 hr capsule Take 240 mg by mouth every morning.     . ezetimibe (ZETIA) 10 MG tablet Take 1 tablet (10 mg total) by mouth daily. (Patient taking differently: Take 10 mg by mouth every morning. ) 30 tablet 0  . famotidine (PEPCID) 20 MG tablet Take 1 tablet (20 mg total) by mouth at bedtime. One at bedtime 90 tablet 0  . fluticasone (FLONASE) 50 MCG/ACT nasal spray Place 2 sprays into both nostrils as needed (nasal stuffiness).     . gabapentin (NEURONTIN) 300 MG capsule Take 300 mg by mouth at bedtime.    [name] guaiFENesin (MUCINEX) 600 MG 12 hr tablet Take 600 mg by mouth 2 (two) times daily as needed for cough.     . hydrochlorothiazide (HYDRODIURIL) 25 MG tablet Take 25 mg by mouth every morning.     . hydrocortisone cream 1 % Apply 1 application topically daily as needed for itching.    [name] ibuprofen (ADVIL,MOTRIN) 200 MG tablet Take as needed for joint pain    . Iron-Vitamins (THEREMS H PO) Take 1 tablet by mouth daily.    [name] levothyroxine (SYNTHROID, LEVOTHROID) 100 MCG tablet Take 100 mcg by mouth every morning.     . loratadine (CLARITIN) 10 MG tablet Take  10 mg by mouth daily as needed (drainage, drippy nose).     . metFORMIN (GLUCOPHAGE) 500 MG tablet Take 500 mg by mouth 2 (two) times daily with a meal.     . nitroGLYCERIN (NITRODUR - DOSED IN MG/24 HR) 0.6 mg/hr patch Apply patch every morning and remove at bedtime    . nitroGLYCERIN (NITROSTAT) 0.4 MG SL tablet Place 0.4 mg under the tongue every 5 (five) minutes as needed for chest pain.     [name] omeprazole (PRILOSEC) 20 MG capsule Take 20 mg by mouth daily before breakfast.     . polyethylene glycol powder (GLYCOLAX/MIRALAX) powder Take 1 Container by mouth daily.     . potassium chloride (MICRO-K) 10 MEQ CR capsule Take 10 mEq by mouth 2 (two) times daily.    . simethicone (GAS-X EXTRA STRENGTH) 125 MG chewable tablet Chew 1-2 tablets (125-250 mg total) by mouth 3 (three) times daily with meals.  Max 4 tablets daily (Patient taking differently: Chew 125-250 mg by mouth 4 (four) times daily as needed (gas). Max 4 tablets daily) 30 tablet 0   No facility-administered medications prior to visit.       Past Surgical History:  Procedure Laterality Date  . APPENDECTOMY    . CATARACT EXTRACTION    . HIATAL HERNIA REPAIR    . PERCUTANEOUS CORONARY STENT INTERVENTION (PCI-S)        Past Medical History:  Diagnosis Date  . Asthma   . Atrial fibrillation (HCC)   . Chest pain   . Diabetes mellitus without complication (HCC)   . GERD (gastroesophageal reflux disease)   . Hernia, hiatal   . Hyperlipidemia   . Hypertension   . Hypothyroid       Review of Systems  Constitutional: Negative for chills and fever.  Musculoskeletal: Positive for back pain.  Neurological: Negative for weakness and numbness.      Objective:    BP 110/60 (BP Location: Left Arm, Patient Position: Sitting, Cuff Size: Normal)   Pulse (!) 53   Temp 98.1 F (36.7 C) (Oral)   Resp 16   Ht  (1.727 m)   Wt 168 lb 12.8 oz (76.6 kg)   SpO2 96%   BMI 25.67 kg/m  Nursing note and vital signs reviewed.  Physical Exam  Constitutional: He is oriented to person, place, and time. He appears well-developed and well-nourished. No distress.  Cardiovascular: Normal rate, regular rhythm, normal heart sounds and intact distal pulses.   Pulmonary/Chest: Effort normal and breath sounds normal.  Musculoskeletal:  Thoracic spine / chest wall -  There is mild edema of the thoracic spine around transverse processes 7-9 with associated discoloration in the right lateral ribs. No crepitus with negative compression test.   Neurological: He is alert and oriented to person, place, and time.  Skin: Skin is warm and dry.  Psychiatric: He has a normal mood and affect. His behavior is normal. Judgment and thought content normal.       Assessment & Plan:   Problem List Items Addressed This Visit      Other   Acute  right-sided thoracic back pain - Primary    Acute right-sided thoracic and chest wall pain associated with previous fall. Symptoms and exam are consistent with contusions. Although there are concerns for possible fractures. Given mechanism of injury. Obtain thoracic spine and right rib x-rays given age and increased risk for fracture.  Bruising/contusions likely associated with anticoagulation. Recommend discontinuing ibuprofen. Encouraging there is minimal  pain. Treat conservatively with Tylenol and icing regimen. Follow-up pending x-ray results.      Relevant Orders   DG Thoracic Spine W/Swimmers   DG Ribs Unilateral Right    Other Visit Diagnoses    Fall, initial encounter           I am having Mr. Iwanicki maintain his aspirin, atorvastatin, citalopram, clopidogrel, diltiazem, polyethylene glycol powder, hydrochlorothiazide, levothyroxine, metFORMIN, nitroGLYCERIN, potassium chloride, Vitamin D3, guaiFENesin, nitroGLYCERIN, omeprazole, ibuprofen, gabapentin, albuterol, fluticasone, loratadine, ALPRAZolam, simethicone, ezetimibe, CREON, Iron-Vitamins (THEREMS H PO), hydrocortisone cream, budesonide-formoterol, and famotidine.   Follow-up: Return if symptoms worsen or fail to improve.  Jeanine Luz, FNP

## 2016-08-25 LAB — HM DIABETES EYE EXAM

## 2016-08-26 ENCOUNTER — Encounter: Payer: Self-pay | Admitting: Family

## 2016-12-07 ENCOUNTER — Telehealth: Payer: Self-pay | Admitting: Family

## 2016-12-07 NOTE — Telephone Encounter (Signed)
Daughter in law Marcelino DusterMichelle called asking if you have recieved it a form called [employer] medication questionaire it was mailed 7/24.  Marcelino DusterMichelle [phone]

## 2016-12-08 NOTE — Telephone Encounter (Signed)
They called back checking on this. They are very frustrated that it has not been done yet. Do you know anything about it?

## 2016-12-08 NOTE — Telephone Encounter (Signed)
I have spoken with daughter.  She is going to get insurance company to fax this form over to my attention and to get company to follow up to make sure it was received.  Daughter states deline is 12/25/16 or benefits will be cut off.  I informed daughter that we would work with the insurance company in any way we could.

## 2016-12-08 NOTE — Telephone Encounter (Signed)
I have not seen this form at all. Will you please check on this for me. Thanks so much

## 2016-12-15 ENCOUNTER — Telehealth: Payer: Self-pay | Admitting: Family

## 2016-12-15 NOTE — Telephone Encounter (Signed)
I have called [employer] And asked that they send over Physician's Medication Administration Questionnaire that pt and daughter have called about. Gave them the direct fax number to [employer]. Waiting for the from to be faxed.

## 2016-12-15 NOTE — Telephone Encounter (Signed)
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  States the insurance policy is what keeps him in assisted living.  States checks will stop in September if forms are not turned back in.  Please see phone note dated 8/13.  States Daughter got insurance company to fax again on 8/17.  I have notified patient that I have not seen this form.  Have you guys received this form?

## 2016-12-16 NOTE — Telephone Encounter (Signed)
Pt called regarding this, informed him that we have not recieved it and have them to directly resend it, he is going to call them as well this morning.

## 2016-12-18 NOTE — Telephone Encounter (Signed)
Pt returned Taylor's call. There was one question that she was not sure of (is he able to administer and take his medications on his own or does he have someone help him?).  He said that the CNA at the assisted living facility comes and gives him his medications each day.

## 2016-12-18 NOTE — Telephone Encounter (Signed)
LVM for pt to call back. Did receive form.

## 2016-12-21 NOTE — Telephone Encounter (Signed)
Form faxed

## 2016-12-21 NOTE — Telephone Encounter (Signed)
Form has been faxed.

## 2016-12-22 NOTE — Telephone Encounter (Signed)
Form has also been mailed.

## 2017-01-06 ENCOUNTER — Ambulatory Visit (INDEPENDENT_AMBULATORY_CARE_PROVIDER_SITE_OTHER): Payer: Medicare Other | Admitting: Podiatry

## 2017-01-06 ENCOUNTER — Encounter: Payer: Self-pay | Admitting: Podiatry

## 2017-01-06 DIAGNOSIS — B351 Tinea unguium: Secondary | ICD-10-CM

## 2017-01-06 DIAGNOSIS — M79674 Pain in right toe(s): Secondary | ICD-10-CM | POA: Diagnosis not present

## 2017-01-06 DIAGNOSIS — E119 Type 2 diabetes mellitus without complications: Secondary | ICD-10-CM

## 2017-01-06 DIAGNOSIS — M79675 Pain in left toe(s): Secondary | ICD-10-CM

## 2017-01-06 NOTE — Progress Notes (Signed)
Patient ID: Benjamin Mccall, male   DOB: 01-Mar-1930, 83 y.o.   MRN: [id number] Complaint:  Visit Type: Patient returns to my office for continued preventative foot care services. Complaint: Patient states" my nails have grown long and thick and become painful to walk and wear shoes" Patient has been diagnosed with DM with no complications. He presents for preventative foot care services. No changes to ROS  Podiatric Exam: Vascular: dorsalis pedis and posterior tibial pulses are palpable bilateral. Capillary return is immediate. Temperature gradient is WNL. Skin turgor WNL  Sensorium: Normal Semmes Weinstein monofilament test. Normal tactile sensation bilaterally. Nail Exam: Pt has thick disfigured discolored nails with subungual debris noted bilateral entire nail hallux through fifth toenails Ulcer Exam: There is no evidence of ulcer or pre-ulcerative changes or infection. Orthopedic Exam: Muscle tone and strength are WNL. No limitations in general ROM. No crepitus or effusions noted. Foot type and digits show no abnormalities. Bony prominences are unremarkable. Skin: No Porokeratosis. No infection or ulcers  Diagnosis:  Tinea unguium, Pain in right toe, pain in left toes  Treatment & Plan Procedures and Treatment: Consent by patient was obtained for treatment procedures. The patient understood the discussion of treatment and procedures well. All questions were answered thoroughly reviewed. Debridement of mycotic and hypertrophic toenails, 1 through 5 bilateral and clearing of subungual debris. No ulceration, no infection noted.  Return Visit-Office Procedure: Patient instructed to return to the office for a follow up visit 3 months for continued evaluation and treatment.   Benjamin Mccall DPM

## 2017-01-15 NOTE — Telephone Encounter (Signed)
Pt called in said that this place still has not got this form.  Do you have a copy of this form, he wants to come by and pick up a copy of it

## 2017-01-18 ENCOUNTER — Telehealth: Payer: Self-pay | Admitting: Family

## 2017-01-18 NOTE — Telephone Encounter (Signed)
Printed copy of letter out for patient to pick up

## 2017-01-26 ENCOUNTER — Ambulatory Visit (INDEPENDENT_AMBULATORY_CARE_PROVIDER_SITE_OTHER)
Admission: RE | Admit: 2017-01-26 | Discharge: 2017-01-26 | Disposition: A | Discharge status: Home / Self Care | Payer: Medicare Other | Source: Ambulatory Visit | Attending: Internal Medicine | Admitting: Internal Medicine

## 2017-01-26 ENCOUNTER — Ambulatory Visit (INDEPENDENT_AMBULATORY_CARE_PROVIDER_SITE_OTHER): Payer: Medicare Other | Admitting: Internal Medicine

## 2017-01-26 ENCOUNTER — Encounter: Payer: Self-pay | Admitting: Internal Medicine

## 2017-01-26 VITALS — BP 104/60 | HR 56 | Ht 68.0 in | Wt 165.0 lb

## 2017-01-26 DIAGNOSIS — Z23 Encounter for immunization: Secondary | ICD-10-CM | POA: Diagnosis not present

## 2017-01-26 DIAGNOSIS — J449 Chronic obstructive pulmonary disease, unspecified: Secondary | ICD-10-CM | POA: Diagnosis not present

## 2017-01-26 MED ORDER — GLYCOPYRROLATE-FORMOTEROL 9-4.8 MCG/ACT IN AERO: 2.0000 | INHALATION_SPRAY | Freq: Two times a day (BID) | RESPIRATORY_TRACT | 0 refills | Status: AC

## 2017-01-26 NOTE — Patient Instructions (Addendum)
Bevespi Take 2 puffs first thing in am and then another 2 puffs about 12 hours later.   Only use your albuterol as a rescue medication to be used if you can't catch your breath by resting or doing a relaxed purse lip breathing pattern.  - The less you use it, the better it will work when you need it. - Ok to use up to 2 puffs  every 4 hours if you must but call for immediate appointment if use goes up over your usual need - Don't leave home without it !!  (think of it like the spare tire for your car)    Please remember to go to the x-ray department downstairs in the basement  for your tests - we will call you with the results when they are available.      Please schedule a follow up office visit in 4 weeks, sooner if needed  With pfts on return

## 2017-01-26 NOTE — Progress Notes (Signed)
Subjective:    Patient ID: Benjamin Mccall, male    DOB: 01-16-1930    MRN: [id number]    Brief patient profile:  1  yowm quit smoking at dx of sarcoid at age 83 dx in [place] by surgeon with neck node with cough/sob rx prednisone x at least a decade with chronic doe ever since then x yardwork and leveled off d/c prednisone around age 56 referred to pulmonary 03/27/2014  by [facility] care with doe s  Cough  With GOLD II copd/ symptoms improved off ACEi    History of Present Illness  03/27/2014 1st [facility] office visit/ Benjamin Mccall   Chief Complaint  Patient presents with  . Pulmonary Consult    Referred by Benjamin Eke, NP. Pt states had lung bx age 59 and was dxed with Sarcoid. He recently moved here to [place] from [facility]. He c/o SOB that comes and goes- sometimes gets out of breath when walking the dog and exposed to cold weather.   doe is chronic and more related to how heavy he exerts   Maybe better with symbicort ? Makes him hoarse  Has combivent but doesn't think it helps/ doesn't use it rec Try symbicort 160 one twice daily to see if you don't do just as well     04/23/2015  f/u ov/Benjamin Mccall re: copd II/ on acei on symbicort 160 one bid  Chief Complaint  Patient presents with  . Follow-up    Pt states his breathing is overall doing well. He has good days and bad. He is using albuterol once per wk on average.   has 2 patterns of doe > one reproducible with walking too fast on his 3 x daily walk with dogs/ esp if > nl  Pace or up hills = MMRC1 = can walk nl pace, flat grade, can't hurry or go uphills or steps s sob   The other is unpredictable and not related to walking / can occur at rest/ assoc with dry cough and hoarseness  rec Stop lisinopril and I will let Benjamin Mccall know why it may be working against you Keep up your regular walking schedule so you can let me know if you are losing any ground between visits   05/20/15 NP ov rec Change Symbicort to Budesonide and  Brovana Neb Twice daily  > did not feel it worked as well      12/20/2015  f/u ov/Benjamin Mccall re: GOLD II copd/ maint rx symb 160 2bid very rare saba  Chief Complaint  Patient presents with  . Follow-up    Breathing overall doing well. He states he has only used rescue inhaler x 3 since the last visit.   as long as it is cool can make it up the hill at [place] and [place] s stopping using cane and no 02 / does fine on flat surfaces at [facility] No noct symtpoms/ throat ok  rec No change in medications  Only use your albuterol as a rescue medication     01/26/2017  f/u ov/Benjamin Mccall re: re-establish   COPD GOLD II on symb 160 2bid with poor baseline hfa  Chief Complaint  Patient presents with  . Acute Visit    Pt c/o increased DOE x 2 wks- gets winded walking 1/4 mile. He is using his proair inhaler 1 x per day on average.    has not used proair and then rechallenged prior to exertion  No noct symptoms unless has to get up to  urinate then sob back to bed  Doe = MMRC2 = can't walk a nl pace on a flat grade s sob but does fine slow and flat      No obvious patterns in day to day or daytime variability or assoc excess/ purulent sputum or mucus plugs or hemoptysis or cp or chest tightness, subjective wheeze or overt sinus or hb symptoms. No unusual exp hx or h/o childhood pna/ asthma or knowledge of premature birth.  Sleeping ok flat without nocturnal  or early am exacerbation  of respiratory  c/o's or need for noct saba. Also denies any obvious fluctuation of symptoms with weather or environmental changes or other aggravating or alleviating factors except as outlined above   Current Allergies, Complete Past Medical History, Past Surgical History, Family History, and Social History were reviewed in [employer] record.  ROS  The following are not active complaints unless bolded Hoarseness, sore throat, dysphagia, dental problems, itching, sneezing,  nasal congestion or discharge of  excess mucus or purulent secretions, ear ache,   fever, chills, sweats, unintended wt loss or wt gain, classically pleuritic or exertional cp,  orthopnea pnd or leg swelling, presyncope, palpitations, abdominal pain, anorexia, nausea, vomiting, diarrhea  or change in bowel habits or change in bladder habits, change in stools or change in urine, dysuria, hematuria,  rash, arthralgias, visual complaints, headache, numbness, weakness or ataxia or problems with walking or coordination,  change in mood/affect or memory.        Current Meds  Medication Sig  . acetaminophen (TYLENOL) 325 MG tablet Take 650 mg by mouth every 6 (six) hours as needed.  [name] albuterol (PROVENTIL HFA;VENTOLIN HFA) 108 (90 BASE) MCG/ACT inhaler Inhale 2 puffs into the lungs every 4 (four) hours as needed for wheezing or shortness of breath.   . ALPRAZolam (XANAX) 0.25 MG tablet Take 0.25 mg by mouth at bedtime as needed for anxiety.  [name] aspirin 81 MG tablet Take 81 mg by mouth every morning.   [name] atorvastatin (LIPITOR) 20 MG tablet Take 20 mg by mouth daily at 6 PM.   . budesonide-formoterol (SYMBICORT) 160-4.5 MCG/ACT inhaler Inhale 2 puffs into the lungs 2 (two) times daily.  . Cholecalciferol (VITAMIN D3) 1000 UNITS CAPS Take 1,000 Units by mouth every morning.   . citalopram (CELEXA) 10 MG tablet Take 10 mg by mouth every morning.   . clopidogrel (PLAVIX) 75 MG tablet Take 75 mg by mouth every morning.   [name] CREON 12000 units CPEP capsule Take 12,000 mg by mouth 3 (three) times daily.  [name] diltiazem (CARDIZEM CD) 240 MG 24 hr capsule Take 240 mg by mouth every morning.   . ezetimibe (ZETIA) 10 MG tablet Take 1 tablet (10 mg total) by mouth daily. (Patient taking differently: Take 10 mg by mouth every morning. )  . fluticasone (FLONASE) 50 MCG/ACT nasal spray Place 2 sprays into both nostrils as needed (nasal stuffiness).   . gabapentin (NEURONTIN) 300 MG capsule Take 300 mg by mouth at bedtime.  [name] guaiFENesin (MUCINEX) 600 MG 12 hr  tablet Take 600 mg by mouth 2 (two) times daily as needed for cough.   . hydrochlorothiazide (HYDRODIURIL) 25 MG tablet Take 25 mg by mouth every morning.   . hydrocortisone cream 1 % Apply 1 application topically daily as needed for itching.  [name] ibuprofen (ADVIL,MOTRIN) 200 MG tablet Take as needed for joint pain  . Iron-Vitamins (THEREMS H PO) Take 1 tablet by mouth daily.  [name] levothyroxine (  SYNTHROID, LEVOTHROID) 100 MCG tablet Take 100 mcg by mouth every morning.   . loratadine (CLARITIN) 10 MG tablet Take 10 mg by mouth daily as needed (drainage, drippy nose).   . metFORMIN (GLUCOPHAGE) 500 MG tablet Take 500 mg by mouth 2 (two) times daily with a meal.   . nitroGLYCERIN (NITRODUR - DOSED IN MG/24 HR) 0.6 mg/hr patch Apply patch every morning and remove at bedtime  . nitroGLYCERIN (NITROSTAT) 0.4 MG SL tablet Place 0.4 mg under the tongue every 5 (five) minutes as needed for chest pain.   . nizatidine (AXID) 150 MG capsule   . polyethylene glycol powder (GLYCOLAX/MIRALAX) powder Take 1 Container by mouth daily.   . polyvinyl alcohol (ARTIFICIAL TEARS) 1.4 % ophthalmic solution Place 1 drop into both eyes 3 (three) times daily as needed for dry eyes.  . SF 1.1 % GEL dental gel   . simethicone (GAS-X EXTRA STRENGTH) 125 MG chewable tablet Chew 1-2 tablets (125-250 mg total) by mouth 3 (three) times daily with meals. Max 4 tablets daily (Patient taking differently: Chew 125-250 mg by mouth 4 (four) times daily as needed (gas). Max 4 tablets daily)                 Objective:   Physical Exam  amb   wm  nad walks with cane    01/22/2015        174  > 04/23/2015  171 > 07/22/2015 169 > 12/20/2015  168 > 01/26/2017   165     12/25/14 174 lb (78.926 kg)  11/12/14 168 lb 1.9 oz (76.259 kg)  07/26/14 172 lb (78.019 kg)    Vital signs reviewed  - note sats 98% on RA on Arrival   HEENT: nl dentition, turbinates, and oropharynx. Nl external ear canals without cough reflex   NECK :  without  JVD/Nodes/TM/ nl carotid upstrokes bilaterally   LUNGS: no acc muscle use, slt barrel chest/ distant bs s wheeze bilaterally    CV:  RRR  no s3 or murmur or increase in P2, no edema   ABD:  soft and nontender with nl excursion in the supine position. No bruits or organomegaly, bowel sounds nl  MS:  warm without deformities, calf tenderness, cyanosis or clubbing  SKIN: warm and dry without lesions    NEURO:  alert, approp, no deficits noted.     CXR PA and Lateral:   01/26/2017 :    I personally reviewed images and agree with radiology impression as follows:    Cardiac shadow is within normal limits. Aortic calcifications are again seen. The lungs are well aerated bilaterally. No focal infiltrate or sizable effusion is seen. Degenerative changes of thoracic spine are noted. Prior healed right rib fractures are seen.       Assessment & Plan:

## 2017-01-27 NOTE — Assessment & Plan Note (Addendum)
pfts 06/29/14  FEV1   1.70 (72%) ratio 66 and DLCO 65 corrects to 99   - 01/22/2015  Walked RA x 3 laps @ 185 ft each stopped due to  End of study, nl pace with cane , no sob or desat     - 01/26/2017  After extensive coaching HFA effectiveness =    75% from a baseline of 50% : try bevespi 2bid   Pt is Group B in terms of symptom/risk and laba/lama therefore appropriate rx at this point if he learns to master mdi/hfa technique as previously preferred this over neb, which is unusual   Will ask him to return for full pfts and expand the w/u then to include doe protocol if not improving.  Discussed in detail all the  indications, usual  risks and alternatives  relative to the benefits with patient who agrees to proceed with rx/ f/u as outlined    I had an extended discussion with the patient reviewing all relevant studies completed to date and  lasting 15 to 20 minutes of a 25 minute visit    Each maintenance medication was reviewed in detail including most importantly the difference between maintenance and prns and under what circumstances the prns are to be triggered using an action plan format that is not reflected in the computer generated alphabetically organized AVS.    Please see AVS for specific instructions unique to this visit that I personally wrote and verbalized to the the pt in detail and then reviewed with pt  by my nurse highlighting any  changes in therapy recommended at today's visit to their plan of care.

## 2017-02-18 ENCOUNTER — Encounter: Payer: Self-pay | Admitting: Nurse Practitioner

## 2017-02-18 ENCOUNTER — Ambulatory Visit (INDEPENDENT_AMBULATORY_CARE_PROVIDER_SITE_OTHER): Payer: Medicare Other | Admitting: Nurse Practitioner

## 2017-02-18 VITALS — BP 146/72 | HR 53 | Temp 98.1°F | Resp 16 | Ht 68.0 in | Wt 168.0 lb

## 2017-02-18 DIAGNOSIS — J309 Allergic rhinitis, unspecified: Secondary | ICD-10-CM | POA: Insufficient documentation

## 2017-02-18 DIAGNOSIS — L609 Nail disorder, unspecified: Secondary | ICD-10-CM | POA: Diagnosis not present

## 2017-02-18 MED ORDER — LORATADINE 10 MG PO TABS: 10.0000 mg | ORAL_TABLET | Freq: Every day | ORAL | 9 refills | Status: DC | PRN

## 2017-02-18 MED ORDER — FLUTICASONE PROPIONATE 50 MCG/ACT NA SUSP: 2.0000 | NASAL | 3 refills | Status: DC | PRN

## 2017-02-18 NOTE — Assessment & Plan Note (Signed)
Symptoms consistent with allergic rhinitis. Discussed allergy triggers and use of daily antihistamines/glucocorticoid nasal spray for management. Hell try flonase and claritin daily for 2 weeks and let me know if symptoms persist.

## 2017-02-18 NOTE — Patient Instructions (Addendum)
I have sent a refill for your flonase to your pharmacy. I'd like for you to take 2 sprays per nostril once daily for 3 days, then 1 spray per nostril once daily for the next 2 weeks, then as needed.  I've also sent a refill for your claritin. I'd like for you to take 1 pill once daily for 2 weeks, then as needed.  Let me know if your congestion persists after trying the flonase and claritin.  I have placed a referral for dermatology. I'd like them to look at the spots on your fingernails. Our office will contact you to schedule this appointment.  .It was nice to meet you. Thanks for letting me take care of you today :)   Allergic Rhinitis Allergic rhinitis is when the mucous membranes in the nose respond to allergens. Allergens are particles in the air that cause your body to have an allergic reaction. This causes you to release allergic antibodies. Through a chain of events, these eventually cause you to release histamine into the blood stream. Although meant to protect the body, it is this release of histamine that causes your discomfort, such as frequent sneezing, congestion, and an itchy, runny nose. What are the causes? Seasonal allergic rhinitis (hay fever) is caused by pollen allergens that may come from grasses, trees, and weeds. Year-round allergic rhinitis (perennial allergic rhinitis) is caused by allergens such as house dust mites, pet dander, and mold spores. What are the signs or symptoms?  Nasal stuffiness (congestion).  Itchy, runny nose with sneezing and tearing of the eyes. How is this diagnosed? Your health care provider can help you determine the allergen or allergens that trigger your symptoms. If you and your health care provider are unable to determine the allergen, skin or blood testing may be used. Your health care provider will diagnose your condition after taking your health history and performing a physical exam. Your health care provider may assess you for other  related conditions, such as asthma, pink eye, or an ear infection. How is this treated? Allergic rhinitis does not have a cure, but it can be controlled by:  Medicines that block allergy symptoms. These may include allergy shots, nasal sprays, and oral antihistamines.  Avoiding the allergen.  Hay fever may often be treated with antihistamines in pill or nasal spray forms. Antihistamines block the effects of histamine. There are over-the-counter medicines that may help with nasal congestion and swelling around the eyes. Check with your health care provider before taking or giving this medicine. If avoiding the allergen or the medicine prescribed do not work, there are many new medicines your health care provider can prescribe. Stronger medicine may be used if initial measures are ineffective. Desensitizing injections can be used if medicine and avoidance does not work. Desensitization is when a patient is given ongoing shots until the body becomes less sensitive to the allergen. Make sure you follow up with your health care provider if problems continue. Follow these instructions at home: It is not possible to completely avoid allergens, but you can reduce your symptoms by taking steps to limit your exposure to them. It helps to know exactly what you are allergic to so that you can avoid your specific triggers. Contact a health care provider if:  You have a fever.  You develop a cough that does not stop easily (persistent).  You have shortness of breath.  You start wheezing.  Symptoms interfere with normal daily activities. This information is not intended to replace  advice given to you by your health care provider. Make sure you discuss any questions you have with your health care provider. Document Released: 01/06/2001 Document Revised: 12/13/2015 Document Reviewed: 12/19/2012 Elsevier Interactive Patient Education  2017 [employer]

## 2017-02-18 NOTE — Progress Notes (Signed)
Subjective:    Patient ID: Benjamin Mccall, male    DOB: 1929-04-29, 83 y.o.   MRN: [id number]  HPI Benjamin Mccall is a who presents today to establish care. He is transferring to me from another provider in the same clinic.  Congestion- He reports nasal and sinus congestion that began 8 months ago. The symptoms occur daily. The symptoms began after he moved to [place] from [place]. He reports watery eyes, sneezing, postnasal drip, hoarseness, dry cough. He denies malaise, fevers, ear pain or pressure, chest pain. He has tried flonase and claritin in the past but only used them for 1-2 days because he did not notice any difference in his symptoms.  Fingernail abdormnalities- He first noticed dark coloration to his left thumb and right middle finger several months ago. He did recall any injuries prior. He does not have any pain in the fingers. He has clipped his nails but the discoloration stays underneath the nails. He has a history of skin cancer and sun exposure. 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 Asthma   . Atrial fibrillation (HCC)   . Chest pain   . Diabetes mellitus without complication (HCC)   . GERD (gastroesophageal reflux disease)   . Hernia, hiatal   . Hyperlipidemia   . Hypertension   . Hypothyroid      Social History   Social History  . Marital status: Married    Spouse name: N/A  . Number of children: 2  . Years of education: 12   Occupational History  . 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 Smoking status: Former Smoker    Packs/day: 0.50    Years: 7.00    Types: Pipe, Cigars    Quit date: 04/27/1968  . Smokeless tobacco: Never Used  . Alcohol use No  . Drug use: No  . Sexual activity: Not on file   Other Topics Concern  . Not on file   Social History Narrative   Born and raised in [place]. Residing in [place] Living with his wife. Denies any beliefs effecting healthcare.     Past Surgical History:  Procedure Laterality Date  . APPENDECTOMY    . CATARACT EXTRACTION    . HIATAL HERNIA REPAIR    . PERCUTANEOUS CORONARY STENT INTERVENTION (PCI-S)      Family History  Problem Relation Age of Onset  . Hypertension Mother   . Stroke Mother   . Ovarian cancer Mother   . Stroke Father   . Diabetes Son     Allergies  Allergen Reactions  . Reglan [Metoclopramide]     Tremors   . Sulfa Antibiotics Other (See Comments)    shakes    Current Outpatient Prescriptions on File Prior to Visit  Medication Sig Dispense Refill  . acetaminophen (TYLENOL) 325 MG tablet Take 650 mg by mouth every 6 (six) hours as needed.    [name] albuterol (PROVENTIL HFA;VENTOLIN HFA) 108 (90 BASE) MCG/ACT inhaler Inhale 2 puffs into the lungs every 4 (four) hours as needed for wheezing or shortness of breath.     . ALPRAZolam (XANAX) 0.25 MG tablet Take 0.25 mg by mouth at bedtime as needed for anxiety.    [name] amoxicillin (AMOXIL) 500 MG capsule     .  aspirin 81 MG tablet Take 81 mg by mouth every morning.     [name]. atorvastatin (LIPITOR) 20 MG tablet Take 20 mg by mouth daily at 6 PM.     . Cholecalciferol (VITAMIN D3) 1000 UNITS CAPS Take 1,000 Units by mouth every morning.     . citalopram (CELEXA) 10 MG tablet Take 10 mg by mouth every morning.     . clopidogrel (PLAVIX) 75 MG tablet Take 75 mg by mouth every morning.     [name]. CREON 12000 units CPEP capsule Take 12,000 mg by mouth 3 (three) times daily.    [name]. diltiazem (CARDIZEM CD) 240 MG 24 hr capsule Take 240 mg by mouth every morning.     . ezetimibe (ZETIA) 10 MG tablet Take 1 tablet (10 mg total) by mouth daily. (Patient taking differently: Take 10 mg by mouth every morning. ) 30 tablet 0  . gabapentin (NEURONTIN) 300 MG capsule Take 300 mg by mouth at bedtime.    [name]. guaiFENesin (MUCINEX) 600 MG 12 hr tablet Take 600 mg by mouth 2 (two)  times daily as needed for cough.     . hydrochlorothiazide (HYDRODIURIL) 25 MG tablet Take 25 mg by mouth every morning.     . hydrocortisone cream 1 % Apply 1 application topically daily as needed for itching.    [name]. ibuprofen (ADVIL,MOTRIN) 200 MG tablet Take as needed for joint pain    . Iron-Vitamins (THEREMS H PO) Take 1 tablet by mouth daily.    [name]. levothyroxine (SYNTHROID, LEVOTHROID) 100 MCG tablet Take 100 mcg by mouth every morning.     . metFORMIN (GLUCOPHAGE) 500 MG tablet Take 500 mg by mouth 2 (two) times daily with a meal.     . nitroGLYCERIN (NITRODUR - DOSED IN MG/24 HR) 0.6 mg/hr patch Apply patch every morning and remove at bedtime    . nitroGLYCERIN (NITROSTAT) 0.4 MG SL tablet Place 0.4 mg under the tongue every 5 (five) minutes as needed for chest pain.     . polyethylene glycol powder (GLYCOLAX/MIRALAX) powder Take 1 Container by mouth daily.     . polyvinyl alcohol (ARTIFICIAL TEARS) 1.4 % ophthalmic solution Place 1 drop into both eyes 3 (three) times daily as needed for dry eyes.    . SF 1.1 % GEL dental gel     . simethicone (GAS-X EXTRA STRENGTH) 125 MG chewable tablet Chew 1-2 tablets (125-250 mg total) by mouth 3 (three) times daily with meals. Max 4 tablets daily (Patient taking differently: Chew 125-250 mg by mouth 4 (four) times daily as needed (gas). Max 4 tablets daily) 30 tablet 0   No current facility-administered medications on file prior to visit.     BP (!) 146/72 (BP Location: Left Arm, Patient Position: Sitting, Cuff Size: Normal)   Pulse (!) 53   Temp 98.1 F (36.7 C) (Oral)   Resp 16   Ht 5\' 8"  (1.727 m)   Wt 168 lb (76.2 kg)   SpO2 97%   BMI 25.54 kg/m       Objective:   Physical Exam  Constitutional: He is oriented to person, place, and time. He appears well-developed and well-nourished.  HENT:  Head: Normocephalic and atraumatic.  Right Ear: External ear and ear canal normal. No tenderness. Tympanic membrane is retracted.  Left Ear:  External ear and ear canal normal. No tenderness.  Nose: Nose normal.  Mouth/Throat: Uvula is midline. Posterior oropharyngeal erythema present. No oropharyngeal exudate or posterior oropharyngeal edema.  Cloudy  TM bilaterally.  Cardiovascular: Normal rate, regular rhythm and intact distal pulses.   Pulmonary/Chest: Effort normal and breath sounds normal. No respiratory distress. He has no wheezes.  Neurological: He is alert and oriented to person, place, and time.  Skin: Skin is warm and dry. No rash noted. No erythema.  Bilateral hands with dry, peeling skin. Fingernails are thickened, yellowed and brittle. Left thumb and right middle finger with blackish blue areas to fingernails.  Psychiatric: He has a normal mood and affect. Judgment and thought content normal.          Assessment & Plan:  Fingernail abnormalities: suspicious for melanoma. Patient at high risk-history of skin cancer and sun exposure. Referral to dermatology placed.  He will schedule annual Wellness with wellness nurse next month and follow up with me as needed.

## 2017-02-23 ENCOUNTER — Encounter: Payer: Self-pay | Admitting: Internal Medicine

## 2017-02-23 ENCOUNTER — Ambulatory Visit: Payer: Medicare Other

## 2017-02-23 ENCOUNTER — Ambulatory Visit (INDEPENDENT_AMBULATORY_CARE_PROVIDER_SITE_OTHER): Payer: Medicare Other | Admitting: Internal Medicine

## 2017-02-23 VITALS — BP 128/68 | HR 55 | Ht 68.0 in | Wt 168.0 lb

## 2017-02-23 DIAGNOSIS — J449 Chronic obstructive pulmonary disease, unspecified: Secondary | ICD-10-CM

## 2017-02-23 LAB — PULMONARY FUNCTION TEST
DL/VA % pred: 88 %
DL/VA: 3.85 ml/min/mmHg/L
DLCO UNC: 16.03 ml/min/mmHg
DLCO unc % pred: 58 %
FEF 25-75 Post: 0.74 L/sec
FEF 25-75 Pre: 0.75 L/sec
FEF2575-%Change-Post: -2 %
FEF2575-%Pred-Post: 52 %
FEF2575-%Pred-Pre: 53 %
FEV1-%Change-Post: 0 %
FEV1-%PRED-POST: 66 %
FEV1-%Pred-Pre: 66 %
FEV1-POST: 1.48 L
FEV1-Pre: 1.48 L
FEV1FVC-%Change-Post: 3 %
FEV1FVC-%PRED-PRE: 92 %
FEV6-%Change-Post: -1 %
FEV6-%Pred-Post: 72 %
FEV6-%Pred-Pre: 73 %
FEV6-POST: 2.17 L
FEV6-Pre: 2.2 L
FEV6FVC-%Change-Post: 2 %
FEV6FVC-%PRED-POST: 108 %
FEV6FVC-%Pred-Pre: 105 %
FVC-%Change-Post: -3 %
FVC-%PRED-PRE: 69 %
FVC-%Pred-Post: 67 %
FVC-POST: 2.18 L
FVC-PRE: 2.26 L
Post FEV1/FVC ratio: 68 %
Post FEV6/FVC ratio: 99 %
Pre FEV1/FVC ratio: 65 %
Pre FEV6/FVC Ratio: 97 %
RV % pred: 112 %
RV: 2.89 L
TLC % PRED: 81 %
TLC: 5.18 L

## 2017-02-23 MED ORDER — GLYCOPYRROLATE-FORMOTEROL 9-4.8 MCG/ACT IN AERO: 1.0000 | INHALATION_SPRAY | Freq: Two times a day (BID) | RESPIRATORY_TRACT | 0 refills | Status: DC

## 2017-02-23 NOTE — Progress Notes (Signed)
Subjective:   Patient ID: Benjamin Mccall, male    DOB: [birth year]    MRN: [id number]    Brief patient profile:  89  yowm quit smoking at dx of sarcoid at age 83 dx in [place] by surgeon with neck node with cough/sob rx prednisone x at least a decade with chronic doe ever since then x yardwork and leveled off d/c prednisone around age 83 referred to pulmonary 03/27/2014  by [facility] primary care with doe s  Cough  With GOLD II copd/ symptoms improved off ACEi    History of Present Illness  03/27/2014 1st [facility] office visit/ Benjamin Mccall   Chief Complaint  Patient presents with  . Pulmonary Consult    Referred by Benjamin EkeGreg Calone, NP. Pt states had lung bx age 83 and was dxed with Sarcoid. [place]. He c/o SOB that comes and goes- sometimes gets out of breath when walking the dog and exposed to cold weather.   doe is chronic and more related to how heavy he exerts   Maybe better with symbicort ? Makes him hoarse  Has combivent but doesn't think it helps/ doesn't use it rec Try symbicort 160 one twice daily to see if you don't do just as well     04/23/2015  f/u ov/Benjamin Mccall re: copd II/ on acei on symbicort 160 one bid  Chief Complaint  Patient presents with  . Follow-up    Pt states his breathing is overall doing well. He has good days and bad. He is using albuterol once per wk on average.   has 2 patterns of doe > one reproducible with walking too fast on his 3 x daily walk with dogs/ esp if > nl  Pace or up hills = MMRC1 = can walk nl pace, flat grade, can't hurry or go uphills or steps s sob   The other is unpredictable and not related to walking / can occur at rest/ assoc with dry cough and hoarseness  rec Stop lisinopril and I will let Dr Benjamin Mccall know why it may be working against you Keep up your regular walking schedule so you can let me know if you are losing any ground between visits   05/20/15 NP ov rec Change Symbicort to Budesonide and Brovana  Neb Twice daily  > did not feel it worked as well      12/20/2015  f/u ov/Benjamin Mccall re: GOLD II copd/ maint rx symb 160 2bid very rare saba  Chief Complaint  Patient presents with  . Follow-up    Breathing overall doing well. He states he has only used rescue inhaler x 3 since the last visit.   as long as it is cool can make it up the hill at [place] and [place] stopping using cane and no 02 / [facility] No noct symtpoms/ throat ok  rec No change in medications  Only use your albuterol as a rescue medication     01/26/2017  f/u ov/Benjamin Mccall re: re-establish   COPD GOLD II on symb 160 2bid with poor baseline hfa  Chief Complaint  Patient presents with  . Acute Visit    Pt c/o increased DOE x 2 wks- gets winded walking 1/4 mile. He is using his proair inhaler 1 x per day on average.    has not used proair and then rechallenged prior to exertion  No noct symptoms unless has to get up to urinate  then sob back to bed  Doe = MMRC2 = can't walk a nl pace on a flat grade s sob but does fine slow and flat  rec Bevespi Take 2 puffs first thing in am and then another 2 puffs about 12 hours later.  Only use your albuterol as a rescue medication    02/23/2017  f/u ov/Benjamin Mccall re:  GOLD II copd / maint bevespi,   Chief Complaint  Patient presents with  . Follow-up    Pt has PFT prior to visit today. Pt still having SOB with exertion with coughing and some wheezing. Pt is currently having sinus troubles.   much better vs prior to bevespi able to walk down to [address] and back from [place] where prev stopped multiple times on incline.  Very rare saba need, mostly when real hot/ humid   No obvious day to day or daytime variability or assoc excess/ purulent sputum or mucus plugs or hemoptysis or cp or chest tightness, subjective wheeze or overt sinus or hb symptoms. No unusual exp hx or h/o childhood pna/ asthma or knowledge of premature birth.  Sleeping ok flat without nocturnal   or early am exacerbation  of respiratory  c/o's or need for noct saba. Also denies any obvious fluctuation of symptoms with weather or environmental changes or other aggravating or alleviating factors except as outlined above   Current Allergies, Complete Past Medical History, Past Surgical History, Family History, and Social History were reviewed in [employer] record.  ROS  The following are not active complaints unless bolded Hoarseness, sore throat, dysphagia, dental problems, itching, sneezing,  nasal congestion or discharge of excess mucus or purulent secretions, ear ache,   fever, chills, sweats, unintended wt loss or wt gain, classically pleuritic or exertional cp,  orthopnea pnd or leg swelling, presyncope, palpitations, abdominal pain, anorexia, nausea, vomiting, diarrhea  or change in bowel habits or change in bladder habits, change in stools or change in urine, dysuria, hematuria,  rash, arthralgias, visual complaints, headache, numbness, weakness or ataxia or problems with walking or coordination,  change in mood/affect or memory.        Current Meds  Medication Sig  . acetaminophen (TYLENOL) 325 MG tablet Take 650 mg by mouth every 6 (six) hours as needed.  [name] albuterol (PROVENTIL HFA;VENTOLIN HFA) 108 (90 BASE) MCG/ACT inhaler Inhale 2 puffs into the lungs every 4 (four) hours as needed for wheezing or shortness of breath.   . ALPRAZolam (XANAX) 0.25 MG tablet Take 0.25 mg by mouth at bedtime as needed for anxiety.  [name] amoxicillin (AMOXIL) 500 MG capsule   . aspirin 81 MG tablet Take 81 mg by mouth every morning.   [name] atorvastatin (LIPITOR) 20 MG tablet Take 20 mg by mouth daily at 6 PM.   . Cholecalciferol (VITAMIN D3) 1000 UNITS CAPS Take 1,000 Units by mouth every morning.   . citalopram (CELEXA) 10 MG tablet Take 10 mg by mouth every morning.   . clopidogrel (PLAVIX) 75 MG tablet Take 75 mg by mouth every morning.   [name] CREON 12000 units CPEP capsule Take  12,000 mg by mouth 3 (three) times daily.  [name] diltiazem (CARDIZEM CD) 240 MG 24 hr capsule Take 240 mg by mouth every morning.   . ezetimibe (ZETIA) 10 MG tablet Take 1 tablet (10 mg total) by mouth daily. (Patient taking differently: Take 10 mg by mouth every morning. )  . fluticasone (FLONASE) 50 MCG/ACT nasal spray Place 2 sprays  into both nostrils as needed (nasal stuffiness).  . gabapentin (NEURONTIN) 300 MG capsule Take 300 mg by mouth at bedtime.  [name] guaiFENesin (MUCINEX) 600 MG 12 hr tablet Take 600 mg by mouth 2 (two) times daily as needed for cough.   . hydrochlorothiazide (HYDRODIURIL) 25 MG tablet Take 25 mg by mouth every morning.   . hydrocortisone cream 1 % Apply 1 application topically daily as needed for itching.  [name] ibuprofen (ADVIL,MOTRIN) 200 MG tablet Take as needed for joint pain  . Iron-Vitamins (THEREMS H PO) Take 1 tablet by mouth daily.  [name] levothyroxine (SYNTHROID, LEVOTHROID) 100 MCG tablet Take 100 mcg by mouth every morning.   . loratadine (CLARITIN) 10 MG tablet Take 1 tablet (10 mg total) by mouth daily as needed (drainage, drippy nose).  . metFORMIN (GLUCOPHAGE) 500 MG tablet Take 500 mg by mouth 2 (two) times daily with a meal.   . nitroGLYCERIN (NITRODUR - DOSED IN MG/24 HR) 0.6 mg/hr patch Apply patch every morning and remove at bedtime  . nitroGLYCERIN (NITROSTAT) 0.4 MG SL tablet Place 0.4 mg under the tongue every 5 (five) minutes as needed for chest pain.   . polyethylene glycol powder (GLYCOLAX/MIRALAX) powder Take 1 Container by mouth daily.   . polyvinyl alcohol (ARTIFICIAL TEARS) 1.4 % ophthalmic solution Place 1 drop into both eyes 3 (three) times daily as needed for dry eyes.  . SF 1.1 % GEL dental gel   . simethicone (GAS-X EXTRA STRENGTH) 125 MG chewable tablet Chew 1-2 tablets (125-250 mg total) by mouth 3 (three) times daily with meals. Max 4 tablets daily (Patient taking differently: Chew 125-250 mg by mouth 4 (four) times daily as needed (gas). Max 4  tablets daily)                      Objective:   Physical Exam  amb   wm  nad walks with cane    01/22/2015        174  > 04/23/2015  171 > 07/22/2015 169 > 12/20/2015  168 > 01/26/2017   165 > 02/23/2017  168     12/25/14 174 lb (78.926 kg)  11/12/14 168 lb 1.9 oz (76.259 kg)  07/26/14 172 lb (78.019 kg)    Vital signs reviewed  - - Note on arrival 02 sats  96% on RA    HEENT: nl dentition, turbinates, and oropharynx. Nl external ear canals without cough reflex   NECK :  without JVD/Nodes/TM/ nl carotid upstrokes bilaterally   LUNGS: no acc muscle use, slt barrel chest/ distant bs s wheeze bilaterally    CV:  RRR  no s3 or murmur or increase in P2, no edema   ABD:  soft and nontender with nl excursion in the supine position. No bruits or organomegaly, bowel sounds nl  MS:  warm without deformities, calf tenderness, cyanosis or clubbing  SKIN: warm and dry without lesions    NEURO:  alert, approp, no deficits noted.     CXR PA and Lateral:   01/26/2017 :    I personally reviewed images and agree with radiology impression as follows:    Cardiac shadow is within normal limits. Aortic calcifications are again seen. The lungs are well aerated bilaterally. No focal infiltrate or sizable effusion is seen. Degenerative changes of thoracic spine are noted. Prior healed right rib fractures are seen.       Assessment & Plan:

## 2017-02-23 NOTE — Patient Instructions (Signed)
No change in medications   If not happy with your medications for any reason we need to you back here right away    If you are satisfied with your treatment plan,  let your doctor know and he/she can either refill your medications or you can return here when your prescription runs out.     If in any way you are not 100% satisfied,  please tell us.  If 100% better, tell your friends!  Pulmonary follow up is as needed

## 2017-02-23 NOTE — Progress Notes (Deleted)
Patient ID: MAXAMUS COLAO, male   DOB: 1929/05/11, 83 y.o.   MRN: [id number]     83 y.o.  diabetic history of HTN and chronic sarcoid seen by Dr Sherene Sires   Distant history of CAD with stents more then 11 years ago Had angina at that time and none recently.  ? PAF but in SR now and not on anticoagulation.  He still drives and gets around well outside of orthopedic issues. Mild exertional dyspnea no chest pain palpitations , syncope or TIA's.  Compliant with meds Lives at [facility] with wife of 50 years although she has recently been at [facility] for rehab from stroke.  Son with him today and seems invested in his care.  Mild chronic LE edema on diuretic   Echo 04/25/14 reviewed  Study Conclusions  - Left ventricle: Wall thickness was increased in a pattern of mild LVH. There was mild focal basal hypertrophy of the septum. Systolic function was normal. The estimated ejection fraction was in the range of 55% to 60%. Wall motion was normal; there were no regional wall motion abnormalities. Doppler parameters are consistent with abnormal left ventricular relaxation (grade 1 diastolic dysfunction). - Left atrium: The atrium was mildly dilated.  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  All other systems reviewed and negative   General: There were no vitals taken for this visit. Affect appropriate Healthy:  appears stated age HEENT: normal Neck supple with no adenopathy JVP normal no bruits no thyromegaly Lungs clear with no wheezing and good diaphragmatic motion Heart:  S1/S2 SEM  murmur, no rub, gallop or click PMI normal Abdomen: benighn, BS positve, no  tenderness, no AAA no bruit.  No HSM or HJR Distal pulses intact with no bruits No edema Neuro non-focal Skin warm and dry No muscular weakness    Medications Current Outpatient Prescriptions  Medication Sig Dispense Refill  . acetaminophen (TYLENOL) 325 MG tablet Take 650 mg by mouth every 6 (six) hours as needed.    [name] albuterol (PROVENTIL HFA;VENTOLIN HFA) 108 (90 BASE) MCG/ACT inhaler Inhale 2 puffs into the lungs every 4 (four) hours as needed for wheezing or shortness of breath.     . ALPRAZolam (XANAX) 0.25 MG tablet Take 0.25 mg by mouth at bedtime as needed for anxiety.    [name] amoxicillin (AMOXIL) 500 MG capsule     . aspirin 81 MG tablet Take 81 mg by mouth every morning.     [name] atorvastatin (LIPITOR) 20 MG tablet Take 20 mg by mouth daily at 6 PM.     . Cholecalciferol (VITAMIN D3) 1000 UNITS CAPS Take 1,000 Units by mouth every morning.     . citalopram (CELEXA) 10 MG tablet Take 10 mg by mouth every morning.     . clopidogrel (PLAVIX) 75 MG tablet Take 75 mg by mouth every morning.     [name] CREON 12000 units CPEP capsule Take 12,000 mg by mouth 3 (three) times daily.    [name] diltiazem (CARDIZEM CD) 240 MG 24 hr capsule Take 240 mg by mouth every morning.     . ezetimibe (ZETIA) 10 MG tablet Take 1 tablet (10 mg total) by mouth daily. (Patient taking differently: Take 10 mg by mouth  every morning. ) 30 tablet 0  . fluticasone (FLONASE) 50 MCG/ACT nasal spray Place 2 sprays into both nostrils as needed (nasal stuffiness). 16 g 3  . gabapentin (NEURONTIN) 300 MG capsule Take 300 mg by mouth at bedtime.    [name]. guaiFENesin (MUCINEX) 600 MG 12 hr tablet Take 600 mg by mouth 2 (two) times daily as needed for cough.     . hydrochlorothiazide (HYDRODIURIL) 25 MG tablet Take 25 mg by mouth every morning.     . hydrocortisone cream 1 % Apply 1 application topically daily as needed for itching.    [name]. ibuprofen (ADVIL,MOTRIN) 200 MG tablet Take as needed for joint pain    . Iron-Vitamins (THEREMS H  PO) Take 1 tablet by mouth daily.    [name]. levothyroxine (SYNTHROID, LEVOTHROID) 100 MCG tablet Take 100 mcg by mouth every morning.     . loratadine (CLARITIN) 10 MG tablet Take 1 tablet (10 mg total) by mouth daily as needed (drainage, drippy nose). 30 tablet 9  . metFORMIN (GLUCOPHAGE) 500 MG tablet Take 500 mg by mouth 2 (two) times daily with a meal.     . nitroGLYCERIN (NITRODUR - DOSED IN MG/24 HR) 0.6 mg/hr patch Apply patch every morning and remove at bedtime    . nitroGLYCERIN (NITROSTAT) 0.4 MG SL tablet Place 0.4 mg under the tongue every 5 (five) minutes as needed for chest pain.     . polyethylene glycol powder (GLYCOLAX/MIRALAX) powder Take 1 Container by mouth daily.     . polyvinyl alcohol (ARTIFICIAL TEARS) 1.4 % ophthalmic solution Place 1 drop into both eyes 3 (three) times daily as needed for dry eyes.    . SF 1.1 % GEL dental gel     . simethicone (GAS-X EXTRA STRENGTH) 125 MG chewable tablet Chew 1-2 tablets (125-250 mg total) by mouth 3 (three) times daily with meals. Max 4 tablets daily (Patient taking differently: Chew 125-250 mg by mouth 4 (four) times daily as needed (gas). Max 4 tablets daily) 30 tablet 0   No current facility-administered medications for this visit.     Allergies Reglan [metoclopramide] and Sulfa antibiotics  Family History: Family History  Problem Relation Age of Onset  . Hypertension Mother   . Stroke Mother   . Ovarian cancer Mother   . Stroke Father   . Diabetes Son     Social History: Social History   Social History  . Marital status: Married    Spouse name: N/A  . Number of children: 2  . Years of education: 12   Occupational History  . 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 Smoking status: Former Smoker    Packs/day: 0.50    Years: 7.00    Types: Pipe, Cigars    Quit date: 04/27/1968  . Smokeless tobacco: Never Used  . Alcohol use No  . Drug use: No  . Sexual activity: Not  on file   Other Topics Concern  . Not on file   Social History Narrative   [place] Living with his wife. Denies any beliefs effecting healthcare.     Past Surgical History:  Procedure Laterality Date  . APPENDECTOMY    . CATARACT EXTRACTION    . HIATAL HERNIA REPAIR    . PERCUTANEOUS CORONARY STENT INTERVENTION (PCI-S)      Past Medical History:  Diagnosis Date  .  Asthma   . Atrial fibrillation (HCC)   . Chest pain   . Diabetes mellitus without complication (HCC)   . GERD (gastroesophageal reflux disease)   . Hernia, hiatal   . Hyperlipidemia   . Hypertension   . Hypothyroid     Electrocardiogram:  09/07/14   SR rate 51  LAD PR 230  LVH  12/04/15 SR rate 57 PR 220 LAFB LVH   Assessment and Plan CAD" Stable with no angina and good activity level.  Continue medical Rx  PAF: maint NSR with no palpitations   HTN:  Well controlled.  Continue current medications and low sodium Dash type diet.    Sarcoid:  Mild wheezing has inhaler f/u primary /pulmonary  Dyspnea  Related to asthma / sarcoid EF normal no edema   First Degree Block :  F/u ECG in a year no high grade AV block  Umbilical Hernia: stable small non tender and reducable  Veins: varicose encouraged compression stockings no incidence of phlebitis   Charlton Haws

## 2017-02-24 ENCOUNTER — Emergency Department (HOSPITAL_COMMUNITY)
Admission: EM | Admit: 2017-02-24 | Discharge: 2017-02-24 | Disposition: A | Discharge status: Home / Self Care | Payer: Medicare Other | Attending: Emergency Medicine | Admitting: Emergency Medicine

## 2017-02-24 ENCOUNTER — Ambulatory Visit: Payer: Medicare Other | Admitting: Cardiovascular Disease

## 2017-02-24 ENCOUNTER — Encounter (HOSPITAL_COMMUNITY): Payer: Self-pay | Admitting: Emergency Medicine

## 2017-02-24 ENCOUNTER — Emergency Department (HOSPITAL_COMMUNITY): Payer: Medicare Other

## 2017-02-24 DIAGNOSIS — J45909 Unspecified asthma, uncomplicated: Secondary | ICD-10-CM | POA: Insufficient documentation

## 2017-02-24 DIAGNOSIS — J449 Chronic obstructive pulmonary disease, unspecified: Secondary | ICD-10-CM | POA: Insufficient documentation

## 2017-02-24 DIAGNOSIS — R51 Headache: Secondary | ICD-10-CM | POA: Insufficient documentation

## 2017-02-24 DIAGNOSIS — Z7982 Long term (current) use of aspirin: Secondary | ICD-10-CM | POA: Insufficient documentation

## 2017-02-24 DIAGNOSIS — I10 Essential (primary) hypertension: Secondary | ICD-10-CM | POA: Diagnosis not present

## 2017-02-24 DIAGNOSIS — Z7984 Long term (current) use of oral hypoglycemic drugs: Secondary | ICD-10-CM | POA: Insufficient documentation

## 2017-02-24 DIAGNOSIS — E039 Hypothyroidism, unspecified: Secondary | ICD-10-CM | POA: Diagnosis not present

## 2017-02-24 DIAGNOSIS — Z87891 Personal history of nicotine dependence: Secondary | ICD-10-CM | POA: Diagnosis not present

## 2017-02-24 DIAGNOSIS — E119 Type 2 diabetes mellitus without complications: Secondary | ICD-10-CM | POA: Diagnosis not present

## 2017-02-24 DIAGNOSIS — W19XXXA Unspecified fall, initial encounter: Secondary | ICD-10-CM

## 2017-02-24 DIAGNOSIS — I2510 Atherosclerotic heart disease of native coronary artery without angina pectoris: Secondary | ICD-10-CM | POA: Diagnosis not present

## 2017-02-24 DIAGNOSIS — Z7902 Long term (current) use of antithrombotics/antiplatelets: Secondary | ICD-10-CM | POA: Diagnosis not present

## 2017-02-24 DIAGNOSIS — Z79899 Other long term (current) drug therapy: Secondary | ICD-10-CM | POA: Insufficient documentation

## 2017-02-24 DIAGNOSIS — S161XXA Strain of muscle, fascia and tendon at neck level, initial encounter: Secondary | ICD-10-CM

## 2017-02-24 DIAGNOSIS — M542 Cervicalgia: Secondary | ICD-10-CM | POA: Diagnosis not present

## 2017-02-24 DIAGNOSIS — S0083XA Contusion of other part of head, initial encounter: Secondary | ICD-10-CM

## 2017-02-24 NOTE — Assessment & Plan Note (Signed)
pfts 06/29/14  FEV1   1.70 (72%) ratio 66 and DLCO 65 corrects to 99   - 01/22/2015  Walked RA x 3 laps @ 185 ft each stopped due to  End of study, nl pace with cane , no sob or desat    - 01/26/2017  After extensive coaching HFA effectiveness =    75% from a baseline of 50% : try bevespi 2bid   - PFT's  02/23/2017  FEV1 1.48 (66 % ) ratio 68  p 0 % improvement from saba p bevespi prior to study with DLCO  58 % corrects to 88  % for alv volume    Pt is Group B in terms of symptom/risk and laba/lama therefore appropriate rx at this point = bevespi  Formulary restrictions will be an ongoing challenge for the forseable future and I would be happy to pick an alternative if the pt will first  provide me a list of them but pt  will need to return here for training for any new device that is required eg dpi vs hfa vs respimat.    In meantime we can always provide samples so the patient never runs out of any needed respiratory medications.   Each maintenance medication was reviewed in detail including most importantly the difference between maintenance and as needed and under what circumstances the prns are to be used.  Please see AVS for specific  Instructions which are unique to this visit and I personally typed out  which were reviewed in detail in writing with the patient and a copy provided.

## 2017-02-24 NOTE — Discharge Instructions (Signed)
Take tylenol as needed for discomfort. Cool compresses to the sore areas. Follow up with your doctor for recheck in 2-3 days.

## 2017-02-24 NOTE — ED Provider Notes (Signed)
[facility] EMERGENCY DEPARTMENT Provider Note   CSN: [id number] Arrival date & time: 02/24/17  0000     History   Chief Complaint Chief Complaint  Patient presents with  . Fall    HPI Benjamin Mccall is a 83 y.o. male.  Patient is an 83 yo male with history of a-fib, asthma, DM, GERD, HLD, HTN, hypothyroid who presents for evaluation after all earlier this evening. He was asleep in a chair and slipped out onto the floor, hitting his head on a piece of furniture. Wife did not witness fall but heard him and was there within a minute. No altered mental status or confusion, per wife. No nausea or vomiting. He denies other injury   The history is provided by the patient. No language interpreter was used.    Past Medical History:  Diagnosis Date  . Asthma   . Atrial fibrillation (HCC)   . Chest pain   . Diabetes mellitus without complication (HCC)   . GERD (gastroesophageal reflux disease)   . Hernia, hiatal   . Hyperlipidemia   . Hypertension   . Hypothyroid     Patient Active Problem List   Diagnosis Date Noted  . Allergic rhinitis 02/18/2017  . Acute right-sided thoracic back pain 08/17/2016  . Medicare annual wellness visit, subsequent 03/24/2016  . Skin lesion of neck 03/24/2016  . Bloating 07/25/2015  . Occasional tremors 07/25/2015  . Abdominal distention 02/18/2015  . Upper airway cough syndrome 12/25/2014  . Acute upper respiratory infection 07/26/2014  . COPD GOLD II  07/01/2014  . Essential hypertension 04/03/2014  . Rash and nonspecific skin eruption 04/03/2014  . Sarcoidosis 03/13/2014  . Hyperlipidemia 03/13/2014  . Controlled type 2 diabetes mellitus (HCC) 03/13/2014  . Coronary artery disease 03/13/2014  . Atrial fibrillation (HCC) 03/13/2014  . Hypothyroidism 03/13/2014    Past Surgical History:  Procedure Laterality Date  . APPENDECTOMY    . CATARACT EXTRACTION    . HIATAL HERNIA REPAIR    . PERCUTANEOUS CORONARY STENT  INTERVENTION (PCI-S)         Home Medications    Prior to Admission medications   Medication Sig Start Date End Date Taking? Authorizing Provider  acetaminophen (TYLENOL) 325 MG tablet Take 650 mg by mouth every 6 (six) hours as needed.    [provider]  albuterol (PROVENTIL HFA;VENTOLIN HFA) 108 (90 BASE) MCG/ACT inhaler Inhale 2 puffs into the lungs every 4 (four) hours as needed for wheezing or shortness of breath.     [provider]  ALPRAZolam Prudy Feeler) 0.25 MG tablet Take 0.25 mg by mouth at bedtime as needed for anxiety.    [provider]  amoxicillin (AMOXIL) 500 MG capsule  12/10/16   [provider]  aspirin 81 MG tablet Take 81 mg by mouth every morning.     [provider]  atorvastatin (LIPITOR) 20 MG tablet Take 20 mg by mouth daily at 6 PM.     [provider]  Cholecalciferol (VITAMIN D3) 1000 UNITS CAPS Take 1,000 Units by mouth every morning.     [provider]  citalopram (CELEXA) 10 MG tablet Take 10 mg by mouth every morning.     [provider]  clopidogrel (PLAVIX) 75 MG tablet Take 75 mg by mouth every morning.     [provider]  CREON 12000 units CPEP capsule Take 12,000 mg by mouth 3 (three) times daily. 11/19/15   [provider]  diltiazem (CARDIZEM  CD) 240 MG 24 hr capsule Take 240 mg by mouth every morning.     [provider]  ezetimibe (ZETIA) 10 MG tablet Take 1 tablet (10 mg total) by mouth daily. Patient taking differently: Take 10 mg by mouth every morning.  02/18/15   Veryl Speak, FNP  fluticasone (FLONASE) 50 MCG/ACT nasal spray Place 2 sprays into both nostrils as needed (nasal stuffiness). 02/18/17   Evaristo Bury, NP  gabapentin (NEURONTIN) 300 MG capsule Take 300 mg by mouth at bedtime.    [provider]  Glycopyrrolate-Formoterol (BEVESPI AEROSPHERE) 9-4.8 MCG/ACT AERO Inhale 1 puff into the lungs 2 (two) times daily. 02/23/17    Nyoka Cowden, MD  guaiFENesin (MUCINEX) 600 MG 12 hr tablet Take 600 mg by mouth 2 (two) times daily as needed for cough.     [provider]  hydrochlorothiazide (HYDRODIURIL) 25 MG tablet Take 25 mg by mouth every morning.     [provider]  hydrocortisone cream 1 % Apply 1 application topically daily as needed for itching.    [provider]  ibuprofen (ADVIL,MOTRIN) 200 MG tablet Take as needed for joint pain    [provider]  Iron-Vitamins (THEREMS H PO) Take 1 tablet by mouth daily.    [provider]  levothyroxine (SYNTHROID, LEVOTHROID) 100 MCG tablet Take 100 mcg by mouth every morning.     [provider]  loratadine (CLARITIN) 10 MG tablet Take 1 tablet (10 mg total) by mouth daily as needed (drainage, drippy nose). 02/18/17   Evaristo Bury, NP  metFORMIN (GLUCOPHAGE) 500 MG tablet Take 500 mg by mouth 2 (two) times daily with a meal.     [provider]  nitroGLYCERIN (NITRODUR - DOSED IN MG/24 HR) 0.6 mg/hr patch Apply patch every morning and remove at bedtime    [provider]  nitroGLYCERIN (NITROSTAT) 0.4 MG SL tablet Place 0.4 mg under the tongue every 5 (five) minutes as needed for chest pain.     [provider]  polyethylene glycol powder (GLYCOLAX/MIRALAX) powder Take 1 Container by mouth daily.     [provider]  polyvinyl alcohol (ARTIFICIAL TEARS) 1.4 % ophthalmic solution Place 1 drop into both eyes 3 (three) times daily as needed for dry eyes.    [provider]  SF 1.1 % GEL dental gel  12/10/16   [provider]  simethicone (GAS-X EXTRA STRENGTH) 125 MG chewable tablet Chew 1-2 tablets (125-250 mg total) by mouth 3 (three) times daily with meals. Max 4 tablets daily Patient taking differently: Chew 125-250 mg by mouth 4 (four) times daily as needed (gas). Max 4 tablets daily 02/18/15   Veryl Speak, FNP    Family History Family History    Problem Relation Age of Onset  . Hypertension Mother   . Stroke Mother   . Ovarian cancer Mother   . Stroke Father   . Diabetes Son     Social History Social History  Substance Use Topics  . Smoking status: Former Smoker    Packs/day: 0.50    Years: 7.00    Types: Pipe, Cigars    Quit date: 04/27/1968  . Smokeless tobacco: Never Used  . Alcohol use No     Allergies   Reglan [metoclopramide] and Sulfa antibiotics   Review of Systems Review of Systems  Constitutional: Negative for chills and fever.  Eyes: Negative for visual disturbance.  Respiratory: Negative.  Negative for shortness of breath.  Cardiovascular: Negative.  Negative for chest pain.  Gastrointestinal: Negative.  Negative for nausea and vomiting.  Musculoskeletal: Positive for neck pain. Negative for back pain.  Skin: Negative.   Neurological: Positive for headaches. Negative for facial asymmetry, speech difficulty and weakness.  Psychiatric/Behavioral: Negative for confusion.     Physical Exam Updated Vital Signs BP (!) 181/75 (BP Location: Left Arm)   Pulse (!) 47   Temp 98.1 F (36.7 C) (Oral)   Resp 17   SpO2 99%   Physical Exam  Constitutional: He is oriented to person, place, and time. He appears well-developed and well-nourished.  HENT:  Head: Normocephalic.  Bruising to right forehead with small hematoma.  Eyes: Pupils are equal, round, and reactive to light.  Neck: Normal range of motion. Neck supple.  Cardiovascular: Normal rate and regular rhythm.   Pulmonary/Chest: Effort normal and breath sounds normal.  Abdominal: Soft. Bowel sounds are normal. There is no tenderness. There is no rebound and no guarding.  Musculoskeletal: Normal range of motion. He exhibits no edema or deformity.  He has mild midline and bilateral paracervical tenderness.   Neurological: He is alert and oriented to person, place, and time. No sensory deficit. He exhibits normal muscle tone.  CN's 3-12 grossly  intact. Speech is clear and focused. No facial asymmetry. No lateralizing weakness. No deficits of coordination.   Skin: Skin is warm and dry. No rash noted.  Psychiatric: He has a normal mood and affect.     ED Treatments / Results  Labs (all labs ordered are listed, but only abnormal results are displayed) Labs Reviewed - No data to display  EKG  EKG Interpretation None       Radiology Ct Head Wo Contrast  Result Date: 02/24/2017 CLINICAL DATA:  Fall striking head on furniture.  Forehead bruising. EXAM: CT HEAD WITHOUT CONTRAST CT MAXILLOFACIAL WITHOUT CONTRAST CT CERVICAL SPINE WITHOUT CONTRAST TECHNIQUE: Multidetector CT imaging of the head, cervical spine, and maxillofacial structures were performed using the standard protocol without intravenous contrast. Multiplanar CT image reconstructions of the cervical spine and maxillofacial structures were also generated. COMPARISON:  None. FINDINGS: CT HEAD FINDINGS Brain: Mild generalized atrophy, normal for age. Mild for age chronic small vessel ischemia. No intracranial hemorrhage, mass effect, or midline shift. No hydrocephalus. The basilar cisterns are patent. No evidence of territorial infarct or acute ischemia. No extra-axial or intracranial fluid collection. Vascular: Atherosclerosis of skullbase vasculature without hyperdense vessel or abnormal calcification. Skull: No fracture or focal lesion. Other: Small right frontal scalp hematoma. CT MAXILLOFACIAL FINDINGS Osseous: Nasal bone, zygomatic arches and mandibles are intact. The temporomandibular joints are congruent. Orbits: Both orbits and globes are intact. No orbital fracture. Bilateral cataract resection. Sinuses: Mild mucosal thickening throughout the ethmoid air cells. No sinus fluid levels. No fracture. Soft tissues: Negative. CT CERVICAL SPINE FINDINGS Alignment: Normal. Skull base and vertebrae: Lucency through the base of the dens anteriorly, with questionable sclerotic  margins. Vertebral body heights are maintained. Degenerative endplate changes throughout. The dens and skull base are intact. C1-C2 degenerative change with pannus. Soft tissues and spinal canal: No prevertebral fluid or swelling. No visible canal hematoma. Disc levels: Diffuse disc space narrowing and endplate spurring. Multilevel facet arthropathy. Upper chest: Negative. Other: Carotid calcifications. IMPRESSION: 1. No acute intracranial abnormality. No skull fracture. Small right frontal scalp hematoma. 2. No facial bone fracture. 3. Lucency through the base of the dens likely has sclerotic margins. While this is unlikely to represent an acute fracture, in the  setting of trauma, MRI may be considered to evaluate for bone marrow edema or ligamentous injury. There is no associated prevertebral edema. 4. Multilevel degenerative change throughout the cervical spine without additional findings to suggest fracture. These results were called by telephone at the time of interpretation on 02/24/2017 at 1:15 am to Dr. Glynn Octave , who verbally acknowledged these results. Electronically Signed   By: Rubye Oaks M.D.   On: 02/24/2017 01:15   Ct Cervical Spine Wo Contrast  Result Date: 02/24/2017 CLINICAL DATA:  Fall striking head on furniture.  Forehead bruising. EXAM: CT HEAD WITHOUT CONTRAST CT MAXILLOFACIAL WITHOUT CONTRAST CT CERVICAL SPINE WITHOUT CONTRAST TECHNIQUE: Multidetector CT imaging of the head, cervical spine, and maxillofacial structures were performed using the standard protocol without intravenous contrast. Multiplanar CT image reconstructions of the cervical spine and maxillofacial structures were also generated. COMPARISON:  None. FINDINGS: CT HEAD FINDINGS Brain: Mild generalized atrophy, normal for age. Mild for age chronic small vessel ischemia. No intracranial hemorrhage, mass effect, or midline shift. No hydrocephalus. The basilar cisterns are patent. No evidence of territorial infarct  or acute ischemia. No extra-axial or intracranial fluid collection. Vascular: Atherosclerosis of skullbase vasculature without hyperdense vessel or abnormal calcification. Skull: No fracture or focal lesion. Other: Small right frontal scalp hematoma. CT MAXILLOFACIAL FINDINGS Osseous: Nasal bone, zygomatic arches and mandibles are intact. The temporomandibular joints are congruent. Orbits: Both orbits and globes are intact. No orbital fracture. Bilateral cataract resection. Sinuses: Mild mucosal thickening throughout the ethmoid air cells. No sinus fluid levels. No fracture. Soft tissues: Negative. CT CERVICAL SPINE FINDINGS Alignment: Normal. Skull base and vertebrae: Lucency through the base of the dens anteriorly, with questionable sclerotic margins. Vertebral body heights are maintained. Degenerative endplate changes throughout. The dens and skull base are intact. C1-C2 degenerative change with pannus. Soft tissues and spinal canal: No prevertebral fluid or swelling. No visible canal hematoma. Disc levels: Diffuse disc space narrowing and endplate spurring. Multilevel facet arthropathy. Upper chest: Negative. Other: Carotid calcifications. IMPRESSION: 1. No acute intracranial abnormality. No skull fracture. Small right frontal scalp hematoma. 2. No facial bone fracture. 3. Lucency through the base of the dens likely has sclerotic margins. While this is unlikely to represent an acute fracture, in the setting of trauma, MRI may be considered to evaluate for bone marrow edema or ligamentous injury. There is no associated prevertebral edema. 4. Multilevel degenerative change throughout the cervical spine without additional findings to suggest fracture. These results were called by telephone at the time of interpretation on 02/24/2017 at 1:15 am to Dr. Glynn Octave , who verbally acknowledged these results. Electronically Signed   By: Rubye Oaks M.D.   On: 02/24/2017 01:15   Ct Maxillofacial Wo  Contrast  Result Date: 02/24/2017 CLINICAL DATA:  Fall striking head on furniture.  Forehead bruising. EXAM: CT HEAD WITHOUT CONTRAST CT MAXILLOFACIAL WITHOUT CONTRAST CT CERVICAL SPINE WITHOUT CONTRAST TECHNIQUE: Multidetector CT imaging of the head, cervical spine, and maxillofacial structures were performed using the standard protocol without intravenous contrast. Multiplanar CT image reconstructions of the cervical spine and maxillofacial structures were also generated. COMPARISON:  None. FINDINGS: CT HEAD FINDINGS Brain: Mild generalized atrophy, normal for age. Mild for age chronic small vessel ischemia. No intracranial hemorrhage, mass effect, or midline shift. No hydrocephalus. The basilar cisterns are patent. No evidence of territorial infarct or acute ischemia. No extra-axial or intracranial fluid collection. Vascular: Atherosclerosis of skullbase vasculature without hyperdense vessel or abnormal calcification. Skull: No fracture or focal lesion.  Other: Small right frontal scalp hematoma. CT MAXILLOFACIAL FINDINGS Osseous: Nasal bone, zygomatic arches and mandibles are intact. The temporomandibular joints are congruent. Orbits: Both orbits and globes are intact. No orbital fracture. Bilateral cataract resection. Sinuses: Mild mucosal thickening throughout the ethmoid air cells. No sinus fluid levels. No fracture. Soft tissues: Negative. CT CERVICAL SPINE FINDINGS Alignment: Normal. Skull base and vertebrae: Lucency through the base of the dens anteriorly, with questionable sclerotic margins. Vertebral body heights are maintained. Degenerative endplate changes throughout. The dens and skull base are intact. C1-C2 degenerative change with pannus. Soft tissues and spinal canal: No prevertebral fluid or swelling. No visible canal hematoma. Disc levels: Diffuse disc space narrowing and endplate spurring. Multilevel facet arthropathy. Upper chest: Negative. Other: Carotid calcifications. IMPRESSION: 1. No  acute intracranial abnormality. No skull fracture. Small right frontal scalp hematoma. 2. No facial bone fracture. 3. Lucency through the base of the dens likely has sclerotic margins. While this is unlikely to represent an acute fracture, in the setting of trauma, MRI may be considered to evaluate for bone marrow edema or ligamentous injury. There is no associated prevertebral edema. 4. Multilevel degenerative change throughout the cervical spine without additional findings to suggest fracture. These results were called by telephone at the time of interpretation on 02/24/2017 at 1:15 am to Dr. Glynn OctaveSTEPHEN RANCOUR , who verbally acknowledged these results. Electronically Signed   By: Rubye OaksMelanie  Ehinger M.D.   On: 02/24/2017 01:15    Procedures Procedures (including critical care time)  Medications Ordered in ED Medications - No data to display   Initial Impression / Assessment and Plan / ED Course  I have reviewed the triage vital signs and the nursing notes.  Pertinent labs & imaging results that were available during my care of the patient were reviewed by me and considered in my medical decision making (see chart for details).     Patient presents after fall with head injury and neck pain. He is neurologically intact.   CT Head/neck/maxillo shows a possible dens fracture - recommending MR to further evaluate. The patient is placed in a cervical collar (he previously refused) and MR ordered. He denies need for any comfort measures.   MR confirms there is no acute fracture. Collar removed. The patient's neurologic exam is unchanged. All questions answered. He is felt stable for discharge home.   Final Clinical Impressions(s) / ED Diagnoses   Final diagnoses:  None   Facial contusion Fall   New Prescriptions New Prescriptions   No medications on file     Elpidio AnisUpstill, Maeby Vankleeck, Cordelia Poche-C 02/24/17 Asencion [id number]    Rancour, Stephen, MD 02/24/17 [phone]

## 2017-02-24 NOTE — ED Triage Notes (Signed)
Patient from assisted living, was sitting in roller chair and slipped off while falling asleep and fell off and hit dresser.  He was able to get up after the incident.  Patient is on plavix, no LOC, full recall, no headache.  He has a hematoma to right side of forehead above eye.  He is having some neck pain, refused to have a collar placed by EMS due to comfort.  CAOx4, GCS of 15.

## 2017-02-26 ENCOUNTER — Ambulatory Visit (INDEPENDENT_AMBULATORY_CARE_PROVIDER_SITE_OTHER): Payer: Medicare Other | Admitting: Nurse Practitioner

## 2017-02-26 ENCOUNTER — Encounter: Payer: Self-pay | Admitting: Nurse Practitioner

## 2017-02-26 VITALS — BP 160/78 | HR 52 | Temp 98.4°F | Resp 16 | Ht 68.0 in | Wt 170.0 lb

## 2017-02-26 DIAGNOSIS — W19XXXD Unspecified fall, subsequent encounter: Secondary | ICD-10-CM | POA: Diagnosis not present

## 2017-02-26 DIAGNOSIS — T148XXA Other injury of unspecified body region, initial encounter: Secondary | ICD-10-CM

## 2017-02-26 MED ORDER — CAPSAICIN 0.025 % EX CREA: TOPICAL_CREAM | Freq: Two times a day (BID) | CUTANEOUS | 0 refills | Status: DC

## 2017-02-26 NOTE — Patient Instructions (Addendum)
I think your pain is likely due to muscle stiffness from your fall.  I have sent a prescription for capsaicin cream to your pharmacy. Apply this cream 3 times daily to painful areas upon patients request. Apply heat for 15-20 minutes at a time on patients request.   Please follow up for any weakness, numbness or tingling.   It was nice to see you today. I hope you feel better soon!

## 2017-02-26 NOTE — Progress Notes (Signed)
Subjective:    Patient ID: Benjamin Mccall, male    DOB: 05-17-1929, 83 y.o.   MRN: [id number]  HPI Mr. Benjamin Mccall is an 83 yo male who presents today for an emergency department follow up. He suffered a fall at home on 10/31. He slipped from a chair onto the floor and hitting his head on a piece of furniture. He did not suffer from LOC. He did have a CT head/neck/maxillofacial which showed a possible dens fracture with a follow up MRI that confirmed there was no acute fracture. Pertinent notes and diagnostic imaging were reviewed.  Since he returned to his assisted living facility, he's continued to have pain in his bilateral shoulders and the sides of his neck. He desrcibes as a tightness. The pain is worse with rotation of his head. He feels it is difficult to rotate his head due to the tight pain. hes been taking tylenol and applying ice without much relief. hes not tried any heat. He denies any syncope, dizziness, weakness, nausea, vomiting, numbness or tingling since return home.  Review of Systems See HPI  Past Medical History:  Diagnosis Date  . Asthma   . Atrial fibrillation (HCC)   . Chest pain   . Diabetes mellitus without complication (HCC)   . GERD (gastroesophageal reflux disease)   . Hernia, hiatal   . Hyperlipidemia   . Hypertension   . Hypothyroid      Social History   Social History  . Marital status: Married    Spouse name: N/A  . Number of children: 2  . Years of education: 12   Occupational History  . 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 Smoking status: Former Smoker    Packs/day: 0.50    Years: 7.00    Types: Pipe, Cigars    Quit date: 04/27/1968  . Smokeless tobacco: Never Used  . Alcohol use No  . Drug use: No  . Sexual activity: Not on file   Other Topics Concern  . Not on file   Social History Narrative   Born [place] raised in [place]. Residing in [place] [facility] with his wife.  Denies any beliefs effecting healthcare.     Past Surgical History:  Procedure Laterality Date  . APPENDECTOMY    . CATARACT EXTRACTION    . HIATAL HERNIA REPAIR    . PERCUTANEOUS CORONARY STENT INTERVENTION (PCI-S)      Family History  Problem Relation Age of Onset  . Hypertension Mother   . Stroke Mother   . Ovarian cancer Mother   . Stroke Father   . Diabetes Son     Allergies  Allergen Reactions  . Reglan [Metoclopramide]     Tremors   . Sulfa Antibiotics Other (See Comments)    shakes    Current Outpatient Prescriptions on File Prior to Visit  Medication Sig Dispense Refill  . acetaminophen (TYLENOL) 325 MG tablet Take 650 mg by mouth every 6 (six) hours as needed.    [name]. albuterol (PROVENTIL HFA;VENTOLIN HFA) 108 (90 BASE) MCG/ACT inhaler Inhale 2 puffs into the lungs every 4 (four) hours as needed for wheezing or shortness of breath.     . ALPRAZolam (XANAX) 0.25 MG tablet Take 0.25 mg by mouth at bedtime as needed for anxiety.    [name]. amoxicillin (AMOXIL) 500 MG capsule     . aspirin 81 MG tablet Take 81 mg by  mouth every morning.     [name] atorvastatin (LIPITOR) 20 MG tablet Take 20 mg by mouth daily at 6 PM.     . Cholecalciferol (VITAMIN D3) 1000 UNITS CAPS Take 1,000 Units by mouth every morning.     . citalopram (CELEXA) 10 MG tablet Take 10 mg by mouth every morning.     . clopidogrel (PLAVIX) 75 MG tablet Take 75 mg by mouth every morning.     [name] CREON 12000 units CPEP capsule Take 12,000 mg by mouth 3 (three) times daily.    [name] diltiazem (CARDIZEM CD) 240 MG 24 hr capsule Take 240 mg by mouth every morning.     . ezetimibe (ZETIA) 10 MG tablet Take 1 tablet (10 mg total) by mouth daily. (Patient taking differently: Take 10 mg by mouth every morning. ) 30 tablet 0  . fluticasone (FLONASE) 50 MCG/ACT nasal spray Place 2 sprays into both nostrils as needed (nasal stuffiness). 16 g 3  . gabapentin (NEURONTIN) 300 MG capsule Take 300 mg by mouth at bedtime.    .  Glycopyrrolate-Formoterol (BEVESPI AEROSPHERE) 9-4.8 MCG/ACT AERO Inhale 1 puff into the lungs 2 (two) times daily. 1 Inhaler 0  . guaiFENesin (MUCINEX) 600 MG 12 hr tablet Take 600 mg by mouth 2 (two) times daily as needed for cough.     . hydrochlorothiazide (HYDRODIURIL) 25 MG tablet Take 25 mg by mouth every morning.     . hydrocortisone cream 1 % Apply 1 application topically daily as needed for itching.    [name] ibuprofen (ADVIL,MOTRIN) 200 MG tablet Take as needed for joint pain    . Iron-Vitamins (THEREMS H PO) Take 1 tablet by mouth daily.    [name] levothyroxine (SYNTHROID, LEVOTHROID) 100 MCG tablet Take 100 mcg by mouth every morning.     . loratadine (CLARITIN) 10 MG tablet Take 1 tablet (10 mg total) by mouth daily as needed (drainage, drippy nose). 30 tablet 9  . metFORMIN (GLUCOPHAGE) 500 MG tablet Take 500 mg by mouth 2 (two) times daily with a meal.     . nitroGLYCERIN (NITRODUR - DOSED IN MG/24 HR) 0.6 mg/hr patch Apply patch every morning and remove at bedtime    . nitroGLYCERIN (NITROSTAT) 0.4 MG SL tablet Place 0.4 mg under the tongue every 5 (five) minutes as needed for chest pain.     . polyethylene glycol powder (GLYCOLAX/MIRALAX) powder Take 1 Container by mouth daily.     . polyvinyl alcohol (ARTIFICIAL TEARS) 1.4 % ophthalmic solution Place 1 drop into both eyes 3 (three) times daily as needed for dry eyes.    . SF 1.1 % GEL dental gel     . simethicone (GAS-X EXTRA STRENGTH) 125 MG chewable tablet Chew 1-2 tablets (125-250 mg total) by mouth 3 (three) times daily with meals. Max 4 tablets daily (Patient taking differently: Chew 125-250 mg by mouth 4 (four) times daily as needed (gas). Max 4 tablets daily) 30 tablet 0   No current facility-administered medications on file prior to visit.     BP (!) 160/78 (BP Location: Left Arm, Patient Position: Sitting, Cuff Size: Normal)   Pulse (!) 52   Temp 98.4 F (36.9 C) (Oral)   Resp 16   Ht 5\' 8"  (1.727 m)   Wt 170 lb (77.1 kg)    SpO2 98%   BMI 25.85 kg/m      Objective:   Physical Exam  Constitutional: He is oriented to person, place, and time. He appears well-developed  and well-nourished.  Ambulates with cane  HENT:  Head: Normocephalic. Head is without abrasion, without laceration, without right periorbital erythema and without left periorbital erythema.  Neck: Trachea normal. Muscular tenderness present. No spinous process tenderness present. Decreased range of motion present. No edema and no erythema present.  Cardiovascular: Normal rate, regular rhythm and intact distal pulses.   Pulmonary/Chest: Effort normal and breath sounds normal.  Musculoskeletal:       Cervical back: He exhibits pain and spasm. He exhibits no bony tenderness, no swelling, no edema and no deformity.  Tenderness to trapezius bilaterally.  Neurological: He is alert and oriented to person, place, and time. Coordination normal.  Skin: Skin is warm and dry.  Psychiatric: He has a normal mood and affect. Judgment and thought content normal.       Assessment & Plan:  Muscle strain- No bony tenderness, CT head, CT maxillofacial, MRI Cervical spine in ED negative for fracture. Muscular tenderness to trapezius bilaterally. Suspect pain is due to muscle strain post fall. Prescription for capsaicin tid. Hell apply heat several times a day and begin gentle stretches tomorrow. Encouraged to follow up for persistent pain or worrisome symptoms including weakness, numbness, tingling. He was agreeable to this plan.

## 2017-03-09 ENCOUNTER — Ambulatory Visit (INDEPENDENT_AMBULATORY_CARE_PROVIDER_SITE_OTHER): Payer: Medicare Other | Admitting: Family Medicine

## 2017-03-09 ENCOUNTER — Encounter: Payer: Self-pay | Admitting: Family Medicine

## 2017-03-09 VITALS — BP 136/78 | HR 58 | Temp 97.7°F | Ht 68.0 in | Wt 176.0 lb

## 2017-03-09 DIAGNOSIS — M542 Cervicalgia: Secondary | ICD-10-CM

## 2017-03-09 NOTE — Patient Instructions (Signed)
Thank you for coming in,   Please follow up in 4-6 weeks if no improvement.    Please feel free to call with any questions or concerns at any time, at [phone]. --Dr. Jordan LikesSchmitz

## 2017-03-09 NOTE — Progress Notes (Signed)
Manson PasseyDonald R Contee - 83 y.o. male MRN [id number]  Date of birth: [birth year]  SUBJECTIVE:  Including CC & ROS.  Chief Complaint  Patient presents with  . Neck Pain    Fell 2 weeks ago out of his chair and landed on his face. Has been using motrin with no improvement .Painful to turn.    [name] is a 83 y.o. male that is presenting with bilateral neck pain with no radicular symptoms. The pain is worse with moving his head from side to side. The pain has an ongoing since his fall. He was evaluated in the emergency room and no fractures were repaired. He is living at an assisted living facility. He has been using Tylenol and ibuprofen. They have also been applying some cream. The pain is mild in nature. It is a muscle soreness. He denies any problems with moving his shoulders. Denies any numbness or tingling.  Review of the CT cervical spine from 10/31 shows no acute intracranial abnormality or facial bone fracture. There are multiple levels of degenerative changes throughout the cervical spine.  MRI of the cervical spine from 10/31 no acute fracture of the cervical spine or ligamentous injury. There is moderate C4-5 spinal stenosis and multiple levels of moderate neural foraminal stenosis.   Review of Systems  Constitutional: Negative for fever.  Musculoskeletal: Positive for neck pain. Negative for gait problem.  Skin: Negative for color change.  Neurological: Negative for weakness and numbness.    HISTORY: Past Medical, Surgical, Social, and Family History Reviewed & Updated per EMR.   Pertinent Historical Findings include:  Past Medical History:  Diagnosis Date  . Asthma   . Atrial fibrillation (HCC)   . Chest pain   . Diabetes mellitus without complication (HCC)   . GERD (gastroesophageal reflux disease)   . Hernia, hiatal   . Hyperlipidemia   . Hypertension   . Hypothyroid     Past Surgical History:  Procedure Laterality Date  . APPENDECTOMY    . CATARACT EXTRACTION      . HIATAL HERNIA REPAIR    . PERCUTANEOUS CORONARY STENT INTERVENTION (PCI-S)      Allergies  Allergen Reactions  . Reglan [Metoclopramide]     Tremors   . Sulfa Antibiotics Other (See Comments)    shakes    Family History  Problem Relation Age of Onset  . Hypertension Mother   . Stroke Mother   . Ovarian cancer Mother   . Stroke Father   . Diabetes Son      Social History   Socioeconomic History  . Marital status: Married    Spouse name: Not on file  . Number of children: 2  . Years of education: 7412  . Highest education level: Not on file  Social Needs  . Financial resource strain: Not on file  . Food insecurity - worry: Not on file  . Food insecurity - inability: Not on file  . Transportation needs - medical: Not on file  . Transportation needs - non-medical: Not on file  Occupational History  . 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 Smoking status: Former Smoker    Packs/day: 0.50    Years: 7.00    Pack years: 3.50    Types: Pipe, Cigars    Last attempt to quit: 04/27/1968    Years since quitting: 48.9  . Smokeless tobacco: Never Used  Substance and Sexual Activity  .  Alcohol use: No    Alcohol/week: 0.0 oz  . Drug use: No  . Sexual activity: Not on file  Other Topics Concern  . Not on file  Social History Narrative   Born [place] raised in [place]. Residing in [place] [facility] with his wife. Denies any beliefs effecting healthcare.      PHYSICAL EXAM:  VS: BP 136/78 (BP Location: Left Arm, Patient Position: Sitting, Cuff Size: Normal)   Pulse (!) 58   Temp 97.7 F (36.5 C) (Oral)   Ht 5\' 8"  (1.727 m)   Wt 176 lb (79.8 kg)   SpO2 98%   BMI 26.76 kg/m  Physical Exam Gen: NAD, alert, cooperative with exam, well-appearing ENT: normal lips, normal nasal mucosa,  Eye: normal EOM, normal conjunctiva and lids CV:  no edema, +2 pedal pulses   Resp: no accessory muscle use, non-labored,   Skin: no rashes, no areas of induration  Neuro: normal tone, normal sensation to touch Psych:  normal insight, alert and oriented MSK:  Neck: Some tenderness to palpation bilaterally of the cervical muscles. No tenderness to palpation of the cervical midline spine. Limited range of motion with lateral neck bending to the right. Some lateral rotation to the right also limited. Normal Flexion and extension. Normal showed strength to resistance. Normal shoulder range of motion in abduction and flexion. Neurovascularly intact.       ASSESSMENT & PLAN:   Neck pain This appears to be muscular in nature and muscle soreness. - Discontinue ibuprofen at this time. Can continue Tylenol - Counseled on supportive care - Referral to physical therapy - Follow-up in 4-6 weeks if no improvement. Can consider low-dose of baclofen.

## 2017-03-09 NOTE — Progress Notes (Signed)
Subjective:   Benjamin Mccall is a 83 y.o. male who presents for Medicare Annual/Subsequent preventive examination.  Review of Systems:  No ROS.  Medicare Wellness Visit. Additional risk factors are reflected in the social history.  Cardiac Risk Factors include: diabetes mellitus;advanced age (>4555men, 66>65 women);dyslipidemia;hypertension;male gender  Sleep patterns: feels rested on waking, gets up 2-3 times nightly to void and sleeps 6-7 hours nightly.    Home Safety/Smoke Alarms: Feels safe in home. Smoke alarms in place.  Living environment; residence and [facility] Safety: assisted living, equipment: Cane, Type: Single [employer] Cane, no firearms. Seat Belt Safety/Bike Helmet: Wears seat belt.     Objective:    Vitals: BP (!) 152/74   Pulse (!) 56   Temp 97.9 F (36.6 C)   Resp 19   Ht 5\' 8"  (1.727 m)   Wt 171 lb (77.6 kg)   SpO2 99%   BMI 26.00 kg/m   Body mass index is 26 kg/m.  Tobacco Social History   Tobacco Use  Smoking Status Former Smoker  . Packs/day: 0.50  . Years: 7.00  . Pack years: 3.50  . Types: Pipe, Cigars  . Last attempt to quit: 04/27/1968  . Years since quitting: 48.9  Smokeless Tobacco Never Used     Counseling given: Not Answered   Past Medical History:  Diagnosis Date  . Asthma   . Atrial fibrillation (HCC)   . Chest pain   . Diabetes mellitus without complication (HCC)   . GERD (gastroesophageal reflux disease)   . Hernia, hiatal   . Hyperlipidemia   . Hypertension   . Hypothyroid    Past Surgical History:  Procedure Laterality Date  . APPENDECTOMY    . CATARACT EXTRACTION    . HIATAL HERNIA REPAIR    . PERCUTANEOUS CORONARY STENT INTERVENTION (PCI-S)     Family History  Problem Relation Age of Onset  . Hypertension Mother   . Stroke Mother   . Ovarian cancer Mother   . Stroke Father   . Diabetes Son    Social History   Substance and Sexual Activity  Sexual Activity Not on file    Outpatient Encounter Medications as  of 03/10/2017  Medication Sig  . acetaminophen (TYLENOL) 325 MG tablet Take 650 mg by mouth every 6 (six) hours as needed.  [name]. albuterol (PROVENTIL HFA;VENTOLIN HFA) 108 (90 BASE) MCG/ACT inhaler Inhale 2 puffs into the lungs every 4 (four) hours as needed for wheezing or shortness of breath.   . ALPRAZolam (XANAX) 0.25 MG tablet Take 0.25 mg by mouth at bedtime as needed for anxiety.  [name]. aspirin 81 MG tablet Take 81 mg by mouth every morning.   [name]. atorvastatin (LIPITOR) 20 MG tablet Take 20 mg by mouth daily at 6 PM.   . capsaicin (ZOSTRIX) 0.025 % cream Apply topically 2 (two) times daily.  . Cholecalciferol (VITAMIN D3) 1000 UNITS CAPS Take 1,000 Units by mouth every morning.   . citalopram (CELEXA) 10 MG tablet Take 10 mg by mouth every morning.   . clopidogrel (PLAVIX) 75 MG tablet Take 75 mg by mouth every morning.   [name]. CREON 12000 units CPEP capsule Take 12,000 mg by mouth 3 (three) times daily.  [name]. diltiazem (CARDIZEM CD) 240 MG 24 hr capsule Take 240 mg by mouth every morning.   . ezetimibe (ZETIA) 10 MG tablet Take 1 tablet (10 mg total) by mouth daily. (Patient taking differently: Take 10 mg by mouth every morning. )  . fluticasone (  FLONASE) 50 MCG/ACT nasal spray Place 2 sprays into both nostrils as needed (nasal stuffiness).  . gabapentin (NEURONTIN) 300 MG capsule Take 300 mg by mouth at bedtime.  . Glycopyrrolate-Formoterol (BEVESPI AEROSPHERE) 9-4.8 MCG/ACT AERO Inhale 1 puff into the lungs 2 (two) times daily.  [name]. guaiFENesin (MUCINEX) 600 MG 12 hr tablet Take 600 mg by mouth 2 (two) times daily as needed for cough.   . hydrochlorothiazide (HYDRODIURIL) 25 MG tablet Take 25 mg by mouth every morning.   . hydrocortisone cream 1 % Apply 1 application topically daily as needed for itching.  [name]. ibuprofen (ADVIL,MOTRIN) 200 MG tablet Take as needed for joint pain  . Iron-Vitamins (THEREMS H PO) Take 1 tablet by mouth daily.  [name]. levothyroxine (SYNTHROID, LEVOTHROID) 100 MCG tablet Take 100  mcg by mouth every morning.   . loratadine (CLARITIN) 10 MG tablet Take 1 tablet (10 mg total) by mouth daily as needed (drainage, drippy nose).  . metFORMIN (GLUCOPHAGE) 500 MG tablet Take 500 mg by mouth 2 (two) times daily with a meal.   . nitroGLYCERIN (NITRODUR - DOSED IN MG/24 HR) 0.6 mg/hr patch Apply patch every morning and remove at bedtime  . nitroGLYCERIN (NITROSTAT) 0.4 MG SL tablet Place 0.4 mg under the tongue every 5 (five) minutes as needed for chest pain.   . polyethylene glycol powder (GLYCOLAX/MIRALAX) powder Take 1 Container by mouth daily.   . polyvinyl alcohol (ARTIFICIAL TEARS) 1.4 % ophthalmic solution Place 1 drop into both eyes 3 (three) times daily as needed for dry eyes.  . SF 1.1 % GEL dental gel   . simethicone (GAS-X EXTRA STRENGTH) 125 MG chewable tablet Chew 1-2 tablets (125-250 mg total) by mouth 3 (three) times daily with meals. Max 4 tablets daily (Patient taking differently: Chew 125-250 mg by mouth 4 (four) times daily as needed (gas). Max 4 tablets daily)  . [DISCONTINUED] amoxicillin (AMOXIL) 500 MG capsule    No facility-administered encounter medications on file as of 03/10/2017.     Activities of Daily Living In your present state of health, do you have any difficulty performing the following activities: 03/10/2017  Hearing? N  Vision? N  Difficulty concentrating or making decisions? N  Walking or climbing stairs? N  Dressing or bathing? N  Doing errands, shopping? Y  Preparing Food and eating ? Y  Using the Toilet? N  In the past six months, have you accidently leaked urine? N  Do you have problems with loss of bowel control? N  Managing your Medications? Y  Managing your Finances? N  Housekeeping or managing your Housekeeping? Y  Some recent data might be hidden    Patient Care Team: Evaristo BuryShambley, Ashleigh N, NP as PCP - General (Nurse Practitioner)   Assessment:    Physical assessment deferred to PCP.  Exercise Activities and Dietary  recommendations Current Exercise Habits: Home exercise routine, Type of exercise: walking, Time (Minutes): 20, Frequency (Times/Week): 3, Weekly Exercise (Minutes/Week): 60, Intensity: Mild, Exercise limited by: orthopedic condition(s)  Diet (meal preparation, eat out, water intake, caffeinated beverages, dairy products, fruits and vegetables): in general, a "healthy" diet  , well balanced   Reviewed heart healthy and diabetic diet, encouraged patient to increase daily water intake.     Goals    None     Fall Risk Fall Risk  03/10/2017 03/24/2016  Falls in the past year? No No   Depression Screen PHQ 2/9 Scores 03/10/2017 03/24/2016  PHQ - 2 Score 0 0  PHQ-  9 Score 0 -    Cognitive Function MMSE - Mini Mental State Exam 03/10/2017  Orientation to time 5  Orientation to Place 5  Registration 3  Attention/ Calculation 4  Recall 1  Language- name 2 objects 2  Language- repeat 1  Language- follow 3 step command 3  Language- read & follow direction 1  Write a sentence 1  Copy design 1  Total score 27        Immunization History  Administered Date(s) Administered  . Influenza Split 01/25/2014  . Influenza, High Dose Seasonal PF 01/23/2015, 01/23/2016, 01/26/2017  . Pneumococcal Conjugate-13 11/25/2013  . Tdap 01/23/2015   Screening Tests Health Maintenance  Topic Date Due  . HEMOGLOBIN A1C  07/22/2016  . FOOT EXAM  01/22/2017  . URINE MICROALBUMIN  01/22/2017  . OPHTHALMOLOGY EXAM  08/25/2017  . TETANUS/TDAP  01/22/2025  . INFLUENZA VACCINE  Completed  . PNA vac Low Risk Adult  Completed      Plan:    Continue doing brain stimulating activities (puzzles, reading, adult coloring books, staying active) to keep memory sharp.   Continue to eat heart healthy diet (full of fruits, vegetables, whole grains, lean protein, water--limit salt, fat, and sugar intake) and increase physical activity as tolerated.  I have personally reviewed and noted the following in the  patient's chart:   . Medical and social history . Use of alcohol, tobacco or illicit drugs  . Current medications and supplements . Functional ability and status . Nutritional status . Physical activity . Advanced directives . List of other physicians . Vitals . Screenings to include cognitive, depression, and falls . Referrals and appointments  In addition, I have reviewed and discussed with patient certain preventive protocols, quality metrics, and best practice recommendations. A written personalized care plan for preventive services as well as general preventive health recommendations were provided to patient.     Wanda Plump, RN  03/10/2017

## 2017-03-10 ENCOUNTER — Ambulatory Visit: Payer: Medicare Other

## 2017-03-10 ENCOUNTER — Ambulatory Visit (INDEPENDENT_AMBULATORY_CARE_PROVIDER_SITE_OTHER): Payer: Medicare Other | Admitting: *Deleted

## 2017-03-10 VITALS — BP 152/74 | HR 56 | Temp 97.9°F | Resp 19 | Ht 68.0 in | Wt 171.0 lb

## 2017-03-10 DIAGNOSIS — Z0000 Encounter for general adult medical examination without abnormal findings: Secondary | ICD-10-CM

## 2017-03-10 DIAGNOSIS — M542 Cervicalgia: Secondary | ICD-10-CM | POA: Insufficient documentation

## 2017-03-10 NOTE — Patient Instructions (Signed)
Continue doing brain stimulating activities (puzzles, reading, adult coloring books, staying active) to keep memory sharp.   Continue to eat heart healthy diet (full of fruits, vegetables, whole grains, lean protein, water--limit salt, fat, and sugar intake) and increase physical activity as tolerated.   Benjamin Mccall , Thank you for taking time to come for your Medicare Wellness Visit. I appreciate your ongoing commitment to your health goals. Please review the following plan we discussed and let me know if I can assist you in the future.   These are the goals we discussed: Stay as healthy and as independent as possible. Goals    None      This is a list of the screening recommended for you and due dates:  Health Maintenance  Topic Date Due  . Hemoglobin A1C  07/22/2016  . Complete foot exam   01/22/2017  . Urine Protein Check  01/22/2017  . Eye exam for diabetics  08/25/2017  . Tetanus Vaccine  01/22/2025  . Flu Shot  Completed  . Pneumonia vaccines  Completed

## 2017-03-10 NOTE — Assessment & Plan Note (Signed)
This appears to be muscular in nature and muscle soreness. - Discontinue ibuprofen at this time. Can continue Tylenol - Counseled on supportive care - Referral to physical therapy - Follow-up in 4-6 weeks if no improvement. Can consider low-dose of baclofen.

## 2017-03-12 ENCOUNTER — Telehealth: Payer: Self-pay | Admitting: Nurse Practitioner

## 2017-03-12 MED ORDER — ZOSTER VAC RECOMB ADJUVANTED 50 MCG/0.5ML IM SUSR: 0.5000 mL | Freq: Once | INTRAMUSCULAR | 1 refills | Status: AC

## 2017-03-12 NOTE — Telephone Encounter (Signed)
shingrix order placed

## 2017-03-12 NOTE — Telephone Encounter (Signed)
Noted  

## 2017-03-12 NOTE — Telephone Encounter (Signed)
FYI: Home called and said they have received the referral and will be going out on 03/14/17

## 2017-03-12 NOTE — Progress Notes (Signed)
Medical screening examination/treatment/procedure(s) were performed by the Wellness Coach, RN. As primary care provider I was immediately available for consulation/collaboration. I agree with above documentation. Floretta Petro, NP  

## 2017-03-23 MED FILL — SHINGRIX VIAL KIT: 50 | 1 days supply | Qty: 1 | Fill #0

## 2017-04-07 ENCOUNTER — Ambulatory Visit: Payer: Medicare Other | Admitting: Podiatry

## 2017-04-08 NOTE — Progress Notes (Signed)
Patient ID: Benjamin Mccall, male   DOB: 1929/10/18, 83 y.o.   MRN: [id number]     83 y.o.  diabetic referred for cardiovascular f/u  Initially seen 12/14/15 History of HTN and chronic sarcoid seen by Dr Johnnette Litter Dr Filbert Schilder in [place].  Distant history of CAD with stents more then 10 years ago Had angina at that time and none recently.  ? PAF but in SR now and not on anticoagulation.  He still drives and gets around well outside of orthopedic issues. Mild exertional dyspnea no chest pain palpitations , syncope or TIA's.  Compliant with meds Lives at [facility] with wife of 50 years although she has recently been at [facility] for rehab from stroke.  Son with him today and seems invested in his care.  Mild chronic LE edema on diuretic    Echo 04/25/14 reviewed  Study Conclusions  - Left ventricle: Wall thickness was increased in a pattern of mild LVH. There was mild focal basal hypertrophy of the septum. Systolic function was normal. The estimated ejection fraction was in the range of 55% to 60%. Wall motion was normal; there were no regional wall motion abnormalities. Doppler parameters are consistent with abnormal left ventricular relaxation (grade 1 diastolic dysfunction). - Left atrium: The atrium was mildly dilated.  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  All other systems reviewed and negative   General: Affect appropriate Frail elderly male  HEENT: normal Neck supple with no  adenopathy JVP normal no bruits no thyromegaly Lungs clear with no wheezing and good diaphragmatic motion Heart:  S1/S2 benign SEM murmur, no rub, gallop or click PMI normal Abdomen: benighn, BS positve, no tenderness, no AAA no bruit.  No HSM or HJR Distal pulses intact with no bruits Neuro non-focal Skin warm and dry LE varicosities with erythema   Medications Current Outpatient Medications  Medication Sig Dispense Refill  . acetaminophen (TYLENOL) 325 MG tablet Take 650 mg by mouth every 6 (six) hours as needed.    [name] albuterol (PROVENTIL HFA;VENTOLIN HFA) 108 (90 BASE) MCG/ACT inhaler Inhale 2 puffs into the lungs every 4 (four) hours as needed for wheezing or shortness of breath.     . ALPRAZolam (XANAX) 0.25 MG tablet Take 0.25 mg by mouth at bedtime as needed for anxiety.    [name] aspirin 81 MG tablet Take 81 mg by mouth every morning.     [name] atorvastatin (LIPITOR) 20 MG tablet Take 20 mg by mouth daily at 6 PM.     . capsaicin (ZOSTRIX) 0.025 % cream Apply topically 2 (two) times daily. 60 g 0  . Cholecalciferol (VITAMIN D3) 1000 UNITS CAPS Take 1,000 Units by mouth every morning.     . citalopram (CELEXA) 10 MG tablet Take 10 mg by mouth every morning.     . clopidogrel (PLAVIX) 75 MG tablet Take 75 mg by mouth every morning.     [name] CREON 12000 units CPEP capsule Take 12,000 mg by mouth 3 (three) times daily.    [name] diltiazem (CARDIZEM CD) 240  MG 24 hr capsule Take 240 mg by mouth every morning.     . ezetimibe (ZETIA) 10 MG tablet Take 1 tablet (10 mg total) by mouth daily. (Patient taking differently: Take 10 mg by mouth every morning. ) 30 tablet 0  . fluticasone (FLONASE) 50 MCG/ACT nasal spray Place 2 sprays into both nostrils as needed (nasal stuffiness). 16 g 3  . gabapentin (NEURONTIN) 300 MG capsule Take 300 mg by mouth at bedtime.    . Glycopyrrolate-Formoterol (BEVESPI AEROSPHERE) 9-4.8 MCG/ACT AERO Inhale 1 puff into the lungs 2 (two) times daily. 1 Inhaler 0  . guaiFENesin  (MUCINEX) 600 MG 12 hr tablet Take 600 mg by mouth 2 (two) times daily as needed for cough.     . hydrochlorothiazide (HYDRODIURIL) 25 MG tablet Take 25 mg by mouth every morning.     . hydrocortisone cream 1 % Apply 1 application topically daily as needed for itching.    [name]. ibuprofen (ADVIL,MOTRIN) 200 MG tablet Take as needed for joint pain    . Iron-Vitamins (THEREMS H PO) Take 1 tablet by mouth daily.    [name]. levothyroxine (SYNTHROID, LEVOTHROID) 100 MCG tablet Take 100 mcg by mouth every morning.     . loratadine (CLARITIN) 10 MG tablet Take 1 tablet (10 mg total) by mouth daily as needed (drainage, drippy nose). 30 tablet 9  . metFORMIN (GLUCOPHAGE) 500 MG tablet Take 500 mg by mouth 2 (two) times daily with a meal.     . nitroGLYCERIN (NITRODUR - DOSED IN MG/24 HR) 0.6 mg/hr patch Apply patch every morning and remove at bedtime    . nitroGLYCERIN (NITROSTAT) 0.4 MG SL tablet Place 0.4 mg under the tongue every 5 (five) minutes as needed for chest pain.     . nizatidine (AXID) 150 MG capsule Take 1 capsule by mouth 2 (two) times daily.    [name]. omeprazole (PRILOSEC) 20 MG capsule Take 1 capsule by mouth daily.    . polyethylene glycol powder (GLYCOLAX/MIRALAX) powder Take 1 Container by mouth daily.     . polyvinyl alcohol (ARTIFICIAL TEARS) 1.4 % ophthalmic solution Place 1 drop into both eyes 3 (three) times daily as needed for dry eyes.    . SF 1.1 % GEL dental gel     . simethicone (GAS-X EXTRA STRENGTH) 125 MG chewable tablet Chew 1-2 tablets (125-250 mg total) by mouth 3 (three) times daily with meals. Max 4 tablets daily (Patient taking differently: Chew 125-250 mg by mouth 4 (four) times daily as needed (gas). Max 4 tablets daily) 30 tablet 0   No current facility-administered medications for this visit.     Allergies Reglan [metoclopramide] and Sulfa antibiotics  Family History: Family History  Problem Relation Age of Onset  . Hypertension Mother   . Stroke Mother   . Ovarian  cancer Mother   . Stroke Father   . Diabetes Son     Social History: Social History   Socioeconomic History  . Marital status: Married    Spouse name: Not on file  . Number of children: 2  . Years of education: 5212  . Highest education level: Not on file  Social Needs  . Financial resource strain: Not on file  . Food insecurity - worry: Not on file  . Food insecurity - inability: Not on file  . Transportation needs - medical: Not on file  . Transportation needs - non-medical: Not on file  Occupational History  . Occupation: Retired- Administrator, [employer]    Comment:  worked in a [employer] in the past  Tobacco Use  . Smoking status: Former Smoker    Packs/day: 0.50    Years: 7.00    Pack years: 3.50    Types: Pipe, Cigars    Last attempt to quit: 04/27/1968    Years since quitting: 48.9  . Smokeless tobacco: Never Used  Substance and Sexual Activity  . Alcohol use: No    Alcohol/week: 0.0 oz  . Drug use: No  . Sexual activity: Not on file  Other Topics Concern  . Not on file  Social History Narrative   [place]. Res[place] in [place] Living with his wife. Denies any beliefs effecting healthcare.     Past Surgical History:  Procedure Laterality Date  . APPENDECTOMY    . CATARACT EXTRACTION    . HIATAL HERNIA REPAIR    . PERCUTANEOUS CORONARY STENT INTERVENTION (PCI-S)      Past Medical History:  Diagnosis Date  . Asthma   . Atrial fibrillation (HCC)   . Chest pain   . Diabetes mellitus without complication (HCC)   . GERD (gastroesophageal reflux disease)   . Hernia, hiatal   . Hyperlipidemia   . Hypertension   . Hypothyroid     Electrocardiogram:  09/07/14   SR rate 51  LAD PR 230  LVH  12/04/15 SR rate 57 PR 220 LAFB LVH 04/14/17 SB rate 52 PR 232 PVC LVH   Assessment and Plan CAD" Stable with no angina and good activity level.  Continue medical Rx  PAF: maint NSR with no palpitations   HTN:  Well controlled.  Continue current  medications and low sodium Dash type diet.    Sarcoid:  Stable no cardiac involvement f/u Wert   Dyspnea  Related to asthma / sarcoid EF normal no edema continue inhalers f/u Wert   First Degree Block :  F/u ECG in a year no high grade AV block  Umbilical Hernia: stable small non tender and reducable  Veins: varicose encouraged compression stockings no incidence of phlebitis  DM:  Discussed low carb diet.  Target hemoglobin A1c is 6.5 or less.  Continue current medications. A1c 6.4 07/25/15    Charlton HawsPeter Uva Runkel

## 2017-04-14 ENCOUNTER — Ambulatory Visit (INDEPENDENT_AMBULATORY_CARE_PROVIDER_SITE_OTHER): Payer: Medicare Other | Admitting: Cardiovascular Disease

## 2017-04-14 ENCOUNTER — Encounter: Payer: Self-pay | Admitting: Cardiovascular Disease

## 2017-04-14 VITALS — BP 152/74 | HR 54 | Ht 68.0 in | Wt 171.5 lb

## 2017-04-14 DIAGNOSIS — I2583 Coronary atherosclerosis due to lipid rich plaque: Secondary | ICD-10-CM

## 2017-04-14 DIAGNOSIS — I2510 Atherosclerotic heart disease of native coronary artery without angina pectoris: Secondary | ICD-10-CM

## 2017-04-14 NOTE — Patient Instructions (Addendum)

## 2017-05-11 ENCOUNTER — Ambulatory Visit: Payer: Medicare Other | Admitting: Podiatry

## 2017-05-11 ENCOUNTER — Encounter: Payer: Self-pay | Admitting: Podiatry

## 2017-05-11 ENCOUNTER — Ambulatory Visit (INDEPENDENT_AMBULATORY_CARE_PROVIDER_SITE_OTHER): Payer: Medicare Other | Admitting: Podiatry

## 2017-05-11 ENCOUNTER — Ambulatory Visit: Payer: Medicare Other | Admitting: Orthotics

## 2017-05-11 DIAGNOSIS — M79675 Pain in left toe(s): Secondary | ICD-10-CM

## 2017-05-11 DIAGNOSIS — B351 Tinea unguium: Secondary | ICD-10-CM

## 2017-05-11 DIAGNOSIS — E119 Type 2 diabetes mellitus without complications: Secondary | ICD-10-CM | POA: Diagnosis not present

## 2017-05-11 DIAGNOSIS — M79674 Pain in right toe(s): Secondary | ICD-10-CM

## 2017-05-11 NOTE — Progress Notes (Signed)
Patient ID: Benjamin Mccall, male   DOB: Feb 29, 1930, 84 y.o.   MRN: [id number] Complaint:  Visit Type: Patient returns to my office for continued preventative foot care services. Complaint: Patient states" my nails have grown long and thick and become painful to walk and wear shoes" Patient has been diagnosed with DM with no complications. He presents for preventative foot care services. No changes to ROS.  Patient is taking plavix.  Podiatric Exam: Vascular: dorsalis pedis and posterior tibial pulses are palpable bilateral. Capillary return is immediate. Temperature gradient is WNL. Skin turgor WNL  Sensorium: Normal Semmes Weinstein monofilament test. Normal tactile sensation bilaterally. Nail Exam: Pt has thick disfigured discolored nails with subungual debris noted bilateral entire nail hallux through fifth toenails Ulcer Exam: There is no evidence of ulcer or pre-ulcerative changes or infection. Orthopedic Exam: Muscle tone and strength are WNL. No limitations in general ROM. No crepitus or effusions noted. Foot type and digits show no abnormalities. Bony prominences are unremarkable. Skin: No Porokeratosis. No infection or ulcers  Diagnosis:  Tinea unguium, Pain in right toe, pain in left toes  Treatment & Plan Procedures and Treatment: Consent by patient was obtained for treatment procedures. The patient understood the discussion of treatment and procedures well. All questions were answered thoroughly reviewed. Debridement of mycotic and hypertrophic toenails, 1 through 5 bilateral and clearing of subungual debris. No ulceration, no infection noted. ABN signed for 2019. Return Visit-Office Procedure: Patient instructed to return to the office for a follow up visit 3 months for continued evaluation and treatment.   Benjamin Mccall DPM

## 2017-05-24 MED FILL — SHINGRIX VIAL KIT: 50 | 1 days supply | Qty: 1 | Fill #1

## 2017-08-05 LAB — HM DIABETES EYE EXAM

## 2017-08-09 ENCOUNTER — Encounter: Payer: Self-pay | Admitting: Nurse Practitioner

## 2017-08-10 ENCOUNTER — Other Ambulatory Visit: Payer: Self-pay

## 2017-08-10 ENCOUNTER — Emergency Department (HOSPITAL_COMMUNITY): Payer: Medicare Other

## 2017-08-10 ENCOUNTER — Encounter (HOSPITAL_COMMUNITY): Payer: Self-pay | Admitting: Internal Medicine

## 2017-08-10 ENCOUNTER — Observation Stay (HOSPITAL_COMMUNITY)
Admission: EM | Admit: 2017-08-10 | Discharge: 2017-08-11 | Disposition: A | Discharge status: Home / Self Care | Payer: Medicare Other | Attending: Internal Medicine | Admitting: Internal Medicine

## 2017-08-10 DIAGNOSIS — Z7984 Long term (current) use of oral hypoglycemic drugs: Secondary | ICD-10-CM | POA: Insufficient documentation

## 2017-08-10 DIAGNOSIS — E039 Hypothyroidism, unspecified: Secondary | ICD-10-CM | POA: Diagnosis present

## 2017-08-10 DIAGNOSIS — J45909 Unspecified asthma, uncomplicated: Secondary | ICD-10-CM | POA: Insufficient documentation

## 2017-08-10 DIAGNOSIS — E785 Hyperlipidemia, unspecified: Secondary | ICD-10-CM | POA: Diagnosis not present

## 2017-08-10 DIAGNOSIS — I10 Essential (primary) hypertension: Secondary | ICD-10-CM | POA: Diagnosis not present

## 2017-08-10 DIAGNOSIS — Z7982 Long term (current) use of aspirin: Secondary | ICD-10-CM | POA: Diagnosis not present

## 2017-08-10 DIAGNOSIS — I2510 Atherosclerotic heart disease of native coronary artery without angina pectoris: Secondary | ICD-10-CM | POA: Diagnosis not present

## 2017-08-10 DIAGNOSIS — Z87891 Personal history of nicotine dependence: Secondary | ICD-10-CM | POA: Insufficient documentation

## 2017-08-10 DIAGNOSIS — I480 Paroxysmal atrial fibrillation: Secondary | ICD-10-CM | POA: Diagnosis present

## 2017-08-10 DIAGNOSIS — E119 Type 2 diabetes mellitus without complications: Secondary | ICD-10-CM

## 2017-08-10 DIAGNOSIS — R55 Syncope and collapse: Secondary | ICD-10-CM | POA: Diagnosis not present

## 2017-08-10 DIAGNOSIS — J449 Chronic obstructive pulmonary disease, unspecified: Secondary | ICD-10-CM | POA: Diagnosis not present

## 2017-08-10 DIAGNOSIS — Z7902 Long term (current) use of antithrombotics/antiplatelets: Secondary | ICD-10-CM | POA: Diagnosis not present

## 2017-08-10 DIAGNOSIS — Z79899 Other long term (current) drug therapy: Secondary | ICD-10-CM | POA: Diagnosis not present

## 2017-08-10 DIAGNOSIS — K219 Gastro-esophageal reflux disease without esophagitis: Secondary | ICD-10-CM | POA: Diagnosis not present

## 2017-08-10 DIAGNOSIS — I2583 Coronary atherosclerosis due to lipid rich plaque: Secondary | ICD-10-CM | POA: Diagnosis not present

## 2017-08-10 LAB — URINALYSIS, ROUTINE W REFLEX MICROSCOPIC
BILIRUBIN URINE: NEGATIVE
GLUCOSE, UA: NEGATIVE mg/dL
HGB URINE DIPSTICK: NEGATIVE
Ketones, ur: NEGATIVE mg/dL
Leukocytes, UA: NEGATIVE
Nitrite: NEGATIVE
PROTEIN: NEGATIVE mg/dL
Specific Gravity, Urine: 1.009 (ref 1.005–1.030)
pH: 5 (ref 5.0–8.0)

## 2017-08-10 LAB — CBC WITH DIFFERENTIAL/PLATELET
Basophils Absolute: 0 10*3/uL (ref 0.0–0.1)
Basophils Relative: 0 %
EOS PCT: 3 %
Eosinophils Absolute: 0.2 10*3/uL (ref 0.0–0.7)
HCT: 29.9 % — ABNORMAL LOW (ref 39.0–52.0)
Hemoglobin: 9.9 g/dL — ABNORMAL LOW (ref 13.0–17.0)
LYMPHS ABS: 2 10*3/uL (ref 0.7–4.0)
LYMPHS PCT: 28 %
MCH: 28.9 pg (ref 26.0–34.0)
MCHC: 33.1 g/dL (ref 30.0–36.0)
MCV: 87.2 fL (ref 78.0–100.0)
MONO ABS: 0.6 10*3/uL (ref 0.1–1.0)
MONOS PCT: 8 %
Neutro Abs: 4.5 10*3/uL (ref 1.7–7.7)
Neutrophils Relative %: 61 %
PLATELETS: 241 10*3/uL (ref 150–400)
RBC: 3.43 MIL/uL — ABNORMAL LOW (ref 4.22–5.81)
RDW: 15 % (ref 11.5–15.5)
WBC: 7.3 10*3/uL (ref 4.0–10.5)

## 2017-08-10 LAB — I-STAT TROPONIN, ED: Troponin i, poc: 0 ng/mL (ref 0.00–0.08)

## 2017-08-10 LAB — COMPREHENSIVE METABOLIC PANEL
ALK PHOS: 64 U/L (ref 38–126)
ALT: 12 U/L — ABNORMAL LOW (ref 17–63)
AST: 25 U/L (ref 15–41)
Albumin: 3.2 g/dL — ABNORMAL LOW (ref 3.5–5.0)
Anion gap: 10 (ref 5–15)
BILIRUBIN TOTAL: 0.7 mg/dL (ref 0.3–1.2)
BUN: 27 mg/dL — AB (ref 6–20)
CHLORIDE: 102 mmol/L (ref 101–111)
CO2: 24 mmol/L (ref 22–32)
CREATININE: 1.54 mg/dL — AB (ref 0.61–1.24)
Calcium: 8.6 mg/dL — ABNORMAL LOW (ref 8.9–10.3)
GFR calc Af Amer: 46 mL/min — ABNORMAL LOW (ref >60)
GFR calc non Af Amer: 39 mL/min — ABNORMAL LOW (ref >60)
GLUCOSE: 155 mg/dL — AB (ref 65–99)
POTASSIUM: 3.8 mmol/L (ref 3.5–5.1)
Sodium: 136 mmol/L (ref 135–145)
Total Protein: 5.8 g/dL — ABNORMAL LOW (ref 6.5–8.1)

## 2017-08-10 LAB — TROPONIN I: Troponin I: <0.03 ng/mL (ref <0.03)

## 2017-08-10 MED ORDER — ARFORMOTEROL TARTRATE 15 MCG/2ML IN NEBU
15.0000 ug | INHALATION_SOLUTION | Freq: Two times a day (BID) | RESPIRATORY_TRACT | Status: DC
  Administered 2017-08-11: 15 ug via RESPIRATORY_TRACT
  Filled 2017-08-10 (×3): qty 2

## 2017-08-10 MED ORDER — ONDANSETRON HCL 4 MG/2ML IJ SOLN: 4.0000 mg | Freq: Four times a day (QID) | INTRAMUSCULAR | Status: DC | PRN

## 2017-08-10 MED ORDER — VITAMIN D3 25 MCG (1000 UNIT) PO TABS
1000.0000 [IU] | ORAL_TABLET | ORAL | Status: DC
  Administered 2017-08-11: 1000 [IU] via ORAL
  Filled 2017-08-10 (×2): qty 1

## 2017-08-10 MED ORDER — ALBUTEROL SULFATE HFA 108 (90 BASE) MCG/ACT IN AERS: 2.0000 | INHALATION_SPRAY | RESPIRATORY_TRACT | Status: DC | PRN

## 2017-08-10 MED ORDER — SODIUM FLUORIDE 1.1 % DT GEL: 1.0000 [drp] | Freq: Three times a day (TID) | DENTAL | Status: DC

## 2017-08-10 MED ORDER — LEVOTHYROXINE SODIUM 100 MCG PO TABS
100.0000 ug | ORAL_TABLET | ORAL | Status: DC
  Administered 2017-08-11: 100 ug via ORAL
  Filled 2017-08-10 (×2): qty 1

## 2017-08-10 MED ORDER — ONDANSETRON HCL 4 MG PO TABS
4.0000 mg | ORAL_TABLET | Freq: Four times a day (QID) | ORAL | Status: DC | PRN
Start: 2017-08-10 — End: 2017-08-11

## 2017-08-10 MED ORDER — PANCRELIPASE (LIP-PROT-AMYL) 12000-38000 UNITS PO CPEP
36000.0000 [IU] | ORAL_CAPSULE | Freq: Three times a day (TID) | ORAL | Status: DC
  Administered 2017-08-11 (×2): 36000 [IU] via ORAL
  Filled 2017-08-10 (×2): qty 3
  Filled 2017-08-10: qty 1

## 2017-08-10 MED ORDER — GABAPENTIN 300 MG PO CAPS
300.0000 mg | ORAL_CAPSULE | Freq: Every day | ORAL | Status: DC
  Administered 2017-08-11: 300 mg via ORAL
  Filled 2017-08-10: qty 1

## 2017-08-10 MED ORDER — CLOPIDOGREL BISULFATE 75 MG PO TABS
75.0000 mg | ORAL_TABLET | ORAL | Status: DC
  Administered 2017-08-11: 75 mg via ORAL
  Filled 2017-08-10: qty 1

## 2017-08-10 MED ORDER — SODIUM CHLORIDE 0.9% FLUSH
3.0000 mL | Freq: Two times a day (BID) | INTRAVENOUS | Status: DC
  Administered 2017-08-11 (×2): 3 mL via INTRAVENOUS

## 2017-08-10 MED ORDER — SALINE SPRAY 0.65 % NA SOLN
1.0000 | NASAL | Status: DC | PRN
Start: 2017-08-10 — End: 2017-08-11
  Filled 2017-08-10: qty 44

## 2017-08-10 MED ORDER — ALPRAZOLAM 0.25 MG PO TABS: 0.2500 mg | ORAL_TABLET | Freq: Every evening | ORAL | Status: DC | PRN

## 2017-08-10 MED ORDER — INSULIN ASPART 100 UNIT/ML ~~LOC~~ SOLN: 0.0000 [IU] | Freq: Three times a day (TID) | SUBCUTANEOUS | Status: DC

## 2017-08-10 MED ORDER — INSULIN ASPART 100 UNIT/ML ~~LOC~~ SOLN: 0.0000 [IU] | Freq: Every day | SUBCUTANEOUS | Status: DC

## 2017-08-10 MED ORDER — HYDROCORTISONE 1 % EX CREA
1.0000 "application " | TOPICAL_CREAM | Freq: Every day | CUTANEOUS | Status: DC | PRN
  Filled 2017-08-10: qty 28

## 2017-08-10 MED ORDER — ALBUTEROL SULFATE (2.5 MG/3ML) 0.083% IN NEBU: 2.5000 mg | INHALATION_SOLUTION | RESPIRATORY_TRACT | Status: DC | PRN

## 2017-08-10 MED ORDER — SODIUM CHLORIDE 0.9 % IV SOLN
INTRAVENOUS | Status: DC
  Administered 2017-08-11: via INTRAVENOUS

## 2017-08-10 MED ORDER — NITROGLYCERIN 0.4 MG SL SUBL: 0.4000 mg | SUBLINGUAL_TABLET | SUBLINGUAL | Status: DC | PRN

## 2017-08-10 MED ORDER — FLUTICASONE PROPIONATE 50 MCG/ACT NA SUSP
2.0000 | NASAL | Status: DC | PRN
  Filled 2017-08-10: qty 16

## 2017-08-10 MED ORDER — ATORVASTATIN CALCIUM 10 MG PO TABS: 20.0000 mg | ORAL_TABLET | Freq: Every day | ORAL | Status: DC

## 2017-08-10 MED ORDER — EZETIMIBE 10 MG PO TABS
10.0000 mg | ORAL_TABLET | ORAL | Status: DC
  Administered 2017-08-11: 10 mg via ORAL
  Filled 2017-08-10 (×2): qty 1

## 2017-08-10 MED ORDER — ACETAMINOPHEN 325 MG PO TABS
650.0000 mg | ORAL_TABLET | Freq: Four times a day (QID) | ORAL | Status: DC | PRN
  Administered 2017-08-11: 650 mg via ORAL
  Filled 2017-08-10: qty 2

## 2017-08-10 MED ORDER — ENOXAPARIN SODIUM 40 MG/0.4ML ~~LOC~~ SOLN
40.0000 mg | SUBCUTANEOUS | Status: DC
  Administered 2017-08-11: 40 mg via SUBCUTANEOUS
  Filled 2017-08-10 (×2): qty 0.4

## 2017-08-10 MED ORDER — CITALOPRAM HYDROBROMIDE 10 MG PO TABS
10.0000 mg | ORAL_TABLET | ORAL | Status: DC
  Administered 2017-08-11: 10 mg via ORAL
  Filled 2017-08-10: qty 1

## 2017-08-10 MED ORDER — SIMETHICONE 80 MG PO CHEW
125.0000 mg | CHEWABLE_TABLET | Freq: Four times a day (QID) | ORAL | Status: DC | PRN
  Filled 2017-08-10: qty 3

## 2017-08-10 MED ORDER — POLYETHYLENE GLYCOL 3350 17 GM/SCOOP PO POWD
17.0000 g | Freq: Every day | ORAL | Status: DC
  Filled 2017-08-10: qty 255

## 2017-08-10 MED ORDER — DILTIAZEM HCL ER COATED BEADS 180 MG PO CP24
180.0000 mg | ORAL_CAPSULE | ORAL | Status: DC
  Filled 2017-08-10: qty 1

## 2017-08-10 MED ORDER — PANTOPRAZOLE SODIUM 40 MG PO TBEC
40.0000 mg | DELAYED_RELEASE_TABLET | Freq: Every day | ORAL | Status: DC
  Administered 2017-08-11: 40 mg via ORAL
  Filled 2017-08-10: qty 1

## 2017-08-10 MED ORDER — POLYVINYL ALCOHOL 1.4 % OP SOLN
1.0000 [drp] | Freq: Three times a day (TID) | OPHTHALMIC | Status: DC | PRN
  Filled 2017-08-10: qty 15

## 2017-08-10 MED ORDER — LORATADINE 10 MG PO TABS: 10.0000 mg | ORAL_TABLET | Freq: Every day | ORAL | Status: DC | PRN

## 2017-08-10 MED ORDER — FAMOTIDINE 20 MG PO TABS: 20.0000 mg | ORAL_TABLET | Freq: Every day | ORAL | Status: DC

## 2017-08-10 MED ORDER — GUAIFENESIN ER 600 MG PO TB12: 600.0000 mg | ORAL_TABLET | Freq: Two times a day (BID) | ORAL | Status: DC | PRN

## 2017-08-10 MED ORDER — ACETAMINOPHEN 325 MG PO TABS: 650.0000 mg | ORAL_TABLET | Freq: Four times a day (QID) | ORAL | Status: DC | PRN

## 2017-08-10 MED ORDER — ACETAMINOPHEN 650 MG RE SUPP: 650.0000 mg | Freq: Four times a day (QID) | RECTAL | Status: DC | PRN

## 2017-08-10 MED ORDER — ASPIRIN EC 81 MG PO TBEC
81.0000 mg | DELAYED_RELEASE_TABLET | ORAL | Status: DC
  Administered 2017-08-11: 81 mg via ORAL
  Filled 2017-08-10: qty 1

## 2017-08-10 MED ORDER — UMECLIDINIUM BROMIDE 62.5 MCG/INH IN AEPB
1.0000 | INHALATION_SPRAY | Freq: Every day | RESPIRATORY_TRACT | Status: DC
  Filled 2017-08-10: qty 7

## 2017-08-10 NOTE — ED Provider Notes (Signed)
[facility] EMERGENCY DEPARTMENT Provider Note   CSN: [id number] Arrival date & time: 08/10/17  1729     History   Chief Complaint Chief Complaint  Patient presents with  . Weakness    HPI Benjamin Mccall is a 84 y.o. male.  84 year old male with prior history of asthma, atrial fib, diabetes, GERD, hyperlipidemia, and hypertension presents following reported syncopal event.  Patient reports that he does not recall the event.  Patient reports that he now feels fine and is at his baseline.  Family reports the patient had an episode where he was unresponsive and not interactive.  Patient did not fall during this episode.  The episode lasted approximately 5 minutes.  There was no witnessed seizure activity.  Patient did not appear to stop breathing or lose his pulse.  The history is provided by the patient and a relative.  Loss of Consciousness   This is a new problem. The current episode started 1 to 2 hours ago. The problem occurs rarely. The problem has been resolved. He lost consciousness for a period of 1 to 5 minutes. The problem is associated with normal activity. Pertinent negatives include chest pain, focal weakness and light-headedness. He has tried nothing for the symptoms.    Past Medical History:  Diagnosis Date  . Asthma   . Atrial fibrillation (HCC)   . Chest pain   . Diabetes mellitus without complication (HCC)   . GERD (gastroesophageal reflux disease)   . Hernia, hiatal   . Hyperlipidemia   . Hypertension   . Hypothyroid     Patient Active Problem List   Diagnosis Date Noted  . Neck pain 03/10/2017  . Allergic rhinitis 02/18/2017  . Acute right-sided thoracic back pain 08/17/2016  . Medicare annual wellness visit, subsequent 03/24/2016  . Skin lesion of neck 03/24/2016  . Bloating 07/25/2015  . Occasional tremors 07/25/2015  . Abdominal distention 02/18/2015  . Upper airway cough syndrome 12/25/2014  . Acute upper respiratory  infection 07/26/2014  . COPD GOLD II  07/01/2014  . Essential hypertension 04/03/2014  . Rash and nonspecific skin eruption 04/03/2014  . Sarcoidosis 03/13/2014  . Hyperlipidemia 03/13/2014  . Controlled type 2 diabetes mellitus (HCC) 03/13/2014  . Coronary artery disease 03/13/2014  . Atrial fibrillation (HCC) 03/13/2014  . Hypothyroidism 03/13/2014    Past Surgical History:  Procedure Laterality Date  . APPENDECTOMY    . CATARACT EXTRACTION    . HIATAL HERNIA REPAIR    . PERCUTANEOUS CORONARY STENT INTERVENTION (PCI-S)          Home Medications    Prior to Admission medications   Medication Sig Start Date End Date Taking? Authorizing Provider  acetaminophen (TYLENOL) 325 MG tablet Take 650 mg by mouth every 6 (six) hours as needed.   Yes [provider]  albuterol (PROVENTIL HFA;VENTOLIN HFA) 108 (90 BASE) MCG/ACT inhaler Inhale 2 puffs into the lungs every 4 (four) hours as needed for wheezing or shortness of breath.    Yes [provider]  ALPRAZolam (XANAX) 0.25 MG tablet Take 0.25 mg by mouth at bedtime as needed for anxiety.   Yes [provider]  aspirin 81 MG tablet Take 81 mg by mouth every morning.    Yes [provider]  atorvastatin (LIPITOR) 20 MG tablet Take 20 mg by mouth daily at 6 PM.    Yes [provider]  Cholecalciferol (VITAMIN D3) 1000 UNITS CAPS Take 1,000 Units by mouth every morning.  Yes [provider]  citalopram (CELEXA) 10 MG tablet Take 10 mg by mouth every morning.    Yes [provider]  clopidogrel (PLAVIX) 75 MG tablet Take 75 mg by mouth every morning.    Yes [provider]  CREON 12000 units CPEP capsule Take 36,000 mg by mouth 3 (three) times daily.  11/19/15  Yes [provider]  diltiazem (CARDIZEM CD) 240 MG 24 hr capsule Take 240 mg by mouth every morning.    Yes [provider]  ezetimibe (ZETIA) 10 MG tablet Take 1 tablet (10 mg total) by mouth  daily. Patient taking differently: Take 10 mg by mouth every morning.  02/18/15  Yes Veryl Speak, FNP  fluticasone (FLONASE) 50 MCG/ACT nasal spray Place 2 sprays into both nostrils as needed (nasal stuffiness). 02/18/17  Yes Evaristo Bury, NP  gabapentin (NEURONTIN) 300 MG capsule Take 300 mg by mouth at bedtime.   Yes [provider]  Glycopyrrolate-Formoterol (BEVESPI AEROSPHERE) 9-4.8 MCG/ACT AERO Inhale 1 puff into the lungs 2 (two) times daily. 02/23/17  Yes Nyoka Cowden, MD  guaiFENesin (MUCINEX) 600 MG 12 hr tablet Take 600 mg by mouth 2 (two) times daily as needed for cough.    Yes [provider]  hydrochlorothiazide (HYDRODIURIL) 25 MG tablet Take 25 mg by mouth every morning.    Yes [provider]  hydrochlorothiazide (HYDRODIURIL) 25 MG tablet Take 25 mg by mouth as needed. Take one tablet by mouth every day as needed for Edema not to exceed 50 mg per day   Yes [provider]  hydrocortisone cream 1 % Apply 1 application topically daily as needed for itching.   Yes [provider]  Iron-Vitamins (THEREMS H PO) Take 1 tablet by mouth daily.   Yes [provider]  levothyroxine (SYNTHROID, LEVOTHROID) 100 MCG tablet Take 100 mcg by mouth every morning.    Yes [provider]  loratadine (CLARITIN) 10 MG tablet Take 1 tablet (10 mg total) by mouth daily as needed (drainage, drippy nose). 02/18/17  Yes Evaristo Bury, NP  metFORMIN (GLUCOPHAGE) 500 MG tablet Take 500 mg by mouth 2 (two) times daily with a meal.    Yes [provider]  Multiple Vitamins-Minerals (THEREMS-M) TABS Take 1 tablet by mouth daily.   Yes [provider]  nitroGLYCERIN (NITRODUR - DOSED IN MG/24 HR) 0.6 mg/hr patch Apply patch every morning and remove at bedtime   Yes [provider]  nitroGLYCERIN (NITROSTAT) 0.4 MG SL tablet Place 0.4 mg under the tongue every 5 (five) minutes as needed for chest pain.    Yes [provider]  nizatidine (AXID) 150 MG capsule Take 1 capsule by mouth 2 (two) times daily. 04/07/17  Yes [provider]  omeprazole (PRILOSEC) 20 MG capsule Take 20 mg by mouth daily.  04/07/17  Yes [provider]  polyethylene glycol powder (GLYCOLAX/MIRALAX) powder Take 17 g by mouth daily.    Yes [provider]  polyvinyl alcohol (ARTIFICIAL TEARS) 1.4 % ophthalmic solution Place 1 drop into both eyes 3 (three) times daily as needed for dry eyes.   Yes [provider]  simethicone (GAS-X EXTRA STRENGTH) 125 MG chewable tablet Chew 1-2 tablets (125-250 mg total) by mouth 3 (three) times daily with meals. Max 4 tablets daily Patient taking differently: Chew 125-250 mg by mouth 4 (four) times daily as needed (gas). Max 4 tablets daily 02/18/15  Yes Veryl Speak, FNP  sodium  chloride (OCEAN) 0.65 % SOLN nasal spray Place 1 spray into both nostrils as needed for congestion.   Yes [provider]  SF 1.1 % GEL dental gel Place 1 drop onto teeth 3 (three) times daily.  12/10/16   [provider]    Family History Family History  Problem Relation Age of Onset  . Hypertension Mother   . Stroke Mother   . Ovarian cancer Mother   . Stroke Father   . Diabetes Son     Social History Social History   Tobacco Use  . Smoking status: Former Smoker    Packs/day: 0.50    Years: 7.00    Pack years: 3.50    Types: Pipe, Cigars    Last attempt to quit: 04/27/1968    Years since quitting: 49.3  . Smokeless tobacco: Never Used  Substance Use Topics  . Alcohol use: No    Alcohol/week: 0.0 oz  . Drug use: No     Allergies   Reglan [metoclopramide] and Sulfa antibiotics   Review of Systems Review of Systems  Cardiovascular: Positive for syncope. Negative for chest pain.  Neurological: Negative for focal weakness and light-headedness.  All other systems reviewed and are negative.    Physical Exam Updated Vital  Signs BP (!) 161/62   Pulse (!) 42   Resp 11   Ht 5\' 8"  (1.727 m)   Wt 74.8 kg (165 lb)   SpO2 97%   BMI 25.09 kg/m   Physical Exam  Constitutional: He is oriented to person, place, and time. He appears well-developed and well-nourished. No distress.  HENT:  Head: Normocephalic and atraumatic.  Mouth/Throat: Oropharynx is clear and moist.  Eyes: Pupils are equal, round, and reactive to light. Conjunctivae and EOM are normal.  Neck: Normal range of motion. Neck supple.  Cardiovascular: Normal rate, regular rhythm and normal heart sounds.  Pulmonary/Chest: Effort normal and breath sounds normal. No respiratory distress.  Abdominal: Soft. He exhibits no distension. There is no tenderness.  Musculoskeletal: Normal range of motion. He exhibits no edema or deformity.  Neurological: He is alert and oriented to person, place, and time.  Skin: Skin is warm and dry.  Psychiatric: He has a normal mood and affect.  Nursing note and vitals reviewed.    ED Treatments / Results  Labs (all labs ordered are listed, but only abnormal results are displayed) Labs Reviewed  COMPREHENSIVE METABOLIC PANEL - Abnormal; Notable for the following components:      Result Value   Glucose, Bld 155 (*)    BUN 27 (*)    Creatinine, Ser 1.54 (*)    Calcium 8.6 (*)    Total Protein 5.8 (*)    Albumin 3.2 (*)    ALT 12 (*)    GFR calc non Af Amer 39 (*)    GFR calc Af Amer 46 (*)    All other components within normal limits  CBC WITH DIFFERENTIAL/PLATELET - Abnormal; Notable for the following components:   RBC 3.43 (*)    Hemoglobin 9.9 (*)    HCT 29.9 (*)    All other components within normal limits  URINALYSIS, ROUTINE W REFLEX MICROSCOPIC  TROPONIN I  I-STAT TROPONIN, ED    EKG EKG Interpretation  Date/Time:  Tuesday August 10 2017 17:53:50 EDT Ventricular Rate:  49 PR Interval:    QRS Duration: 109 QT Interval:  486 QTC Calculation: 439 R Axis:   -36 Text Interpretation:  Sinus  bradycardia Prolonged PR interval  Left axis deviation Abnormal R-wave progression, early transition Confirmed by Kristine RoyalMessick, Peter [id number]) on 08/10/2017 5:59:10 PM   Radiology Ct Head Wo Contrast  Result Date: 08/10/2017 CLINICAL DATA:  Altered level of consciousness. Bilateral arm weakness. EXAM: CT HEAD WITHOUT CONTRAST TECHNIQUE: Contiguous axial images were obtained from the base of the skull through the vertex without intravenous contrast. COMPARISON:  02/24/2017 FINDINGS: Brain: There is no evidence of acute infarct, intracranial hemorrhage, mass, midline shift, or extra-axial fluid collection. Cerebral atrophy is unchanged and not greater than expected for patient's age. Periventricular white matter hypodensities are similar to the prior study and nonspecific but compatible with minimal chronic small vessel ischemic disease, also not greater than expected for age. Vascular: Calcified atherosclerosis at the skull base. No hyperdense vessel. Skull: No fracture or suspicious osseous lesion. Sinuses/Orbits: Visualized paranasal sinuses and mastoid air cells are clear. Bilateral cataract extraction is noted. Other: None. IMPRESSION: No evidence of acute intracranial abnormality. Electronically Signed   By: Sebastian AcheAllen  Grady M.D.   On: 08/10/2017 19:40    Procedures Procedures (including critical care time)  Medications Ordered in ED Medications - No data to display   Initial Impression / Assessment and Plan / ED Course  I have reviewed the triage vital signs and the nursing notes.  Pertinent labs & imaging results that were available during my care of the patient were reviewed by me and considered in my medical decision making (see chart for details).     MDM  Screen complete  Patient is presenting following a possible syncopal event.  Screening exam does not suggest significant acute pathology.  Screening labs are without significant findings.  EKG does not suggest acute ischemia.  CT brain does  not show acute process.  Patient will be admitted for further monitoring and workup to the medicine service.  Final Clinical Impressions(s) / ED Diagnoses   Final diagnoses:  Syncope, unspecified syncope type    ED Discharge Orders    None       Wynetta FinesMessick, Peter C, MD 08/10/17 2236

## 2017-08-10 NOTE — ED Triage Notes (Signed)
Per ems pt live in [place] and was having some type of focal issue and was not responding to med tech nor wife. Upon coming to pt began to get aggitated and then Had tingling in both arms. Pt does not remember anything. Pt is alert oriented x 4. Pt stated he does have weakness in arms but getting better. 140/60, 97%, cbg 205, HX a-fib, hr 50;s no pain.

## 2017-08-10 NOTE — ED Notes (Signed)
Messaged pharmacy to verify all meds and send to ED

## 2017-08-10 NOTE — ED Notes (Signed)
ED Provider at bedside. 

## 2017-08-10 NOTE — H&P (Addendum)
History and Physical    Benjamin Mccall:[id number] DOB: November 10, 1929 DOA: 08/10/2017  Referring MD/NP/PA:   PCP: Evaristo Bury, NP   Patient coming from:  The patient is coming from ALF.  At baseline, pt is dependent for most of ADL.  Chief Complaint: syncope  HPI: Benjamin Mccall is a 84 y.o. male with medical history significant of hypertension, hyperlipidemia, diabetes mellitus, COPD, GERD, hypothyroidism, depression, atrial fibrillation not on anticoagulants, CAD, stent placement, CKD-III, who presents with syncope.  Pt states that he suddenly had funny, tingling-like felling in his lower abdomen at about 4:30 PM. The feeling moved upper to his upper body. He was then reportedly lost consciousness. Not sure for how hong. When I saw pt in ED, he states that he feel normal. No any complains. Patient denies unilateral weakness, numbness or tingling to extremities. No facial droop, slurred speech. Denies chest pain, shortness breath, cough, fever or chills. No nausea, vomiting, diarrhea or abdominal pain. No symptoms of UTI.  ED Course: pt was found to have WBC 17.3, negative troponin, negative urinalysis, stable renal function, temperature normal, bradycardia, no tachypnea, O2 sat. 97% on room air, no fever, CT head is negative for acute intracranial abnormalities. Patient is placed on telemetry bed for observation.  Review of Systems:   General: no fevers, chills, no body weight gain, fatigue HEENT: no blurry vision, hearing changes or sore throat Respiratory: no dyspnea, coughing, wheezing CV: no chest pain, no palpitations GI: no nausea, vomiting, abdominal pain, diarrhea, constipation GU: no dysuria, burning on urination, increased urinary frequency, hematuria  Ext: no leg edema Neuro: no unilateral weakness, numbness, or tingling, no vision change or hearing loss. Had syncope. Skin: no rash, no skin tear. MSK: No muscle spasm, no deformity, no limitation of range of  movement in spin Heme: No easy bruising.  Travel history: No recent long distant travel.  Allergy:  Allergies  Allergen Reactions  . Reglan [Metoclopramide]     Tremors   . Sulfa Antibiotics Other (See Comments)    shakes    Past Medical History:  Diagnosis Date  . Asthma   . Atrial fibrillation (HCC)   . Chest pain   . Diabetes mellitus without complication (HCC)   . GERD (gastroesophageal reflux disease)   . Hernia, hiatal   . Hyperlipidemia   . Hypertension   . Hypothyroid     Past Surgical History:  Procedure Laterality Date  . APPENDECTOMY    . CATARACT EXTRACTION    . HIATAL HERNIA REPAIR    . PERCUTANEOUS CORONARY STENT INTERVENTION (PCI-S)      Social History:  reports that he quit smoking about 49 years ago. His smoking use included pipe and cigars. He has a 3.50 pack-year smoking history. He has never used smokeless tobacco. He reports that he does not drink alcohol or use drugs.  Family History:  Family History  Problem Relation Age of Onset  . Hypertension Mother   . Stroke Mother   . Ovarian cancer Mother   . Stroke Father   . Diabetes Son      Prior to Admission medications   Medication Sig Start Date End Date Taking? Authorizing Provider  acetaminophen (TYLENOL) 325 MG tablet Take 650 mg by mouth every 6 (six) hours as needed.   Yes [provider]  albuterol (PROVENTIL HFA;VENTOLIN HFA) 108 (90 BASE) MCG/ACT inhaler Inhale 2 puffs into the lungs every 4 (four) hours as needed for wheezing or shortness of breath.  Yes [provider]  ALPRAZolam (XANAX) 0.25 MG tablet Take 0.25 mg by mouth at bedtime as needed for anxiety.   Yes [provider]  aspirin 81 MG tablet Take 81 mg by mouth every morning.    Yes [provider]  atorvastatin (LIPITOR) 20 MG tablet Take 20 mg by mouth daily at 6 PM.    Yes [provider]  Cholecalciferol (VITAMIN D3) 1000 UNITS CAPS Take 1,000 Units by mouth every morning.     Yes [provider]  citalopram (CELEXA) 10 MG tablet Take 10 mg by mouth every morning.    Yes [provider]  clopidogrel (PLAVIX) 75 MG tablet Take 75 mg by mouth every morning.    Yes [provider]  CREON 12000 units CPEP capsule Take 36,000 mg by mouth 3 (three) times daily.  11/19/15  Yes [provider]  diltiazem (CARDIZEM CD) 240 MG 24 hr capsule Take 240 mg by mouth every morning.    Yes [provider]  ezetimibe (ZETIA) 10 MG tablet Take 1 tablet (10 mg total) by mouth daily. Patient taking differently: Take 10 mg by mouth every morning.  02/18/15  Yes Veryl Speak, FNP  fluticasone (FLONASE) 50 MCG/ACT nasal spray Place 2 sprays into both nostrils as needed (nasal stuffiness). 02/18/17  Yes Evaristo Bury, NP  gabapentin (NEURONTIN) 300 MG capsule Take 300 mg by mouth at bedtime.   Yes [provider]  Glycopyrrolate-Formoterol (BEVESPI AEROSPHERE) 9-4.8 MCG/ACT AERO Inhale 1 puff into the lungs 2 (two) times daily. 02/23/17  Yes Nyoka Cowden, MD  guaiFENesin (MUCINEX) 600 MG 12 hr tablet Take 600 mg by mouth 2 (two) times daily as needed for cough.    Yes [provider]  hydrochlorothiazide (HYDRODIURIL) 25 MG tablet Take 25 mg by mouth every morning.    Yes [provider]  hydrochlorothiazide (HYDRODIURIL) 25 MG tablet Take 25 mg by mouth as needed. Take one tablet by mouth every day as needed for Edema not to exceed 50 mg per day   Yes [provider]  hydrocortisone cream 1 % Apply 1 application topically daily as needed for itching.   Yes [provider]  Iron-Vitamins (THEREMS H PO) Take 1 tablet by mouth daily.   Yes [provider]  levothyroxine (SYNTHROID, LEVOTHROID) 100 MCG tablet Take 100 mcg by mouth every morning.    Yes [provider]  loratadine (CLARITIN) 10 MG tablet Take 1 tablet (10 mg total) by mouth daily as needed (drainage, drippy  nose). 02/18/17  Yes Evaristo Bury, NP  metFORMIN (GLUCOPHAGE) 500 MG tablet Take 500 mg by mouth 2 (two) times daily with a meal.    Yes [provider]  Multiple Vitamins-Minerals (THEREMS-M) TABS Take 1 tablet by mouth daily.   Yes [provider]  nitroGLYCERIN (NITRODUR - DOSED IN MG/24 HR) 0.6 mg/hr patch Apply patch every morning and remove at bedtime   Yes [provider]  nitroGLYCERIN (NITROSTAT) 0.4 MG SL tablet Place 0.4 mg under the tongue every 5 (five) minutes as needed for chest pain.   Yes [provider]  nizatidine (AXID) 150 MG capsule Take 1 capsule by mouth 2 (two) times daily. 04/07/17  Yes [provider]  omeprazole (PRILOSEC) 20 MG capsule Take 20 mg by mouth daily.  04/07/17  Yes [provider]  polyethylene glycol powder (GLYCOLAX/MIRALAX) powder Take 17 g by mouth daily.    Yes [provider]  polyvinyl alcohol (ARTIFICIAL TEARS) 1.4 % ophthalmic solution Place 1 drop into both eyes 3 (three) times daily as needed for dry eyes.   Yes [provider]  simethicone (GAS-X EXTRA STRENGTH) 125 MG chewable tablet Chew 1-2 tablets (125-250 mg total) by mouth 3 (three) times daily with meals. Max 4 tablets daily Patient taking differently: Chew 125-250 mg by mouth 4 (four) times daily as needed (gas). Max 4 tablets daily 02/18/15  Yes Veryl Speakalone, Gregory D, FNP  sodium chloride (OCEAN) 0.65 % SOLN nasal spray Place 1 spray into both nostrils as needed for congestion.   Yes [provider]  SF 1.1 % GEL dental gel Place 1 drop onto teeth 3 (three) times daily.  12/10/16   [provider]    Physical Exam: Vitals:   08/11/17 0030 08/11/17 0100 08/11/17 0154 08/11/17 0300  BP: (!) 171/67 (!) 190/77  (!) 184/71  Pulse: (!) 48 (!) 52  (!) 49  Resp: 13 15  14   Temp:  98.3 F (36.8 C)  98.1 F (36.7 C)  TempSrc:  Oral  Oral  SpO2: 95% 97%  96%  Weight:   74.8 kg (164 lb 14.5 oz)     Height:       General: Not in acute distress HEENT:       Eyes: PERRL, EOMI, no scleral icterus.       ENT: No discharge from the ears and nose, no pharynx injection, no tonsillar enlargement.        Neck: No JVD, no bruit, no mass felt. Heme: No neck lymph node enlargement. Cardiac: S1/S2, RRR, No murmurs, No gallops or rubs. Respiratory: No rales, wheezing, rhonchi or rubs. GI: Soft, nondistended, nontender, no rebound pain, no organomegaly, BS present. GU: No hematuria Ext: No pitting leg edema bilaterally. 2+DP/PT pulse bilaterally. Musculoskeletal: No joint deformities, No joint redness or warmth, no limitation of ROM in spin. Skin: No rashes.  Neuro: Alert, oriented X3, cranial nerves II-XII grossly intact, moves all extremities normally. Muscle strength 5/5 in all extremities, sensation to light touch intact. Brachial reflex 2+ bilaterally. Negative Babinski's sign. Normal finger to nose test. Psych: Patient is not psychotic, no suicidal or hemocidal ideation.  Labs on Admission: I have personally reviewed following labs and imaging studies  CBC: Recent Labs  Lab 08/10/17 1822  WBC 7.3  NEUTROABS 4.5  HGB 9.9*  HCT 29.9*  MCV 87.2  PLT 241   Basic Metabolic Panel: Recent Labs  Lab 08/10/17 1822  NA 136  K 3.8  CL 102  CO2 24  GLUCOSE 155*  BUN 27*  CREATININE 1.54*  CALCIUM 8.6*   GFR: Estimated Creatinine Clearance: 33.9 mL/min (A) (by C-G formula based on SCr of 1.54 mg/dL (H)). Liver Function Tests: Recent Labs  Lab 08/10/17 1822  AST 25  ALT 12*  ALKPHOS 64  BILITOT 0.7  PROT 5.8*  ALBUMIN 3.2*   No results for input(s): LIPASE, AMYLASE in the last 168 hours. No results for input(s): AMMONIA in the last 168 hours. Coagulation Profile: No results for input(s): INR, PROTIME in the last 168 hours. Cardiac Enzymes: Recent Labs  Lab 08/10/17 1822  TROPONINI <0.03   BNP (last 3 results) No results for input(s): PROBNP in the last 8760  hours. HbA1C: No results for input(s): HGBA1C in the last 72 hours. CBG: Recent Labs  Lab 08/11/17 0009  GLUCAP 112*   Lipid Profile: No results for input(s): CHOL, HDL, LDLCALC, TRIG, CHOLHDL, LDLDIRECT in the last  72 hours. Thyroid Function Tests: No results for input(s): TSH, T4TOTAL, FREET4, T3FREE, THYROIDAB in the last 72 hours. Anemia Panel: No results for input(s): VITAMINB12, FOLATE, FERRITIN, TIBC, IRON, RETICCTPCT in the last 72 hours. Urine analysis:    Component Value Date/Time   COLORURINE YELLOW 08/10/2017 1846   APPEARANCEUR CLEAR 08/10/2017 1846   LABSPEC 1.009 08/10/2017 1846   PHURINE 5.0 08/10/2017 1846   GLUCOSEU NEGATIVE 08/10/2017 1846   HGBUR NEGATIVE 08/10/2017 1846   BILIRUBINUR NEGATIVE 08/10/2017 1846   KETONESUR NEGATIVE 08/10/2017 1846   PROTEINUR NEGATIVE 08/10/2017 1846   UROBILINOGEN 1.0 02/14/2015 2232   NITRITE NEGATIVE 08/10/2017 1846   LEUKOCYTESUR NEGATIVE 08/10/2017 1846   Sepsis Labs: @LABRCNTIP (procalcitonin:4,lacticidven:4) )No results found for this or any previous visit (from the past 240 hour(s)).   Radiological Exams on Admission: Ct Head Wo Contrast  Result Date: 08/10/2017 CLINICAL DATA:  Altered level of consciousness. Bilateral arm weakness. EXAM: CT HEAD WITHOUT CONTRAST TECHNIQUE: Contiguous axial images were obtained from the base of the skull through the vertex without intravenous contrast. COMPARISON:  02/24/2017 FINDINGS: Brain: There is no evidence of acute infarct, intracranial hemorrhage, mass, midline shift, or extra-axial fluid collection. Cerebral atrophy is unchanged and not greater than expected for patient's age. Periventricular white matter hypodensities are similar to the prior study and nonspecific but compatible with minimal chronic small vessel ischemic disease, also not greater than expected for age. Vascular: Calcified atherosclerosis at the skull base. No hyperdense vessel. Skull: No fracture or suspicious  osseous lesion. Sinuses/Orbits: Visualized paranasal sinuses and mastoid air cells are clear. Bilateral cataract extraction is noted. Other: None. IMPRESSION: No evidence of acute intracranial abnormality. Electronically Signed   By: Sebastian Ache M.D.   On: 08/10/2017 19:40     EKG: Independently reviewed.  Sinus rhythm, QTC 439, LAD, early R-wave progression    Assessment/Plan Principal Problem:   Syncope Active Problems:   Hyperlipidemia   Coronary artery disease   Hypothyroidism   Essential hypertension   COPD GOLD II    GERD (gastroesophageal reflux disease)   Diabetes mellitus without complication (HCC)   PAF (paroxysmal atrial fibrillation) (HCC)   Syncope: etiology is not clear. CT of the head is negative for acute intracranial abnormalities. Differential diagnosis include severe bradycardia, cardiac arrhythmia, TIA, seizure or vasovagal syncope.   - Place on tele bed for obs - Orthostatic vital signs  - MRI-brain - EEG - 2d echo - Neuro checks  - IVF: NS 100 cc/h - no need for PT/OT since pt feel normal now  PAF: CHA2DS2-VASc Score is 5, needs oral anticoagulation, but pt is not on AC, possibly due to GIB (per his wife, pt had hx of GIB). Pt has bradycardia with heart rates 40-50s. -decreased his Cardizem dose from 24o to 120 mg daily  HLD; -Lipitor and Zetia  CAD: s/p of stent. No CP. -ASA, plavix and lipitor, zetia  Hypothyroidism: Last TSH was 0.12 on 01/23/15 -Continue home Synthroid  HTN:  -continue cardizem - hold HCTZ since pt is on IVF -IV hydralazine prn  GERD: -Protonix and Pepcid  Diabetes mellitus without complication: Last A1c6.3 on 01/23/16, well controled. Patient is taking metformin at home -SSI  COPD GOLD II: stable -continue bronchodilators  DVT ppx: SQ Lovenox Code Status: Full code Family Communication: Yes, patient's wife, son and daughter-in-law   at bed side Disposition Plan:  Anticipate discharge back to previous  ALF Consults called: none  Admission status: Obs / tele    Date of  Service 08/11/2017    Lorretta Harp [facility] Pager [phone]  If 7PM-7AM, please contact night-coverage [email or web address] Password [facility] 08/11/2017, 3:48 AM

## 2017-08-11 ENCOUNTER — Observation Stay (HOSPITAL_COMMUNITY): Payer: Medicare Other

## 2017-08-11 ENCOUNTER — Other Ambulatory Visit: Payer: Self-pay

## 2017-08-11 ENCOUNTER — Encounter (HOSPITAL_COMMUNITY): Payer: Self-pay | Admitting: *Deleted

## 2017-08-11 ENCOUNTER — Ambulatory Visit: Payer: Medicare Other | Admitting: Podiatry

## 2017-08-11 DIAGNOSIS — I480 Paroxysmal atrial fibrillation: Secondary | ICD-10-CM | POA: Diagnosis present

## 2017-08-11 DIAGNOSIS — I10 Essential (primary) hypertension: Secondary | ICD-10-CM | POA: Diagnosis not present

## 2017-08-11 DIAGNOSIS — R001 Bradycardia, unspecified: Secondary | ICD-10-CM | POA: Diagnosis not present

## 2017-08-11 DIAGNOSIS — R55 Syncope and collapse: Secondary | ICD-10-CM

## 2017-08-11 DIAGNOSIS — I5032 Chronic diastolic (congestive) heart failure: Secondary | ICD-10-CM

## 2017-08-11 LAB — CBC
HCT: 32.7 % — ABNORMAL LOW (ref 39.0–52.0)
HEMOGLOBIN: 10.7 g/dL — AB (ref 13.0–17.0)
MCH: 28.5 pg (ref 26.0–34.0)
MCHC: 32.7 g/dL (ref 30.0–36.0)
MCV: 87 fL (ref 78.0–100.0)
PLATELETS: 255 10*3/uL (ref 150–400)
RBC: 3.76 MIL/uL — ABNORMAL LOW (ref 4.22–5.81)
RDW: 14.6 % (ref 11.5–15.5)
WBC: 6.9 10*3/uL (ref 4.0–10.5)

## 2017-08-11 LAB — GLUCOSE, CAPILLARY
Glucose-Capillary: 92 mg/dL (ref 65–99)
Glucose-Capillary: 110 mg/dL — ABNORMAL HIGH (ref 65–99)
Glucose-Capillary: 106 mg/dL — ABNORMAL HIGH (ref 65–99)

## 2017-08-11 LAB — CBG MONITORING, ED: GLUCOSE-CAPILLARY: 112 mg/dL — AB (ref 65–99)

## 2017-08-11 LAB — BASIC METABOLIC PANEL
Anion gap: 7 (ref 5–15)
BUN: 19 mg/dL (ref 6–20)
CALCIUM: 9 mg/dL (ref 8.9–10.3)
CO2: 26 mmol/L (ref 22–32)
Chloride: 105 mmol/L (ref 101–111)
Creatinine, Ser: 1.37 mg/dL — ABNORMAL HIGH (ref 0.61–1.24)
GFR calc Af Amer: 53 mL/min — ABNORMAL LOW (ref >60)
GFR calc non Af Amer: 45 mL/min — ABNORMAL LOW (ref >60)
GLUCOSE: 153 mg/dL — AB (ref 65–99)
Potassium: 3.8 mmol/L (ref 3.5–5.1)
Sodium: 138 mmol/L (ref 135–145)

## 2017-08-11 LAB — BRAIN NATRIURETIC PEPTIDE: B NATRIURETIC PEPTIDE 5: 495.5 pg/mL — AB (ref 0.0–100.0)

## 2017-08-11 LAB — MRSA PCR SCREENING: MRSA by PCR: NEGATIVE

## 2017-08-11 MED ORDER — DILTIAZEM HCL ER COATED BEADS 120 MG PO CP24
120.0000 mg | ORAL_CAPSULE | ORAL | Status: DC
  Administered 2017-08-11: 120 mg via ORAL
  Filled 2017-08-11 (×2): qty 1

## 2017-08-11 MED ORDER — HYDRALAZINE HCL 20 MG/ML IJ SOLN
10.0000 mg | Freq: Four times a day (QID) | INTRAMUSCULAR | Status: DC | PRN
  Administered 2017-08-11: 10 mg via INTRAVENOUS
  Filled 2017-08-11: qty 1

## 2017-08-11 MED ORDER — HYDRALAZINE HCL 25 MG PO TABS
25.0000 mg | ORAL_TABLET | Freq: Three times a day (TID) | ORAL | Status: DC
  Administered 2017-08-11: 25 mg via ORAL
  Filled 2017-08-11: qty 1

## 2017-08-11 MED ORDER — HYDRALAZINE HCL 25 MG PO TABS: 25.0000 mg | ORAL_TABLET | Freq: Three times a day (TID) | ORAL | 0 refills | Status: DC

## 2017-08-11 MED ORDER — DILTIAZEM HCL ER COATED BEADS 120 MG PO CP24: 120.0000 mg | ORAL_CAPSULE | ORAL | 1 refills | Status: DC

## 2017-08-11 MED ORDER — FUROSEMIDE 10 MG/ML IJ SOLN
20.0000 mg | Freq: Once | INTRAMUSCULAR | Status: AC
  Administered 2017-08-11: 20 mg via INTRAVENOUS
  Filled 2017-08-11: qty 4

## 2017-08-11 MED ORDER — POLYETHYLENE GLYCOL 3350 17 G PO PACK
17.0000 g | PACK | Freq: Every day | ORAL | Status: DC
  Administered 2017-08-11: 17 g via ORAL
  Filled 2017-08-11: qty 1

## 2017-08-11 NOTE — Procedures (Signed)
ELECTROENCEPHALOGRAM REPORT  Date of Study: 08/11/2017  Patient's Name: Benjamin Mccall MRN: [id number] Date of Birth: [birth year]  Referring Provider: Dr. Lorretta HarpXilin Niu  Clinical History: This is an 84 year old man with syncope.  Medications: Neurontin  Technical Summary: A multichannel digital EEG recording measured by the international 10-20 system with electrodes applied with paste and impedances below 5000 ohms performed in our laboratory with EKG monitoring in an awake and asleep patient.  Hyperventilation was not performed. Photic stimulation was performed.  The digital EEG was referentially recorded, reformatted, and digitally filtered in a variety of bipolar and referential montages for optimal display.    Description: The patient is awake and asleep during the recording.  During maximal wakefulness, there is a symmetric, medium voltage 8 Hz posterior dominant rhythm that attenuates with eye opening.  The record is symmetric.  During drowsiness and sleep, there is an increase in theta slowing of the background.  Vertex waves and symmetric sleep spindles were seen.  Photic stimulation did not elicit any abnormalities.  There were no epileptiform discharges or electrographic seizures seen.    EKG lead was unremarkable.  Impression: This awake and asleep EEG is normal.    Clinical Correlation: A normal EEG does not exclude a clinical diagnosis of epilepsy. Clinical correlation is advised.   Patrcia DollyKaren Rainey Rodger, M.D.

## 2017-08-11 NOTE — Progress Notes (Signed)
Unable to complete Q2 vitals due at 5am due to patient being off the floor in MRI at the time.

## 2017-08-11 NOTE — Care Management Note (Signed)
Case Management Note  Patient Details  Name: Benjamin Mccall MRN: [id number] Date of Birth: 1929-05-25  Subjective/Objective:     Pt in with syncope. He is from [facility].               Action/Plan: Currently the plan is for patient to return to [facility]. MD please orders PT/OT as needed. CM following.   Expected Discharge Date:                  Expected Discharge Plan:  Assisted Living / Rest Home  In-House Referral:  Clinical Social Work  Discharge planning Services     Post Acute Care Choice:    Choice offered to:     DME Arranged:    DME Agency:     HH Arranged:    HH Agency:     Status of Service:  In process, will continue to follow  If discussed at Long Length of Stay Meetings, dates discussed:    Additional Comments:  Kermit BaloKelli F Nayan Proch, RN 08/11/2017, 11:37 AM

## 2017-08-11 NOTE — Care Management Note (Signed)
Case Management Note  Patient Details  Name: Manson PasseyDonald R Swango MRN: [id number] Date of Birth: 1929/12/01  Subjective/Objective:                    Action/Plan: Patient discharging back to [facility]. Pts son to provide transportation. CSW updated. CM signing off.   Expected Discharge Date:  08/11/17               Expected Discharge Plan:  Assisted Living / Rest Home  In-House Referral:  Clinical Social Work  Discharge planning Services     Post Acute Care Choice:    Choice offered to:     DME Arranged:    DME Agency:     HH Arranged:    HH Agency:     Status of Service:  Completed, signed off  If discussed at [employer] Meetings, dates discussed:    Additional Comments:  Kermit BaloKelli F Cleveland Paiz, RN 08/11/2017, 3:00 PM

## 2017-08-11 NOTE — ED Notes (Signed)
Report attempted x 1

## 2017-08-11 NOTE — Discharge Summary (Signed)
Discharge Summary  Benjamin Mccall:[id number] DOB: 04-25-1930  PCP: Evaristo Bury, NP  Admit date: 08/10/2017 Discharge date: 08/11/2017  Time spent: 25 minutes  Recommendations for Outpatient Follow-up:  1. Medication change: Cardizem decreased from 240 mg p.o. daily to 120 mg p.o. daily for bradycardia 2. New medication: Hydralazine 25 mg p.o. 3 times daily 3. Patient will follow up with his PCP in the next few weeks for blood pressure check and to ensure heart rate stable  Discharge Diagnoses:  Active Hospital Problems   Diagnosis Date Noted  . Syncope 08/10/2017  . PAF (paroxysmal atrial fibrillation) (HCC) 08/11/2017  . GERD (gastroesophageal reflux disease)   . Diabetes mellitus without complication (HCC)   . COPD GOLD II  07/01/2014  . Essential hypertension 04/03/2014  . Hypothyroidism 03/13/2014  . Hyperlipidemia 03/13/2014  . Coronary artery disease 03/13/2014    Resolved Hospital Problems  No resolved problems to display.    Discharge Condition: improved, being discharged back to assisted living  Diet recommendation: Low-sodium  Vitals:   08/11/17 1101 08/11/17 1357  BP: (!) 157/67 (!) 175/68  Pulse: (!) 47 (!) 52  Resp: 17 18  Temp: 98 F (36.7 C) 99.2 F (37.3 C)  SpO2: 98% 95%    History of present illness:  84 year old male with past medical history of essential hypertension, paroxysmal atrial fibrillation not on chronic anticoagulation, stage III chronic kidney disease and chronic diastolic heart failure brought into the emergency room on 4/16 after having a syncopal event.  Patient states he felt funny and tingly in the evening and then reportedly lost consciousness.  There were no jerking like movements or anything else.  In the emergency room, patient was found to not be orthostatic.  Renal function within normal limits.  He was noted to be bradycardic with a heart rate in the 40s.:  Workup unrevealing.  EEG unremarkable.  Hospital  Course:  Principal Problem:   Syncope: workup unrevealing.  EEG unremarkable.  Echocardiogram noted diastolic dysfunction.  Evaluation of patient, I suspect that the cause of syncope is bradycardia caused by Cardizem.  Cardizem dose decreased to half strength.  Advised patient that he is not to drive and to limit activity for the next few days until decreased dose of Cardizem more in his system and heart rate improved. Active Problems:   Hyperlipidemia   Coronary artery disease: Stable   Hypothyroidism: Continue Synthroid   Essential hypertension: Markedly elevated blood pressure as below.  Have added hydralazine to his home regimen.  He will follow-up with his PCP   COPD GOLD II    GERD (gastroesophageal reflux disease)   Diabetes mellitus without complication (HCC): Stable blood sugars.  Not hypoglycemic on admission.  Continue home medications   PAF (paroxysmal atrial fibrillation) (HCC): Chads 2 score of 5.  Not on anticoagulation because of previous history of GI bleed.  Currently actually in sinus rhythm.  See above as for Cardizem change Chronic diastolic heart failure: Patient not dehydrated on admission, but given syncope, he was given fluids.  Blood pressure continued to elevate as high as 180s-190 systolic.  Never lower than 150s.  BNP checked on day of discharge at 495.  Patient given 2 doses of IV Lasix to prevent volume overload.  Have added hydralazine as well.  I am cautious about ACE inhibitor given his renal dysfunction.   Procedures:  EEG: No evidence of epilepsy  Echocardiogram: Preserved ejection fraction, diastolic dysfunction  Consultations:  None  Discharge Exam: BP [name])  175/68 (BP Location: Left Arm)   Pulse (!) 52   Temp 99.2 F (37.3 C) (Oral)   Resp 18   Ht 5\' 8"  (1.727 m)   Wt 74.8 kg (164 lb 14.5 oz)   SpO2 95%   BMI 25.07 kg/m   General: Alert and oriented x3, no acute distress Cardiovascular: Bradycardic, regular rhythm  Respiratory:  clear to  auscultation bilaterally  Discharge Instructions You were cared for by a hospitalist during your hospital stay. If you have any questions about your discharge medications or the care you received while you were in the hospital after you are discharged, you can call the unit and asked to speak with the hospitalist on call if the hospitalist that took care of you is not available. Once you are discharged, your primary care physician will handle any further medical issues. Please note that NO REFILLS for any discharge medications will be authorized once you are discharged, as it is imperative that you return to your primary care physician (or establish a relationship with a primary care physician if you do not have one) for your aftercare needs so that they can reassess your need for medications and monitor your lab values.  Discharge Instructions    Diet - low sodium heart healthy   Complete by:  As directed    Increase activity slowly   Complete by:  As directed      Allergies as of 08/11/2017      Reactions   Reglan [metoclopramide]    Tremors   Sulfa Antibiotics Other (See Comments)   shakes      Medication List    TAKE these medications   acetaminophen 325 MG tablet Commonly known as:  TYLENOL Take 650 mg by mouth every 6 (six) hours as needed.   albuterol 108 (90 Base) MCG/ACT inhaler Commonly known as:  PROVENTIL HFA;VENTOLIN HFA Inhale 2 puffs into the lungs every 4 (four) hours as needed for wheezing or shortness of breath.   ALPRAZolam 0.25 MG tablet Commonly known as:  XANAX Take 0.25 mg by mouth at bedtime as needed for anxiety.   ARTIFICIAL TEARS 1.4 % ophthalmic solution Generic drug:  polyvinyl alcohol Place 1 drop into both eyes 3 (three) times daily as needed for dry eyes.   aspirin 81 MG tablet Take 81 mg by mouth every morning.   atorvastatin 20 MG tablet Commonly known as:  LIPITOR Take 20 mg by mouth daily at 6 PM.   citalopram 10 MG tablet Commonly  known as:  CELEXA Take 10 mg by mouth every morning.   clopidogrel 75 MG tablet Commonly known as:  PLAVIX Take 75 mg by mouth every morning.   CREON 12000 units Cpep capsule Generic drug:  lipase/protease/amylase Take 36,000 mg by mouth 3 (three) times daily.   diltiazem 120 MG 24 hr capsule Commonly known as:  CARDIZEM CD Take 1 capsule (120 mg total) by mouth every morning. Start taking on:  08/12/2017 What changed:    medication strength  how much to take   ezetimibe 10 MG tablet Commonly known as:  ZETIA Take 1 tablet (10 mg total) by mouth daily. What changed:  when to take this   fluticasone 50 MCG/ACT nasal spray Commonly known as:  FLONASE Place 2 sprays into both nostrils as needed (nasal stuffiness).   gabapentin 300 MG capsule Commonly known as:  NEURONTIN Take 300 mg by mouth at bedtime.   Glycopyrrolate-Formoterol 9-4.8 MCG/ACT Aero Commonly known as:  BEVESPI AEROSPHERE  Inhale 1 puff into the lungs 2 (two) times daily.   guaiFENesin 600 MG 12 hr tablet Commonly known as:  MUCINEX Take 600 mg by mouth 2 (two) times daily as needed for cough.   hydrALAZINE 25 MG tablet Commonly known as:  APRESOLINE Take 1 tablet (25 mg total) by mouth 3 (three) times daily.   hydrochlorothiazide 25 MG tablet Commonly known as:  HYDRODIURIL Take 25 mg by mouth every morning.   hydrochlorothiazide 25 MG tablet Commonly known as:  HYDRODIURIL Take 25 mg by mouth as needed. Take one tablet by mouth every day as needed for Edema not to exceed 50 mg per day   hydrocortisone cream 1 % Apply 1 application topically daily as needed for itching.   levothyroxine 100 MCG tablet Commonly known as:  SYNTHROID, LEVOTHROID Take 100 mcg by mouth every morning.   loratadine 10 MG tablet Commonly known as:  CLARITIN Take 1 tablet (10 mg total) by mouth daily as needed (drainage, drippy nose).   metFORMIN 500 MG tablet Commonly known as:  GLUCOPHAGE Take 500 mg by mouth 2  (two) times daily with a meal.   nitroGLYCERIN 0.6 mg/hr patch Commonly known as:  NITRODUR - Dosed in mg/24 hr Apply patch every morning and remove at bedtime   nitroGLYCERIN 0.4 MG SL tablet Commonly known as:  NITROSTAT Place 0.4 mg under the tongue every 5 (five) minutes as needed for chest pain.   nizatidine 150 MG capsule Commonly known as:  AXID Take 1 capsule by mouth 2 (two) times daily.   omeprazole 20 MG capsule Commonly known as:  PRILOSEC Take 20 mg by mouth daily.   polyethylene glycol powder powder Commonly known as:  GLYCOLAX/MIRALAX Take 17 g by mouth daily.   SF 1.1 % Gel dental gel Generic drug:  sodium fluoride Place 1 drop onto teeth 3 (three) times daily.   simethicone 125 MG chewable tablet Commonly known as:  GAS-X EXTRA STRENGTH Chew 1-2 tablets (125-250 mg total) by mouth 3 (three) times daily with meals. Max 4 tablets daily What changed:    when to take this  reasons to take this  additional instructions   sodium chloride 0.65 % Soln nasal spray Commonly known as:  OCEAN Place 1 spray into both nostrils as needed for congestion.   THEREMS H PO Take 1 tablet by mouth daily.   THEREMS-M Tabs Take 1 tablet by mouth daily.   Vitamin D3 1000 units Caps Take 1,000 Units by mouth every morning.      Allergies  Allergen Reactions  . Reglan [Metoclopramide]     Tremors   . Sulfa Antibiotics Other (See Comments)    shakes      The results of significant diagnostics from this hospitalization (including imaging, microbiology, ancillary and laboratory) are listed below for reference.    Significant Diagnostic Studies: Ct Head Wo Contrast  Result Date: 08/10/2017 CLINICAL DATA:  Altered level of consciousness. Bilateral arm weakness. EXAM: CT HEAD WITHOUT CONTRAST TECHNIQUE: Contiguous axial images were obtained from the base of the skull through the vertex without intravenous contrast. COMPARISON:  02/24/2017 FINDINGS: Brain: There is  no evidence of acute infarct, intracranial hemorrhage, mass, midline shift, or extra-axial fluid collection. Cerebral atrophy is unchanged and not greater than expected for patient's age. Periventricular white matter hypodensities are similar to the prior study and nonspecific but compatible with minimal chronic small vessel ischemic disease, also not greater than expected for age. Vascular: Calcified atherosclerosis at the skull  base. No hyperdense vessel. Skull: No fracture or suspicious osseous lesion. Sinuses/Orbits: Visualized paranasal sinuses and mastoid air cells are clear. Bilateral cataract extraction is noted. Other: None. IMPRESSION: No evidence of acute intracranial abnormality. Electronically Signed   By: Sebastian AcheAllen  Grady M.D.   On: 08/10/2017 19:40   Mr Brain Wo Contrast  Result Date: 08/11/2017 CLINICAL DATA:  Initial evaluation for acute syncope, fainting. EXAM: MRI HEAD WITHOUT CONTRAST TECHNIQUE: Multiplanar, multiecho pulse sequences of the brain and surrounding structures were obtained without intravenous contrast. COMPARISON:  Prior CT from 08/10/2017. FINDINGS: Brain: Diffuse prominence of the CSF containing spaces compatible with generalized cerebral atrophy. Patchy T2/FLAIR hyperintensity within the periventricular white matter, most like related chronic small vessel ischemic disease, mild in nature. Mild small vessel type changes present within the pons as well. No abnormal foci of restricted diffusion to suggest acute or subacute ischemia. Gray-white matter differentiation maintained. No areas of remote cortical infarction. No foci of susceptibility artifact to suggest acute or chronic intracranial hemorrhage. No mass lesion, midline shift or mass effect. Mild ventricular prominence related global parenchymal volume loss of hydrocephalus. No extra-axial fluid collection. Major dural sinuses are grossly patent. Pituitary gland suprasellar region normal. Midline structures intact and normal.  Vascular: Major intracranial vascular flow voids are maintained. Hypoplastic right vertebral artery not well visualized. Skull and upper cervical spine: Degenerative thickening noted at the tectorial membrane. Craniocervical junction otherwise unremarkable. Degenerative spondylolysis at C4-5 with secondary mild spinal stenosis. Bone marrow signal intensity within normal limits. Well-circumscribed T2 hyperintense lesion at the left frontal calvarium noted, indeterminate, but of doubtful significance. Sinuses/Orbits: Globes and orbital soft tissues within normal limits. Patient status post cataract extraction bilaterally. Paranasal sinuses are largely clear. No significant mastoid effusion. Inner ear structures grossly normal. Other: None. IMPRESSION: 1. No acute intracranial abnormality. 2. Age appropriate cerebral atrophy with mild chronic small vessel ischemic changes. 3. Degenerative spondylolysis at C4-5 with resultant mild spinal stenosis. Electronically Signed   By: Rise MuBenjamin  McClintock M.D.   On: 08/11/2017 06:36    Microbiology: Recent Results (from the past 240 hour(s))  MRSA PCR Screening     Status: None   Collection Time: 08/11/17  3:32 AM  Result Value Ref Range Status   MRSA by PCR NEGATIVE NEGATIVE Final    Comment:        The GeneXpert MRSA Assay (FDA approved for NASAL specimens only), is one component of a comprehensive MRSA colonization surveillance program. It is not intended to diagnose MRSA infection nor to guide or monitor treatment for MRSA infections. Performed at [facility], [address] [place], [place] [address]      Labs: Basic Metabolic Panel: Recent Labs  Lab 08/10/17 1822 08/11/17 0920  NA 136 138  K 3.8 3.8  CL 102 105  CO2 24 26  GLUCOSE 155* 153*  BUN 27* 19  CREATININE 1.54* 1.37*  CALCIUM 8.6* 9.0   Liver Function Tests: Recent Labs  Lab 08/10/17 1822  AST 25  ALT 12*  ALKPHOS 64  BILITOT 0.7  PROT 5.8*  ALBUMIN 3.2*   No  results for input(s): LIPASE, AMYLASE in the last 168 hours. No results for input(s): AMMONIA in the last 168 hours. CBC: Recent Labs  Lab 08/10/17 1822 08/11/17 0920  WBC 7.3 6.9  NEUTROABS 4.5  --   HGB 9.9* 10.7*  HCT 29.9* 32.7*  MCV 87.2 87.0  PLT 241 255   Cardiac Enzymes: Recent Labs  Lab 08/10/17 1822  TROPONINI <  0.03   BNP: BNP (last 3 results) Recent Labs    08/11/17 1217  BNP 495.5*    ProBNP (last 3 results) No results for input(s): PROBNP in the last 8760 hours.  CBG: Recent Labs  Lab 08/11/17 0009 08/11/17 0635 08/11/17 1105  GLUCAP 112* 92 110*       Signed:  Hollice Espy, MD [facility] 08/11/2017, 2:51 PM

## 2017-08-11 NOTE — Progress Notes (Signed)
Pt discharge to [facility] and report called off to nurse TurkeyVictoria at the facility. Pt IV and telemetry removed; pt discharge education and instructions completed. Pt transported off unit via wheelchair with belongings and son to the side. Pt's son to transport him off to facility. Dionne BucyP. Amo Mouhamed Glassco RN

## 2017-08-11 NOTE — Clinical Social Work Note (Signed)
Clinical Social Work Assessment  Patient Details  Name: Benjamin PasseyDonald R Wojtas MRN: [id number] Date of Birth: [birth year]  Date of referral:  08/11/17               Reason for consult:  Facility Placement(patient from [facility])                Permission sought to share information with:  Facility Medical sales representativeContact Representative Permission granted to share information::     Name::     Carren RangDon Stayer  Agency::  [facility]  Relationship::  son  Contact Information:  [phone]  Housing/Transportation Living arrangements for the past 2 months:  Assisted Living Facility Source of Information:  Facility, Medical Team, Adult Children Patient Interpreter Needed:  None Criminal Activity/Legal Involvement Pertinent to Current Situation/Hospitalization:  No - Comment as needed Significant Relationships:  Adult Children, Spouse Lives with:  Facility Resident Do you feel safe going back to the place where you live?  Yes Need for family participation in patient care:  Yes (Comment)  Care giving concerns: Patient from [facility]. Patient in observation status and discharging today.   Social Worker assessment / plan: CSW did not receive consult on patient until ready to discharge. Patient not evaluated by PT/OT. CSW did call patient's son, Roe CoombsDon, who indicated he can drive patient back to ALF today. CSW called [facility] and informed ALF that patient will return today. [facility] ready for patient, awaiting discharge summary. CSW to support with discharge.  Employment status:  Retired Health and safety inspectornsurance information:  Medicare PT Recommendations:  Not assessed at this time Information / Referral to community resources:  Other (Comment Required)(back to ALF)  Patient/Family's Response to care: Family appreciative of care.  Patient/Family's Understanding of and Emotional Response to Diagnosis, Current Treatment, and Prognosis: Family with understanding of patient's condition and agreeable for patient to return to  ALF today.  Emotional Assessment Appearance:  Appears stated age Attitude/Demeanor/Rapport:    Affect (typically observed):    Orientation:  Oriented to Self, Oriented to Place, Oriented to  Time, Oriented to Situation Alcohol / Substance use:  Not Applicable Psych involvement (Current and /or in the community):  No (Comment)  Discharge Needs  Concerns to be addressed:  Discharge Planning Concerns, Care Coordination Readmission within the last 30 days:  No Current discharge risk:  None Barriers to Discharge:  No Barriers Identified   Abigail ButtsSusan Tiegan Terpstra, LCSW 08/11/2017, 3:02 PM

## 2017-08-11 NOTE — Progress Notes (Signed)
EEG completed, results pending. 

## 2017-08-11 NOTE — Progress Notes (Addendum)
Patient will discharge back to [facility].  Anticipated discharge date: 08/11/17 Family notified: Carren RangDon Ryer, son Transportation by: Roe Coombson will drive patient back to ALF  Nurse to call report to [phone].  Facility does not require FL2 - patient in observation status and only here since yesterday, also not assessed by PT/OT. CSW did electronically send discharge summary to ALF.   CSW signing off.  Abigail ButtsSusan Jeremaine Maraj, LCSWA  Clinical Social Worker

## 2017-08-12 ENCOUNTER — Telehealth: Payer: Self-pay

## 2017-08-12 NOTE — Telephone Encounter (Signed)
Transition Care Management Follow-up Telephone Call   Date discharged? 08/11/2017  How have you been since you were released from the hospital? Pt is feeling somewhat better.   Do you understand why you were in the hospital? Yes  Do you understand the discharge instructions? Yes  Where were you discharged to? Home  Items Reviewed:  Medications reviewed: Yes  Allergies reviewed: Yes  Dietary changes reviewed: Yes  Referrals reviewed: Yes  Functional Questionnaire:  Activities of Daily Living (ADLs):   States they are independent in the following: All States they require assistance with the following: None   Any transportation issues/concerns?: No  Any patient concerns? Yes  Confirmed importance and date/time of follow-up visits scheduled Yes  Provider Appointment booked with Alphonse GuildAshleigh Shambley, FNP for May 9th, 2019 at 1:00  Confirmed with patient if condition begins to worsen call PCP or go to the ER.  Patient was given the office number and encouraged to call back with question or concerns: Yes

## 2017-08-26 ENCOUNTER — Encounter: Payer: Self-pay | Admitting: Family Medicine

## 2017-08-26 ENCOUNTER — Ambulatory Visit (INDEPENDENT_AMBULATORY_CARE_PROVIDER_SITE_OTHER): Payer: Medicare Other | Admitting: Family Medicine

## 2017-08-26 VITALS — BP 124/62 | HR 57 | Temp 98.4°F | Ht 68.0 in | Wt 161.0 lb

## 2017-08-26 DIAGNOSIS — S60522A Blister (nonthermal) of left hand, initial encounter: Secondary | ICD-10-CM | POA: Insufficient documentation

## 2017-08-26 NOTE — Assessment & Plan Note (Signed)
Developed a blood blister the hypothenar eminence of his left palm. Is left handed and had been using a cane.  - drainage of blister today  - counseled on care  - f/u PRN

## 2017-08-26 NOTE — Progress Notes (Addendum)
ALECXANDER Mccall - 84 y.o. male MRN [id number]  Date of birth: 11/11/1929  SUBJECTIVE:  Including CC & ROS.  Chief Complaint  Patient presents with  . Blister    Benjamin Mccall is a 84 y.o. male that is presenting with a blood blister. Located on his left hand on the hypothenar emminance. It has been present for one week. Denies pain or tenderness. He states his hand rubbed against his cane. He is left handed.     Review of Systems  Constitutional: Negative for fever.  HENT: Negative for congestion.   Respiratory: Negative for cough.   Cardiovascular: Negative for chest pain.  Gastrointestinal: Negative for abdominal pain.  Musculoskeletal: Positive for gait problem.  Skin: Positive for color change.  Neurological: Negative for weakness.  Hematological: Negative for adenopathy.  Psychiatric/Behavioral: Negative for agitation.    HISTORY: Past Medical, Surgical, Social, and Family History Reviewed & Updated per EMR.   Pertinent Historical Findings include:  Past Medical History:  Diagnosis Date  . Asthma   . Atrial fibrillation (HCC)   . Chest pain   . Diabetes mellitus without complication (HCC)   . GERD (gastroesophageal reflux disease)   . Hernia, hiatal   . Hyperlipidemia   . Hypertension   . Hypothyroid     Past Surgical History:  Procedure Laterality Date  . APPENDECTOMY    . CATARACT EXTRACTION    . HIATAL HERNIA REPAIR    . PERCUTANEOUS CORONARY STENT INTERVENTION (PCI-S)      Allergies  Allergen Reactions  . Reglan [Metoclopramide]     Tremors   . Sulfa Antibiotics Other (See Comments)    shakes    Family History  Problem Relation Age of Onset  . Hypertension Mother   . Stroke Mother   . Ovarian cancer Mother   . Stroke Father   . Diabetes Son      Social History   Socioeconomic History  . Marital status: Married    Spouse name: Not on file  . Number of children: 2  . Years of education: 11  . Highest education level: Not on file    Occupational History  . 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 Financial resource strain: Not on file  . Food insecurity:    Worry: Not on file    Inability: Not on file  . Transportation needs:    Medical: Not on file    Non-medical: Not on file  Tobacco Use  . Smoking status: Former Smoker    Packs/day: 0.50    Years: 7.00    Pack years: 3.50    Types: Pipe, Cigars    Last attempt to quit: 04/27/1968    Years since quitting: 49.4  . Smokeless tobacco: Never Used  Substance and Sexual Activity  . Alcohol use: No    Alcohol/week: 0.0 oz  . Drug use: No  . Sexual activity: Not on file  Lifestyle  . Physical activity:    Days per week: Not on file    Minutes per session: Not on file  . Stress: Not on file  Relationships  . Social connections:    Talks on phone: Not on file    Gets together: Not on file    Attends religious service: Not on file    Active member of club or organization: Not on file    Attends meetings of clubs or organizations: Not on file  Relationship status: Not on file  . Intimate partner violence:    Fear of current or ex partner: Not on file    Emotionally abused: Not on file    Physically abused: Not on file    Forced sexual activity: Not on file  Other Topics Concern  . Not on file  Social History Narrative   Born and raised in [place]. Residing in [facility] with his wife. Denies any beliefs effecting healthcare.      PHYSICAL EXAM:  VS: BP 124/62 (BP Location: Left Arm, Patient Position: Sitting, Cuff Size: Normal)   Pulse (!) 57   Temp 98.4 F (36.9 C) (Oral)   Ht  (1.727 m)   Wt 161 lb (73 kg)   SpO2 97%   BMI 24.48 kg/m  Physical Exam Gen: NAD, alert, cooperative with exam, well-appearing ENT: normal lips, normal nasal mucosa,  Eye: normal EOM, normal conjunctiva and lids CV:  no edema, +2 pedal pulses   Resp: no accessory muscle use, non-labored,    Skin: no rashes, no areas of induration  Neuro: normal tone, normal sensation to touch Psych:  normal insight, alert and oriented MSK:  Left Hand:  Circular blood blister that is 1 cm in diameter on hypothenar eminence.  No redness or streaking  No TTP of the blister Neurovascularly intact.    Incision and Drainage Procedure Note:  The affected area was cleaned and draped in a sterile fashion. An 11-blade scalpel was used to incise the blister. A hemostat was used to break any loculations that were present. . A sterile dressing was applied to the area. The patient tolerated the procedure well. No complications were encountered.     ASSESSMENT & PLAN:   Blister of left hand Developed a blood blister the hypothenar eminence of his left palm. Is left handed and had been using a cane.  - drainage of blister today  - counseled on care  - f/u PRN

## 2017-08-26 NOTE — Patient Instructions (Signed)
Nice to meet you  Please keep the area clean Please try to use your right hand instead of your left while this is healing  Please follow up if there is no improvement.

## 2017-09-02 ENCOUNTER — Other Ambulatory Visit (INDEPENDENT_AMBULATORY_CARE_PROVIDER_SITE_OTHER): Payer: Medicare Other

## 2017-09-02 ENCOUNTER — Ambulatory Visit (INDEPENDENT_AMBULATORY_CARE_PROVIDER_SITE_OTHER)
Admission: RE | Admit: 2017-09-02 | Discharge: 2017-09-02 | Disposition: A | Discharge status: Home / Self Care | Payer: Medicare Other | Source: Ambulatory Visit | Attending: Nurse Practitioner | Admitting: Nurse Practitioner

## 2017-09-02 ENCOUNTER — Ambulatory Visit (INDEPENDENT_AMBULATORY_CARE_PROVIDER_SITE_OTHER): Payer: Medicare Other | Admitting: Nurse Practitioner

## 2017-09-02 ENCOUNTER — Encounter: Payer: Self-pay | Admitting: Nurse Practitioner

## 2017-09-02 VITALS — BP 116/58 | HR 56 | Temp 98.3°F | Resp 16 | Ht 68.0 in | Wt 160.0 lb

## 2017-09-02 DIAGNOSIS — I10 Essential (primary) hypertension: Secondary | ICD-10-CM

## 2017-09-02 DIAGNOSIS — M25552 Pain in left hip: Secondary | ICD-10-CM

## 2017-09-02 DIAGNOSIS — R5383 Other fatigue: Secondary | ICD-10-CM

## 2017-09-02 DIAGNOSIS — E119 Type 2 diabetes mellitus without complications: Secondary | ICD-10-CM

## 2017-09-02 DIAGNOSIS — E039 Hypothyroidism, unspecified: Secondary | ICD-10-CM

## 2017-09-02 DIAGNOSIS — R001 Bradycardia, unspecified: Secondary | ICD-10-CM | POA: Diagnosis not present

## 2017-09-02 DIAGNOSIS — R413 Other amnesia: Secondary | ICD-10-CM | POA: Diagnosis not present

## 2017-09-02 DIAGNOSIS — Z79899 Other long term (current) drug therapy: Secondary | ICD-10-CM | POA: Diagnosis not present

## 2017-09-02 LAB — HEMOGLOBIN A1C: Hgb A1c MFr Bld: 6.3 % (ref 4.6–6.5)

## 2017-09-02 LAB — TSH: TSH: 0.25 u[IU]/mL — AB (ref 0.35–4.50)

## 2017-09-02 LAB — URINALYSIS
BILIRUBIN URINE: NEGATIVE
HGB URINE DIPSTICK: NEGATIVE
Ketones, ur: NEGATIVE
LEUKOCYTES UA: NEGATIVE
NITRITE: NEGATIVE
Specific Gravity, Urine: 1.02 (ref 1.000–1.030)
TOTAL PROTEIN, URINE-UPE24: NEGATIVE
UROBILINOGEN UA: 2 — AB (ref 0.0–1.0)
Urine Glucose: NEGATIVE
pH: 6 (ref 5.0–8.0)

## 2017-09-02 MED ORDER — ATORVASTATIN CALCIUM 10 MG PO TABS: 10.0000 mg | ORAL_TABLET | Freq: Every day | ORAL | 0 refills | Status: AC

## 2017-09-02 NOTE — Patient Instructions (Addendum)
Please STOP hydralazine.  Please STOP zetia.  Please REDUCE your lipitor dosage in half to  once daily.  Please head downstairs for lab work/x-rays. If any of your test results are critically abnormal, you will be contacted right away. Your results may be released to your MyChart for viewing before I am able to provide you with my response. I will contact you within a week about your test results and any recommendations for abnormalities.  Please return in about 2 weeks so that I can see how you are doing with the medication changes.

## 2017-09-02 NOTE — Progress Notes (Signed)
Name: Benjamin Mccall   MRN: [id number]    DOB: 01/02/1930   Date:09/02/2017       Progress Note  Subjective  Chief Complaint  Chief Complaint  Patient presents with  . Hospitalization Follow-up    feels like he pulled muscle in groin area, son is concerned that a new med from the hospital cardizem or hydralazine is causing memory issues    HPI Benjamin Mccall is coming in today for a hospital follow up. He is here today for his visit accompanied by his son.  He presented to the ED on 08/10/17 after a syncopal episode of unknown length of time. On arrival to the ED he was asymptomatic and denied any complaints. There was no report of jerking movement or seizure like activity during the syncopal episode. In the ED, he had, negative troponin, negative urinalysis, stable renal function, normal temperature, bradycardia in the 40s, negative CT head and was admitted into telemetry bed for observation.  In the hospital, He had an unremarkable workup including  normal EEG, Echocardiogram showed preserved ejection fraction and diastolic dysfunction. It was suspected that his syncope could have been related to bradycardia caused by cardizem, and his cardizem dosage was decreased to half strength, hydralazine was added for BP control  He was discharged to home on 08/11/17 with the following  Recommendations for Outpatient Follow-up:  1. Medication change: Cardizem decreased from 240 mg p.o. daily to 120 mg p.o. daily for bradycardia 2. New medication: Hydralazine 25 mg p.o. 3 times daily 3. Patient will follow up with his PCP in the next few weeks for blood pressure check and to ensure heart rate stable  Since he has been home, he reports he has made medication changes as instructed. He lives in assisted living and the nursing staff administer his daily medications to him per medication list. He has noticed increased fatigue since home from the hospital. Although he has been eating and sleeping well, he says he  feels more tired than normal. He has also noticed a "popping, pulling" pain in his left groin and upper leg that is new for him. The pain seemed to start after his syncope episode. His son feels like the patients memory has "not been as sharp" since his hospitalization- for example he forgot what he bought at the store which is not like him. He has been taking tylenol which helps his hip and groin pain. He denies fevers, falls, slurred speech, tremor, chest pain, shortness of breath, cough, abdominal pain, nausea, vomiting, urinary changes, diarrhea.   Patient Active Problem List   Diagnosis Date Noted  . Left hand pain 08/26/2017  . PAF (paroxysmal atrial fibrillation) (HCC) 08/11/2017  . Syncope 08/10/2017  . Hypertension   . GERD (gastroesophageal reflux disease)   . Diabetes mellitus without complication (HCC)   . Neck pain 03/10/2017  . Allergic rhinitis 02/18/2017  . Acute right-sided thoracic back pain 08/17/2016  . Medicare annual wellness visit, subsequent 03/24/2016  . Skin lesion of neck 03/24/2016  . Bloating 07/25/2015  . Occasional tremors 07/25/2015  . Abdominal distention 02/18/2015  . Upper airway cough syndrome 12/25/2014  . Acute upper respiratory infection 07/26/2014  . COPD GOLD II  07/01/2014  . Essential hypertension 04/03/2014  . Rash and nonspecific skin eruption 04/03/2014  . Sarcoidosis 03/13/2014  . Hyperlipidemia 03/13/2014  . Controlled type 2 diabetes mellitus (HCC) 03/13/2014  . Coronary artery disease 03/13/2014  . Atrial fibrillation (HCC) 03/13/2014  . Hypothyroidism 03/13/2014  Past Surgical History:  Procedure Laterality Date  . APPENDECTOMY    . CATARACT EXTRACTION    . HIATAL HERNIA REPAIR    . PERCUTANEOUS CORONARY STENT INTERVENTION (PCI-S)      Family History  Problem Relation Age of Onset  . Hypertension Mother   . Stroke Mother   . Ovarian cancer Mother   . Stroke Father   . Diabetes Son     Social History    Socioeconomic History  . Marital status: Married    Spouse name: Not on file  . Number of children: 2  . Years of education: 60  . Highest education level: Not on file  Occupational History  . 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 Financial resource strain: Not on file  . Food insecurity:    Worry: Not on file    Inability: Not on file  . Transportation needs:    Medical: Not on file    Non-medical: Not on file  Tobacco Use  . Smoking status: Former Smoker    Packs/day: 0.50    Years: 7.00    Pack years: 3.50    Types: Pipe, Cigars    Last attempt to quit: 04/27/1968    Years since quitting: 49.3  . Smokeless tobacco: Never Used  Substance and Sexual Activity  . Alcohol use: No    Alcohol/week: 0.0 oz  . Drug use: No  . Sexual activity: Not on file  Lifestyle  . Physical activity:    Days per week: Not on file    Minutes per session: Not on file  . Stress: Not on file  Relationships  . Social connections:    Talks on phone: Not on file    Gets together: Not on file    Attends religious service: Not on file    Active member of club or organization: Not on file    Attends meetings of clubs or organizations: Not on file    Relationship status: Not on file  . Intimate partner violence:    Fear of current or ex partner: Not on file    Emotionally abused: Not on file    Physically abused: Not on file    Forced sexual activity: Not on file  Other Topics Concern  . Not on file  Social History Narrative   Born and raised in [place]. Residing in [facility] with his wife. Denies any beliefs effecting healthcare.      Current Outpatient Medications:  .  acetaminophen (TYLENOL) 325 MG tablet, Take 650 mg by mouth every 6 (six) hours as needed., Disp: , Rfl:  .  albuterol (PROVENTIL HFA;VENTOLIN HFA) 108 (90 BASE) MCG/ACT inhaler, Inhale 2 puffs into the lungs every 4 (four) hours as needed for  wheezing or shortness of breath. , Disp: , Rfl:  .  ALPRAZolam (XANAX) 0.25 MG tablet, Take 0.25 mg by mouth at bedtime as needed for anxiety., Disp: , Rfl:  .  aspirin 81 MG tablet, Take 81 mg by mouth every morning. , Disp: , Rfl:  .  atorvastatin (LIPITOR) 20 MG tablet, Take 20 mg by mouth daily at 6 PM. , Disp: , Rfl:  .  Cholecalciferol (VITAMIN D3) 1000 UNITS CAPS, Take 1,000 Units by mouth every morning. , Disp: , Rfl:  .  citalopram (CELEXA) 10 MG tablet, Take 10 mg by mouth every morning. , Disp: , Rfl:  .  clopidogrel (PLAVIX)  75 MG tablet, Take 75 mg by mouth every morning. , Disp: , Rfl:  .  CREON 12000 units CPEP capsule, Take 36,000 mg by mouth 3 (three) times daily. , Disp: , Rfl:  .  diltiazem (CARDIZEM CD) 120 MG 24 hr capsule, Take 1 capsule (120 mg total) by mouth every morning., Disp: 30 capsule, Rfl: 1 .  ezetimibe (ZETIA) 10 MG tablet, Take 1 tablet (10 mg total) by mouth daily. (Patient taking differently: Take 10 mg by mouth every morning. ), Disp: 30 tablet, Rfl: 0 .  fluticasone (FLONASE) 50 MCG/ACT nasal spray, Place 2 sprays into both nostrils as needed (nasal stuffiness)., Disp: 16 g, Rfl: 3 .  gabapentin (NEURONTIN) 300 MG capsule, Take 300 mg by mouth at bedtime., Disp: , Rfl:  .  Glycopyrrolate-Formoterol (BEVESPI AEROSPHERE) 9-4.8 MCG/ACT AERO, Inhale 1 puff into the lungs 2 (two) times daily., Disp: 1 Inhaler, Rfl: 0 .  guaiFENesin (MUCINEX) 600 MG 12 hr tablet, Take 600 mg by mouth 2 (two) times daily as needed for cough. , Disp: , Rfl:  .  hydrALAZINE (APRESOLINE) 25 MG tablet, Take 1 tablet (25 mg total) by mouth 3 (three) times daily., Disp: 90 tablet, Rfl: 0 .  hydrochlorothiazide (HYDRODIURIL) 25 MG tablet, Take 25 mg by mouth as needed. Take one tablet by mouth every day as needed for Edema not to exceed 50 mg per day, Disp: , Rfl:  .  hydrocortisone cream 1 %, Apply 1 application topically daily as needed for itching., Disp: , Rfl:  .  Iron-Vitamins (THEREMS  H PO), Take 1 tablet by mouth daily., Disp: , Rfl:  .  levothyroxine (SYNTHROID, LEVOTHROID) 100 MCG tablet, Take 100 mcg by mouth every morning. , Disp: , Rfl:  .  loratadine (CLARITIN) 10 MG tablet, Take 1 tablet (10 mg total) by mouth daily as needed (drainage, drippy nose)., Disp: 30 tablet, Rfl: 9 .  metFORMIN (GLUCOPHAGE) 500 MG tablet, Take 500 mg by mouth 2 (two) times daily with a meal. , Disp: , Rfl:  .  Multiple Vitamins-Minerals (THEREMS-M) TABS, Take 1 tablet by mouth daily., Disp: , Rfl:  .  nitroGLYCERIN (NITRODUR - DOSED IN MG/24 HR) 0.6 mg/hr patch, Apply patch every morning and remove at bedtime, Disp: , Rfl:  .  nitroGLYCERIN (NITROSTAT) 0.4 MG SL tablet, Place 0.4 mg under the tongue every 5 (five) minutes as needed for chest pain., Disp: , Rfl:  .  nizatidine (AXID) 150 MG capsule, Take 1 capsule by mouth 2 (two) times daily., Disp: , Rfl:  .  omeprazole (PRILOSEC) 20 MG capsule, Take 20 mg by mouth daily. , Disp: , Rfl:  .  polyethylene glycol powder (GLYCOLAX/MIRALAX) powder, Take 17 g by mouth daily. , Disp: , Rfl:  .  polyvinyl alcohol (ARTIFICIAL TEARS) 1.4 % ophthalmic solution, Place 1 drop into both eyes 3 (three) times daily as needed for dry eyes., Disp: , Rfl:  .  SF 1.1 % GEL dental gel, Place 1 drop onto teeth 3 (three) times daily. , Disp: , Rfl:  .  simethicone (GAS-X EXTRA STRENGTH) 125 MG chewable tablet, Chew 1-2 tablets (125-250 mg total) by mouth 3 (three) times daily with meals. Max 4 tablets daily (Patient taking differently: Chew 125-250 mg by mouth 4 (four) times daily as needed (gas). Max 4 tablets daily), Disp: 30 tablet, Rfl: 0 .  sodium chloride (OCEAN) 0.65 % SOLN nasal spray, Place 1 spray into both nostrils as needed for congestion., Disp: , Rfl:  Allergies  Allergen Reactions  . Reglan [Metoclopramide]     Tremors   . Sulfa Antibiotics Other (See Comments)    shakes     ROS See HPI  Objective  Vitals:   09/02/17 1259  BP: (!)  116/58  Pulse: (!) 56  Resp: 16  Temp: 98.3 F (36.8 C)  TempSrc: Oral  SpO2: 97%  Weight: 160 lb (72.6 kg)  Height:  (1.727 m)  HR stable  Body mass index is 24.33 kg/m.  Physical Exam Vital signs reviewed. Constitutional: Patient appears well-developed and well-nourished. No distress.  HENT: Head: Normocephalic and atraumatic. Nose: Nose normal. Mouth/Throat: Oropharynx is clear and moist. No oropharyngeal exudate.  Eyes: Conjunctivae and EOM are normal. Pupils are equal, round, and reactive to light. No scleral icterus.  Neck: Normal range of motion. Neck supple. Cardiovascular: Normal rate, regular rhythm and normal heart sounds.  No murmur heard. No BLE edema. Distal pulses intact. Pulmonary/Chest: Effort normal and breath sounds normal. No respiratory distress. Musculoskeletal: No gross deformities.Normal range of motion. Left hip without tenderness, deformity. Neurological: he is alert and oriented to person, place, and time. No cranial nerve deficit. Strength symmetrical bilaterally. Ambulatory with walker. Skin: Skin is warm and dry. No rash noted. No erythema.  Psychiatric: Patient has a normal mood and affect. behavior is normal. Judgment and thought content normal.  Assessment & Plan RTC in 2 weeks for F/U: Fatigue, Memory impairment, BP, HR- stopping hydralazine. Also stopping zetia, reducing lipitor today.  Pain of left hip joint Could be a muscle strain, but  fracture or other acute abnormality should be ruled out F/U with further recommendations pending test results - DG HIPS BILAT WITH PELVIS 2V; Future  Memory loss Update TSH, rule out UTI Return precautions discussed during visit today RTC in 2 weeks for F/U - TSH; Future - Urinalysis; Future - CULTURE, URINE COMPREHENSIVE; Future  Other fatigue Could be related to hypotension, bradycardia, deconditioning from hospital stay Labs today Return precautions discussed during visit today RTC in 2 weeks  for F/U - TSH; Future - Hemoglobin A1c; Future - Urinalysis; Future - CULTURE, URINE COMPREHENSIVE; Future  Bradycardia Heart rate at baseline today Stopping hydralazine Continue cardizem at current dosage for now RTC in 2 weeks for F/U

## 2017-09-04 ENCOUNTER — Encounter: Payer: Self-pay | Admitting: Nurse Practitioner

## 2017-09-04 DIAGNOSIS — Z79899 Other long term (current) drug therapy: Secondary | ICD-10-CM | POA: Insufficient documentation

## 2017-09-04 NOTE — Assessment & Plan Note (Signed)
BP is actually low today, which could contribute to his fatigue Will stop hydralazine, continue cardizem Continue to monitor BP at home RTC in 2 weeks for F/U

## 2017-09-04 NOTE — Assessment & Plan Note (Signed)
Continue synthroid Update TSH F/U with further recommendations pending lab results - TSH; Future 

## 2017-09-04 NOTE — Assessment & Plan Note (Signed)
Could be contributing to his complaints today Stop hydralazine, zetia. Reduce lipitor RTC in 2 weeks for follow up We can consider addition medication reductions at next visit if indicated

## 2017-09-04 NOTE — Assessment & Plan Note (Signed)
Currently maintained on metformin Reduced renal function noted on hospital BMET Update A1c and F/U with further recommendations pending lab results - Hemoglobin A1c; Future

## 2017-09-05 LAB — CULTURE, URINE COMPREHENSIVE
MICRO NUMBER: 90567519
RESULT:: NO GROWTH
SPECIMEN QUALITY:: ADEQUATE

## 2017-09-07 ENCOUNTER — Telehealth: Payer: Self-pay | Admitting: Nurse Practitioner

## 2017-09-07 NOTE — Telephone Encounter (Signed)
Paper has been signed and faxed

## 2017-09-07 NOTE — Telephone Encounter (Signed)
Copied from CRM [id number]. Topic: Quick Communication - See Telephone Encounter >> Sep 07, 2017 10:28 AM Terisa Starr wrote: CRM for notification. See Telephone encounter for: 09/07/17.  [facility] called and said they faxed a physician order sheet on Monday, 5/13. She needs Alphonse Guild to sign that and fax it back as soon as possible. Fax number [phone] and phone number is [phone]

## 2017-09-14 ENCOUNTER — Other Ambulatory Visit: Payer: Self-pay | Admitting: Family

## 2017-09-14 MED ORDER — LEVOTHYROXINE SODIUM 88 MCG PO TABS: 88.0000 ug | ORAL_TABLET | ORAL | 0 refills | Status: DC

## 2017-09-17 ENCOUNTER — Ambulatory Visit: Payer: Medicare Other | Admitting: Nurse Practitioner

## 2017-10-25 ENCOUNTER — Ambulatory Visit (INDEPENDENT_AMBULATORY_CARE_PROVIDER_SITE_OTHER): Payer: Medicare Other | Admitting: Nurse Practitioner

## 2017-10-25 ENCOUNTER — Encounter: Payer: Self-pay | Admitting: Nurse Practitioner

## 2017-10-25 ENCOUNTER — Other Ambulatory Visit (INDEPENDENT_AMBULATORY_CARE_PROVIDER_SITE_OTHER): Payer: Medicare Other

## 2017-10-25 VITALS — BP 142/62 | HR 50 | Temp 98.2°F | Resp 16 | Ht 68.0 in | Wt 164.0 lb

## 2017-10-25 DIAGNOSIS — E039 Hypothyroidism, unspecified: Secondary | ICD-10-CM | POA: Diagnosis not present

## 2017-10-25 DIAGNOSIS — I10 Essential (primary) hypertension: Secondary | ICD-10-CM

## 2017-10-25 DIAGNOSIS — R001 Bradycardia, unspecified: Secondary | ICD-10-CM

## 2017-10-25 DIAGNOSIS — E785 Hyperlipidemia, unspecified: Secondary | ICD-10-CM

## 2017-10-25 LAB — LIPID PANEL
CHOL/HDL RATIO: 3
Cholesterol: 169 mg/dL (ref 0–200)
HDL: 57.3 mg/dL (ref >39.00)
LDL Cholesterol: 92 mg/dL (ref 0–99)
NONHDL: 111.41
Triglycerides: 96 mg/dL (ref 0.0–149.0)
VLDL: 19.2 mg/dL (ref 0.0–40.0)

## 2017-10-25 LAB — TSH: TSH: 0.17 u[IU]/mL — ABNORMAL LOW (ref 0.35–4.50)

## 2017-10-25 NOTE — Progress Notes (Signed)
Name: Benjamin Mccall   MRN: [id number]    DOB: [birth year]   Date:10/25/2017       Progress Note  Subjective  Chief Complaint  Chief Complaint  Patient presents with  . Follow-up    thyroid    HPI Benjamin Mccall is here for a follow up of hypertension, HLD, and hypothyroidism today. I last saw him for a hospital follow up on 09/02/17 and he was complaining on fatigue and memory changes. On his 09/02/17 visit, I discontinued his hydralazine and zetia and reduced his Lipitor dosage to 10 daily. His labwork from 5/9 visit showed a low TSH so he was instructed to lower synthroid dosage to daily as well. He returns today for follow up. He tells me that he feels much better than when I last saw him, has not felt as fatigued and has not noticed memory issues as discussed last visit, occasionally feels like he forgets some small details but overall not having trouble with his memory. He tells me that he has made medication adjustments as instructed at last OV, medications are administered to him by staff at assisted living facility. He has continued cardizem 120 daily for HTN, PAF. He reports nursing staff have been checking his BP readings routinely at home, readings have been good in the 120s-130s/60s per his report today. Denies falls, weakness, headaches, confusion, vision changes, chest pain, shortness of breath, edema. He says he has felt worried recently about his wife due to her health problems, but aside from that he overall feels well.  BP Readings from Last 3 Encounters:  10/25/17 (!) 142/62  09/02/17 (!) 116/58  08/26/17 124/62     Patient Active Problem List   Diagnosis Date Noted  . Polypharmacy 09/04/2017  . Blister of left hand 08/26/2017  . PAF (paroxysmal atrial fibrillation) (HCC) 08/11/2017  . Syncope 08/10/2017  . Hypertension   . GERD (gastroesophageal reflux disease)   . Diabetes mellitus without complication (HCC)   . Neck pain 03/10/2017  . Allergic rhinitis  02/18/2017  . Acute right-sided thoracic back pain 08/17/2016  . Medicare annual wellness visit, subsequent 03/24/2016  . Skin lesion of neck 03/24/2016  . Bloating 07/25/2015  . Occasional tremors 07/25/2015  . Abdominal distention 02/18/2015  . Upper airway cough syndrome 12/25/2014  . Acute upper respiratory infection 07/26/2014  . COPD GOLD II  07/01/2014  . Essential hypertension 04/03/2014  . Rash and nonspecific skin eruption 04/03/2014  . Sarcoidosis 03/13/2014  . Hyperlipidemia 03/13/2014  . Controlled type 2 diabetes mellitus (HCC) 03/13/2014  . Coronary artery disease 03/13/2014  . Atrial fibrillation (HCC) 03/13/2014  . Hypothyroidism 03/13/2014    Past Surgical History:  Procedure Laterality Date  . APPENDECTOMY    . CATARACT EXTRACTION    . HIATAL HERNIA REPAIR    . PERCUTANEOUS CORONARY STENT INTERVENTION (PCI-S)      Family History  Problem Relation Age of Onset  . Hypertension Mother   . Stroke Mother   . Ovarian cancer Mother   . Stroke Father   . Diabetes Son     Social History   Socioeconomic History  . Marital status: Married    Spouse name: Not on file  . Number of children: 2  . Years of education: 85  . Highest education level: Not on file  Occupational History  . 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 Financial resource strain: Not on  file  . Food insecurity:    Worry: Not on file    Inability: Not on file  . Transportation needs:    Medical: Not on file    Non-medical: Not on file  Tobacco Use  . Smoking status: Former Smoker    Packs/day: 0.50    Years: 7.00    Pack years: 3.50    Types: Pipe, Cigars    Last attempt to quit: 04/27/1968    Years since quitting: 49.5  . Smokeless tobacco: Never Used  Substance and Sexual Activity  . Alcohol use: No    Alcohol/week: 0.0 oz  . Drug use: No  . Sexual activity: Not on file  Lifestyle  . Physical activity:    Days per week:  Not on file    Minutes per session: Not on file  . Stress: Not on file  Relationships  . Social connections:    Talks on phone: Not on file    Gets together: Not on file    Attends religious service: Not on file    Active member of club or organization: Not on file    Attends meetings of clubs or organizations: Not on file    Relationship status: Not on file  . Intimate partner violence:    Fear of current or ex partner: Not on file    Emotionally abused: Not on file    Physically abused: Not on file    Forced sexual activity: Not on file  Other Topics Concern  . Not on file  Social History Narrative   Born and raised in [place]. Residing in [place] [facility] with his wife. Denies any beliefs effecting healthcare.      Current Outpatient Medications:  .  acetaminophen (TYLENOL) 325 MG tablet, Take 650 mg by mouth every 6 (six) hours as needed., Disp: , Rfl:  .  albuterol (PROVENTIL HFA;VENTOLIN HFA) 108 (90 BASE) MCG/ACT inhaler, Inhale 2 puffs into the lungs every 4 (four) hours as needed for wheezing or shortness of breath. , Disp: , Rfl:  .  aspirin 81 MG tablet, Take 81 mg by mouth every morning. , Disp: , Rfl:  .  atorvastatin (LIPITOR) 10 MG tablet, Take 1 tablet (10 mg total) by mouth daily., Disp: 30 tablet, Rfl: 0 .  Cholecalciferol (VITAMIN D3) 1000 UNITS CAPS, Take 1,000 Units by mouth every morning. , Disp: , Rfl:  .  citalopram (CELEXA) 10 MG tablet, Take 10 mg by mouth every morning. , Disp: , Rfl:  .  clopidogrel (PLAVIX) 75 MG tablet, Take 75 mg by mouth every morning. , Disp: , Rfl:  .  CREON 12000 units CPEP capsule, Take 36,000 mg by mouth 3 (three) times daily. , Disp: , Rfl:  .  diltiazem (CARDIZEM CD) 120 MG 24 hr capsule, Take 1 capsule (120 mg total) by mouth every morning., Disp: 30 capsule, Rfl: 1 .  fluticasone (FLONASE) 50 MCG/ACT nasal spray, Place 2 sprays into both nostrils as needed (nasal stuffiness)., Disp: 16 g, Rfl: 3 .  gabapentin  (NEURONTIN) 300 MG capsule, Take 300 mg by mouth at bedtime., Disp: , Rfl:  .  Glycopyrrolate-Formoterol (BEVESPI AEROSPHERE) 9-4.8 MCG/ACT AERO, Inhale 1 puff into the lungs 2 (two) times daily., Disp: 1 Inhaler, Rfl: 0 .  guaiFENesin (MUCINEX) 600 MG 12 hr tablet, Take 600 mg by mouth 2 (two) times daily as needed for cough. , Disp: , Rfl:  .  hydrochlorothiazide (HYDRODIURIL) 25 MG tablet, Take 25 mg by mouth as  needed. Take one tablet by mouth every day as needed for Edema not to exceed 50 mg per day, Disp: , Rfl:  .  hydrocortisone cream 1 %, Apply 1 application topically daily as needed for itching., Disp: , Rfl:  .  Iron-Vitamins (THEREMS H PO), Take 1 tablet by mouth daily., Disp: , Rfl:  .  levothyroxine (SYNTHROID, LEVOTHROID) 88 MCG tablet, Take 1 tablet (88 mcg total) by mouth every morning., Disp: 90 tablet, Rfl: 0 .  loratadine (CLARITIN) 10 MG tablet, Take 1 tablet (10 mg total) by mouth daily as needed (drainage, drippy nose)., Disp: 30 tablet, Rfl: 9 .  metFORMIN (GLUCOPHAGE) 500 MG tablet, Take 500 mg by mouth 2 (two) times daily with a meal. , Disp: , Rfl:  .  Multiple Vitamins-Minerals (THEREMS-M) TABS, Take 1 tablet by mouth daily., Disp: , Rfl:  .  nitroGLYCERIN (NITRODUR - DOSED IN MG/24 HR) 0.6 mg/hr patch, Apply patch every morning and remove at bedtime, Disp: , Rfl:  .  nitroGLYCERIN (NITROSTAT) 0.4 MG SL tablet, Place 0.4 mg under the tongue every 5 (five) minutes as needed for chest pain., Disp: , Rfl:  .  nizatidine (AXID) 150 MG capsule, Take 1 capsule by mouth 2 (two) times daily., Disp: , Rfl:  .  omeprazole (PRILOSEC) 20 MG capsule, Take 20 mg by mouth daily. , Disp: , Rfl:  .  polyethylene glycol powder (GLYCOLAX/MIRALAX) powder, Take 17 g by mouth daily. , Disp: , Rfl:  .  polyvinyl alcohol (ARTIFICIAL TEARS) 1.4 % ophthalmic solution, Place 1 drop into both eyes 3 (three) times daily as needed for dry eyes., Disp: , Rfl:  .  SF 1.1 % GEL dental gel, Place 1 drop  onto teeth 3 (three) times daily. , Disp: , Rfl:  .  simethicone (GAS-X EXTRA STRENGTH) 125 MG chewable tablet, Chew 1-2 tablets (125-250 mg total) by mouth 3 (three) times daily with meals. Max 4 tablets daily (Patient taking differently: Chew 125-250 mg by mouth 4 (four) times daily as needed (gas). Max 4 tablets daily), Disp: 30 tablet, Rfl: 0 .  sodium chloride (OCEAN) 0.65 % SOLN nasal spray, Place 1 spray into both nostrils as needed for congestion., Disp: , Rfl:   Allergies  Allergen Reactions  . Reglan [Metoclopramide]     Tremors   . Sulfa Antibiotics Other (See Comments)    shakes     ROS See HPI  Objective  Vitals:   10/25/17 1054 10/25/17 1135  BP: (!) 158/74 (!) 142/62  Pulse: (!) 49 (!) 50  Resp: 16   Temp: 98.2 F (36.8 C)   TempSrc: Oral   SpO2: 97%   Weight: 164 lb (74.4 kg)   Height: 5\' 8"  (1.727 m)     Body mass index is 24.94 kg/m.  Physical Exam Vital signs reviewed. Constitutional: Patient appears well-developed and well-nourished. No distress.  HENT: Head: Normocephalic and atraumatic. Nose: Nose normal. Mouth/Throat: Oropharynx is clear and moist. No oropharyngeal exudate.  Eyes: Conjunctivae and EOM are normal. Pupils are equal, round, and reactive to light. No scleral icterus.  Neck: Normal range of motion. Neck supple. Cardiovascular: Bradycardic rate, regular rhythm and normal heart sounds.  No murmur heard. Distal pulses intact. Pulmonary/Chest: Effort normal and breath sounds normal. No respiratory distress. Musculoskeletal: No gross deformities.Normal range of motion.  Neurological: he is alert and oriented to person, place, and time. No cranial nerve deficit. Strength symmetrical bilaterally. Ambulatory with walker. Skin: Skin is warm and dry. No rash  noted. No erythema.  Psychiatric: Patient has a normal mood and affect. behavior is normal. Judgment and thought content normal.   Assessment & Plan RTC in 4 months for F/U: DM- check  A1c

## 2017-10-25 NOTE — Assessment & Plan Note (Signed)
HR appears stable today Continue annual F/U with cardiology  Continue to monitor HR

## 2017-10-25 NOTE — Assessment & Plan Note (Signed)
BP slightly elevated on first reading today, better on second reading with reportedly normal home readings Continue current medications Continue to monitor His assisted living facility will continue to monitor regular BP readings and notify for high or low readings

## 2017-10-25 NOTE — Assessment & Plan Note (Signed)
Continue current medications Update lipid panel today- he is not fasting F/U with further recommendations pending lab results - Lipid panel; Future

## 2017-10-25 NOTE — Patient Instructions (Signed)
Please head downstairs for lab work. If any of your test results are critically abnormal, you will be contacted right away. Otherwise, I will contact you within a week about your test results and any recommendations for abnormalities.  Please return in about 4 months for a diabetes follow up, or sooner if needed

## 2017-10-25 NOTE — Assessment & Plan Note (Signed)
Continue synthroid Update TSH today F/U with further recommendations pending lab results - TSH; Future

## 2017-10-27 ENCOUNTER — Other Ambulatory Visit: Payer: Self-pay | Admitting: Family

## 2017-10-27 MED ORDER — LEVOTHYROXINE SODIUM 75 MCG PO TABS: 75.0000 ug | ORAL_TABLET | ORAL | 0 refills | Status: AC

## 2017-11-01 ENCOUNTER — Other Ambulatory Visit: Payer: Self-pay | Admitting: Nurse Practitioner

## 2017-11-01 DIAGNOSIS — E039 Hypothyroidism, unspecified: Secondary | ICD-10-CM

## 2017-12-01 ENCOUNTER — Telehealth: Payer: Self-pay

## 2017-12-01 NOTE — Telephone Encounter (Signed)
Sent orders to DC alprazolam.

## 2017-12-01 NOTE — Telephone Encounter (Signed)
Copied from CRM [id number]. Topic: General - Other >> Dec 01, 2017 12:14 PM Marylen PontoMcneil, Ja-Kwan wrote: Reason for CRM: [facility] requests discontinue order for the ALPRAZolam (XANAX) 0.25 MG tablet. Cb# [phone]  Fax# [phone]

## 2018-01-06 ENCOUNTER — Other Ambulatory Visit: Payer: Self-pay

## 2018-01-06 ENCOUNTER — Emergency Department (HOSPITAL_COMMUNITY): Payer: Medicare Other

## 2018-01-06 ENCOUNTER — Emergency Department (HOSPITAL_COMMUNITY)
Admission: EM | Admit: 2018-01-06 | Discharge: 2018-01-06 | Disposition: A | Discharge status: Home / Self Care | Payer: Medicare Other | Attending: Emergency Medicine | Admitting: Emergency Medicine

## 2018-01-06 ENCOUNTER — Encounter (HOSPITAL_COMMUNITY): Payer: Self-pay | Admitting: *Deleted

## 2018-01-06 DIAGNOSIS — Z7982 Long term (current) use of aspirin: Secondary | ICD-10-CM | POA: Diagnosis not present

## 2018-01-06 DIAGNOSIS — Y999 Unspecified external cause status: Secondary | ICD-10-CM | POA: Diagnosis not present

## 2018-01-06 DIAGNOSIS — W19XXXA Unspecified fall, initial encounter: Secondary | ICD-10-CM

## 2018-01-06 DIAGNOSIS — S42215A Unspecified nondisplaced fracture of surgical neck of left humerus, initial encounter for closed fracture: Secondary | ICD-10-CM | POA: Diagnosis not present

## 2018-01-06 DIAGNOSIS — E119 Type 2 diabetes mellitus without complications: Secondary | ICD-10-CM | POA: Insufficient documentation

## 2018-01-06 DIAGNOSIS — I10 Essential (primary) hypertension: Secondary | ICD-10-CM | POA: Insufficient documentation

## 2018-01-06 DIAGNOSIS — Y92481 Parking lot as the place of occurrence of the external cause: Secondary | ICD-10-CM | POA: Diagnosis not present

## 2018-01-06 DIAGNOSIS — S42212A Unspecified displaced fracture of surgical neck of left humerus, initial encounter for closed fracture: Secondary | ICD-10-CM

## 2018-01-06 DIAGNOSIS — W010XXA Fall on same level from slipping, tripping and stumbling without subsequent striking against object, initial encounter: Secondary | ICD-10-CM | POA: Insufficient documentation

## 2018-01-06 DIAGNOSIS — Z87891 Personal history of nicotine dependence: Secondary | ICD-10-CM | POA: Insufficient documentation

## 2018-01-06 DIAGNOSIS — S51012A Laceration without foreign body of left elbow, initial encounter: Secondary | ICD-10-CM | POA: Diagnosis present

## 2018-01-06 DIAGNOSIS — Y939 Activity, unspecified: Secondary | ICD-10-CM | POA: Insufficient documentation

## 2018-01-06 DIAGNOSIS — I480 Paroxysmal atrial fibrillation: Secondary | ICD-10-CM | POA: Insufficient documentation

## 2018-01-06 DIAGNOSIS — J449 Chronic obstructive pulmonary disease, unspecified: Secondary | ICD-10-CM | POA: Diagnosis not present

## 2018-01-06 DIAGNOSIS — Z79899 Other long term (current) drug therapy: Secondary | ICD-10-CM | POA: Insufficient documentation

## 2018-01-06 DIAGNOSIS — Z7902 Long term (current) use of antithrombotics/antiplatelets: Secondary | ICD-10-CM | POA: Insufficient documentation

## 2018-01-06 MED ORDER — ACETAMINOPHEN 325 MG PO TABS: 325.0000 mg | ORAL_TABLET | Freq: Four times a day (QID) | ORAL | 0 refills | Status: DC | PRN

## 2018-01-06 MED ORDER — HYDROCODONE-ACETAMINOPHEN 5-325 MG PO TABS: 1.0000 | ORAL_TABLET | Freq: Four times a day (QID) | ORAL | 0 refills | Status: DC | PRN

## 2018-01-06 MED ORDER — HYDROCODONE-ACETAMINOPHEN 5-325 MG PO TABS
1.0000 | ORAL_TABLET | Freq: Once | ORAL | Status: AC
  Administered 2018-01-06: 1 via ORAL
  Filled 2018-01-06: qty 1

## 2018-01-06 NOTE — ED Triage Notes (Signed)
Pt reports that he fell in the parking lot and struck his left elbow (dressing in place) and has pain in his left shoulder from fall also.  No LOC with fall.  Pt states that he felt like "gravity pulled" him.  No CP or sob with this.  Pt pulled back to be with his wife and son in hall bed.

## 2018-01-06 NOTE — Discharge Instructions (Signed)
Take 325-650mg  mg of Tylenol (acetaminophen) every 4-6 hours as needed for pain. You can take hydrocodone-acetaminophen as needed for severe pain but do not drive, drink alcohol, or operate heavy machinery while taking this medication.  Be aware this medicine contains Tylenol as well.  You can also cut these tablets in half.  Do not exceed 4000 mg of Tylenol daily.  Be aware that taking ibuprofen while taking Plavix can increase your risk of bleeding.  Your Steri-Strips will fall off after a few days.  Wear the shoulder sling at all times including when bathing.  You can cover the sling with a bag when bathing to avoid getting it wet.  Follow-up with orthopedics for reevaluation within 1 to 2 weeks.  They will eventually start you on physical therapy.  Follow-up with your primary care physician or your cardiologist for reevaluation of these falls that you have been experiencing recently.  You may need medication adjustments.   Return to the emergency department if any concerning signs or symptoms develop such as fevers, swelling, altered mental status, streaking of the skin or redness of the skin, or additional falls/loss of consciousness.

## 2018-01-06 NOTE — ED Provider Notes (Signed)
[facility] EMERGENCY DEPARTMENT Provider Note   CSN: [id number] Arrival date & time: 01/06/18  1818     History   Chief Complaint Chief Complaint  Patient presents with  . Fall    HPI Benjamin Mccall is a 84 y.o. male with history of asthma, atrial fibrillation, diabetes mellitus, hypertension, hyperlipidemia, hypothyroidism, COPD, CAD presents for evaluation of acute onset, constant left shoulder pain secondary to fall just prior to arrival.  His wife was seen in the ED earlier today and was found to be stable for discharge home.  While they were attempting to get his wife in the car to leave the patient states that he acutely felt "like gravity was pulling me down ".  He states he landed on his left shoulder.  Denies head injury or loss of consciousness.  Denies headache.  He denies prodrome leading up to the fall including lightheadedness, shortness of breath, chest pain, vision changes, tinnitus, or dizziness.  He states that he has had these episodes in the past.  Denies syncope.  Was admitted for syncope in April of this year but symptoms do not sound similar.  Presently endorses constant aching pain to the left shoulder rating down the extremity.  Also notes a skin tear to the left elbow.  He is on baby aspirin but is unsure if he is on any other anticoagulants.  Denies numbness.  No medications prior to arrival.  Tetanus is up-to-date.  The history is provided by the patient.    Past Medical History:  Diagnosis Date  . Asthma   . Atrial fibrillation (HCC)   . Chest pain   . Diabetes mellitus without complication (HCC)   . GERD (gastroesophageal reflux disease)   . Hernia, hiatal   . Hyperlipidemia   . Hypertension   . Hypothyroid     Patient Active Problem List   Diagnosis Date Noted  . Sinus bradycardia 10/25/2017  . Polypharmacy 09/04/2017  . Blister of left hand 08/26/2017  . PAF (paroxysmal atrial fibrillation) (HCC) 08/11/2017  . Syncope  08/10/2017  . GERD (gastroesophageal reflux disease)   . Diabetes mellitus without complication (HCC)   . Neck pain 03/10/2017  . Allergic rhinitis 02/18/2017  . Acute right-sided thoracic back pain 08/17/2016  . Medicare annual wellness visit, subsequent 03/24/2016  . Skin lesion of neck 03/24/2016  . Bloating 07/25/2015  . Occasional tremors 07/25/2015  . Abdominal distention 02/18/2015  . Upper airway cough syndrome 12/25/2014  . Acute upper respiratory infection 07/26/2014  . COPD GOLD II  07/01/2014  . Essential hypertension 04/03/2014  . Rash and nonspecific skin eruption 04/03/2014  . Sarcoidosis 03/13/2014  . Hyperlipidemia 03/13/2014  . Controlled type 2 diabetes mellitus (HCC) 03/13/2014  . Coronary artery disease 03/13/2014  . Atrial fibrillation (HCC) 03/13/2014  . Hypothyroidism 03/13/2014    Past Surgical History:  Procedure Laterality Date  . APPENDECTOMY    . CATARACT EXTRACTION    . HIATAL HERNIA REPAIR    . PERCUTANEOUS CORONARY STENT INTERVENTION (PCI-S)          Home Medications    Prior to Admission medications   Medication Sig Start Date End Date Taking? Authorizing Provider  acetaminophen (TYLENOL) 325 MG tablet Take 1 tablet (325 mg total) by mouth every 6 (six) hours as needed. 01/06/18   Chee Kinslow A, PA-C  albuterol (PROVENTIL HFA;VENTOLIN HFA) 108 (90 BASE) MCG/ACT inhaler Inhale 2 puffs into the lungs every 4 (four) hours as needed for wheezing  or shortness of breath.     [provider]  aspirin 81 MG tablet Take 81 mg by mouth every morning.     [provider]  atorvastatin (LIPITOR) 10 MG tablet Take 1 tablet (10 mg total) by mouth daily. 09/02/17   Evaristo Bury, NP  Cholecalciferol (VITAMIN D3) 1000 UNITS CAPS Take 1,000 Units by mouth every morning.     [provider]  citalopram (CELEXA) 10 MG tablet Take 10 mg by mouth every morning.     [provider]  clopidogrel (PLAVIX) 75 MG tablet Take  75 mg by mouth every morning.     [provider]  CREON 12000 units CPEP capsule Take 36,000 mg by mouth 3 (three) times daily.  11/19/15   [provider]  diltiazem (CARDIZEM CD) 120 MG 24 hr capsule Take 1 capsule (120 mg total) by mouth every morning. 08/12/17   Hollice Espy, MD  fluticasone (FLONASE) 50 MCG/ACT nasal spray Place 2 sprays into both nostrils as needed (nasal stuffiness). 02/18/17   Evaristo Bury, NP  gabapentin (NEURONTIN) 300 MG capsule Take 300 mg by mouth at bedtime.    [provider]  Glycopyrrolate-Formoterol (BEVESPI AEROSPHERE) 9-4.8 MCG/ACT AERO Inhale 1 puff into the lungs 2 (two) times daily. 02/23/17   Nyoka Cowden, MD  guaiFENesin (MUCINEX) 600 MG 12 hr tablet Take 600 mg by mouth 2 (two) times daily as needed for cough.     [provider]  hydrochlorothiazide (HYDRODIURIL) 25 MG tablet Take 25 mg by mouth as needed. Take one tablet by mouth every day as needed for Edema not to exceed 50 mg per day    [provider]  HYDROcodone-acetaminophen (NORCO/VICODIN) 5-325 MG tablet Take 1 tablet by mouth every 6 (six) hours as needed. 01/06/18   Quinnlan Abruzzo A, PA-C  hydrocortisone cream 1 % Apply 1 application topically daily as needed for itching.    [provider]  Iron-Vitamins (THEREMS H PO) Take 1 tablet by mouth daily.    [provider]  levothyroxine (SYNTHROID, LEVOTHROID) 75 MCG tablet Take 1 tablet (75 mcg total) by mouth every morning. 10/27/17   Olive Bass, FNP  loratadine (CLARITIN) 10 MG tablet Take 1 tablet (10 mg total) by mouth daily as needed (drainage, drippy nose). 02/18/17   Evaristo Bury, NP  metFORMIN (GLUCOPHAGE) 500 MG tablet Take 500 mg by mouth 2 (two) times daily with a meal.     [provider]  Multiple Vitamins-Minerals (THEREMS-M) TABS Take 1 tablet by mouth daily.    [provider]  nitroGLYCERIN (NITRODUR - DOSED IN MG/24 HR)  0.6 mg/hr patch Apply patch every morning and remove at bedtime    [provider]  nitroGLYCERIN (NITROSTAT) 0.4 MG SL tablet Place 0.4 mg under the tongue every 5 (five) minutes as needed for chest pain.    [provider]  nizatidine (AXID) 150 MG capsule Take 1 capsule by mouth 2 (two) times daily. 04/07/17   [provider]  omeprazole (PRILOSEC) 20 MG capsule Take 20 mg by mouth daily.  04/07/17   [provider]  polyethylene glycol powder (GLYCOLAX/MIRALAX) powder Take 17 g by mouth daily.     [provider]  polyvinyl alcohol (ARTIFICIAL TEARS) 1.4 % ophthalmic solution Place 1 drop into both eyes 3 (three) times daily as needed for dry eyes.    [provider]  SF 1.1 % GEL dental gel Place 1  drop onto teeth 3 (three) times daily.  12/10/16   [provider]  simethicone (GAS-X EXTRA STRENGTH) 125 MG chewable tablet Chew 1-2 tablets (125-250 mg total) by mouth 3 (three) times daily with meals. Max 4 tablets daily Patient taking differently: Chew 125-250 mg by mouth 4 (four) times daily as needed (gas). Max 4 tablets daily 02/18/15   Veryl Speak, FNP  sodium chloride (OCEAN) 0.65 % SOLN nasal spray Place 1 spray into both nostrils as needed for congestion.    [provider]    Family History Family History  Problem Relation Age of Onset  . Hypertension Mother   . Stroke Mother   . Ovarian cancer Mother   . Stroke Father   . Diabetes Son     Social History Social History   Tobacco Use  . Smoking status: Former Smoker    Packs/day: 0.50    Years: 7.00    Pack years: 3.50    Types: Pipe, Cigars    Last attempt to quit: 04/27/1968    Years since quitting: 49.7  . Smokeless tobacco: Never Used  Substance Use Topics  . Alcohol use: No    Alcohol/week: 0.0 standard drinks  . Drug use: No     Allergies   Reglan [metoclopramide] and Sulfa antibiotics   Review of Systems Review of Systems    Constitutional: Negative for chills and fever.  Respiratory: Negative for shortness of breath.   Cardiovascular: Negative for chest pain.  Musculoskeletal: Positive for arthralgias.  Skin: Positive for wound.  Neurological: Negative for light-headedness and numbness.  All other systems reviewed and are negative.    Physical Exam Updated Vital Signs BP (!) 158/66 (BP Location: Right Arm)   Pulse (!) 48   Temp 97.9 F (36.6 C) (Oral)   Resp 16   Wt 74.4 kg   SpO2 99%   BMI 24.94 kg/m   Physical Exam  Constitutional: He appears well-developed and well-nourished. No distress.  HENT:  Head: Normocephalic and atraumatic.  No Battle's signs, no raccoon's eyes, no rhinorrhea. No tenderness to palpation of the face or skull. No deformity, crepitus, or swelling noted.   Eyes: Pupils are equal, round, and reactive to light. Conjunctivae and EOM are normal. Right eye exhibits no discharge. Left eye exhibits no discharge.  Neck: Normal range of motion. Neck supple. No JVD present. No tracheal deviation present.  No midline spine TTP, no paraspinal muscle tenderness, no deformity, crepitus, or step-off noted   Cardiovascular: Normal rate and intact distal pulses.  Bradycardic, 2+ radial pulses bilaterally  Pulmonary/Chest: Effort normal.  Abdominal: Soft. Bowel sounds are normal. He exhibits no distension. There is no tenderness.  Musculoskeletal: He exhibits tenderness. He exhibits no edema.  Good grip strength bilaterally.  He has a 3 cm V-shaped skin tear noted to the proximal left elbow.  Bleeding controlled.  Tenderness to palpation of the left deltoid region.  No obvious deformity noted.  No tenderness palpation of the clavicle or scapula.  Limited range of motion of the left shoulder and elbow, the patient can pronate and supinate the left elbow without difficulty.  Neurological: He is alert.  Fluent speech, no facial droop, sensation intact to soft touch of bilateral upper and  lower extremities.  Skin: Skin is warm and dry. No erythema.  Psychiatric: He has a normal mood and affect. His behavior is normal.  Nursing note and vitals reviewed.    ED Treatments / Results  Labs (all labs ordered  are listed, but only abnormal results are displayed) Labs Reviewed - No data to display  EKG None  Radiology Dg Elbow Complete Left  Result Date: 01/06/2018 CLINICAL DATA:  Fall EXAM: LEFT ELBOW - COMPLETE 3+ VIEW COMPARISON:  None. FINDINGS: No acute fracture. No dislocation.  Unremarkable soft tissues. IMPRESSION: No acute bony pathology. Electronically Signed   By: Jolaine Click M.D.   On: 01/06/2018 20:08   Dg Shoulder Left  Result Date: 01/06/2018 CLINICAL DATA:  Fall. EXAM: LEFT SHOULDER - 2+ VIEW COMPARISON:  None. FINDINGS: There is an acute comminuted fracture of the humeral neck. Main component of the humeral head remains located relative to the glenoid. No evidence for shoulder separation. No scapular fracture evident. Probable old left fourth rib fracture. IMPRESSION: Comminuted fracture of the humeral neck. Electronically Signed   By: Kennith Center M.D.   On: 01/06/2018 20:08    Procedures .Marland KitchenLaceration Repair Date/Time: 01/06/2018 8:50 PM Performed by: Jeanie Sewer, PA-C Authorized by: Jeanie Sewer, PA-C   Consent:    Consent obtained:  Verbal   Consent given by:  Patient   Risks discussed:  Infection, pain, poor cosmetic result and poor wound healing Anesthesia (see MAR for exact dosages):    Anesthesia method:  None Laceration details:    Location:  Shoulder/arm   Shoulder/arm location:  L upper arm   Length (cm):  3   Depth (mm):  1 Repair type:    Repair type:  Simple Pre-procedure details:    Preparation:  Imaging obtained to evaluate for foreign bodies Exploration:    Hemostasis achieved with:  Direct pressure   Wound exploration: wound explored through full range of motion and entire depth of wound probed and visualized     Wound  extent: underlying fracture   Treatment:    Area cleansed with:  Saline   Amount of cleaning:  Extensive   Irrigation solution:  Sterile saline   Irrigation method:  Pressure wash   Visualized foreign bodies/material removed: no   Skin repair:    Repair method:  Steri-Strips   Number of Steri-Strips:  8 Approximation:    Approximation:  Close Post-procedure details:    Dressing:  Open (no dressing)   Patient tolerance of procedure:  Tolerated well, no immediate complications   (including critical care time)  Medications Ordered in ED Medications  HYDROcodone-acetaminophen (NORCO/VICODIN) 5-325 MG per tablet 1 tablet (1 tablet Oral Given 01/06/18 2047)     Initial Impression / Assessment and Plan / ED Course  I have reviewed the triage vital signs and the nursing notes.  Pertinent labs & imaging results that were available during my care of the patient were reviewed by me and considered in my medical decision making (see chart for details).     Patient presents for evaluation of left elbow pain and skin tear after fall.  He is afebrile, bradycardic but states this is baseline.  Chart review corroborates this.  He denies any prodrome to the fall but notes that he has been having episodes lately in which he feels that "gravity" is pulling him down and he falls.  No focal neurologic deficits on examination.  He is ambulatory without difficulty.  No evidence of head injury, no midline spine tenderness.  Radiographs show comminuted fracture of the left humeral neck, no acute abnormality to the elbow.  He is neurovascularly intact.  Doubt CVA, SAH, ICH.  Tetanus is up-to-date. The skin tear was irrigated and repaired with Steri-Strips.  Does not require antibiotics.  He was placed in a shoulder immobilizer.  Recommend follow-up with orthopedics.  Pain managed in the ED.  He is a resident of an assisted living facility and his medications are closely monitored.  Will discharge him home with a  small amount of hydrocodone for severe breakthrough pain.  Discussed strict ED return precautions.  Patient and patient's family verbalized understanding of and agreement with plan and patient is stable for discharge home at this time. Discussed with Dr. Rubin PayorPickering who agrees with assessment and plan at this time.   Final Clinical Impressions(s) / ED Diagnoses   Final diagnoses:  Closed fracture of neck of left humerus, initial encounter  Fall, initial encounter    ED Discharge Orders         Ordered    acetaminophen (TYLENOL) 325 MG tablet  Every 6 hours PRN     01/06/18 2121    HYDROcodone-acetaminophen (NORCO/VICODIN) 5-325 MG tablet  Every 6 hours PRN     01/06/18 2121           Jeanie SewerFawze, Mikela Senn A, PA-C 01/07/18 0103    Benjiman CorePickering, Nathan, MD 01/07/18 0107

## 2018-01-06 NOTE — ED Notes (Signed)
PT TAKEN HOME BY SON. DC INSTRUCTIONS AND PRESCRIPTIONS REVIEWED WITH FAMILY.

## 2018-01-06 NOTE — ED Notes (Signed)
Patient transported to X-ray 

## 2018-01-06 NOTE — ED Notes (Signed)
ED Provider at bedside. 

## 2018-01-10 ENCOUNTER — Ambulatory Visit (INDEPENDENT_AMBULATORY_CARE_PROVIDER_SITE_OTHER): Payer: Medicare Other | Admitting: Nurse Practitioner

## 2018-01-10 ENCOUNTER — Encounter: Payer: Self-pay | Admitting: Nurse Practitioner

## 2018-01-10 ENCOUNTER — Other Ambulatory Visit (INDEPENDENT_AMBULATORY_CARE_PROVIDER_SITE_OTHER): Payer: Medicare Other

## 2018-01-10 VITALS — BP 116/62 | HR 62 | Ht 68.0 in

## 2018-01-10 DIAGNOSIS — E039 Hypothyroidism, unspecified: Secondary | ICD-10-CM

## 2018-01-10 DIAGNOSIS — R531 Weakness: Secondary | ICD-10-CM

## 2018-01-10 DIAGNOSIS — W19XXXD Unspecified fall, subsequent encounter: Secondary | ICD-10-CM | POA: Diagnosis not present

## 2018-01-10 DIAGNOSIS — S42215D Unspecified nondisplaced fracture of surgical neck of left humerus, subsequent encounter for fracture with routine healing: Secondary | ICD-10-CM | POA: Diagnosis not present

## 2018-01-10 DIAGNOSIS — I10 Essential (primary) hypertension: Secondary | ICD-10-CM

## 2018-01-10 DIAGNOSIS — Z23 Encounter for immunization: Secondary | ICD-10-CM

## 2018-01-10 LAB — CORTISOL: CORTISOL PLASMA: 15.4 ug/dL

## 2018-01-10 LAB — TSH: TSH: 1.27 u[IU]/mL (ref 0.35–4.50)

## 2018-01-10 NOTE — Assessment & Plan Note (Signed)
His BP is on the low end today with c/o weakness and falls, I am going to have him stop his HCTZ to see if his symptoms of weakness improve Continue cardizem  RTC in 1 month for F/U- recheck BP

## 2018-01-10 NOTE — Assessment & Plan Note (Signed)
Synthroid dosage was decreased due to abnormal TSH 10/25/17, he has not followed back up for repeat TSH yet Will collect today - TSH; Future

## 2018-01-10 NOTE — Progress Notes (Addendum)
Name: Benjamin Mccall   MRN: [id number]    DOB: 1930-01-03   Date:01/10/2018       Progress Note  Subjective  Chief Complaint ED follow up  HPI Benjamin Mccall is here today for an ED follow up, accompanied by his wife and daughter in law. He was seen at [facility] on 01/06/18 for a fall while helping his wife into car, injuring his left shoulder and elbow, ED xrays showed comminuted fracture of left humeral neck, normal left elbow, referral to orthopedics was made but he says he has not heard about scheduling an appointment yet. He says his shoulder continues to feel sore and he can not move his left arm without pain, He has been taking hydrocodone prn as prescribed in the ED but it causes him to hallucinate, seeing Spots on the wall after taking hydrocodone. He is wearing left arm sling provided in ED. He does tell me that he's experienced several episodes of feeling like he might fall over past several months, normally can hold something to prevent a fall but on 9/12, while he was trying to help his wife into car, he says he "felt like gravity just pulled him down to the ground." He denies fevers, chills, syncope, confusion, slurred speech, chest pain pain, shortness of breath, dizziness.    Patient Active Problem List   Diagnosis Date Noted  . Sinus bradycardia 10/25/2017  . Polypharmacy 09/04/2017  . Blister of left hand 08/26/2017  . PAF (paroxysmal atrial fibrillation) (HCC) 08/11/2017  . Syncope 08/10/2017  . GERD (gastroesophageal reflux disease)   . Diabetes mellitus without complication (HCC)   . Neck pain 03/10/2017  . Allergic rhinitis 02/18/2017  . Acute right-sided thoracic back pain 08/17/2016  . Medicare annual wellness visit, subsequent 03/24/2016  . Skin lesion of neck 03/24/2016  . Bloating 07/25/2015  . Occasional tremors 07/25/2015  . Abdominal distention 02/18/2015  . Upper airway cough syndrome 12/25/2014  . Acute upper respiratory infection 07/26/2014  . COPD GOLD II   07/01/2014  . Essential hypertension 04/03/2014  . Rash and nonspecific skin eruption 04/03/2014  . Sarcoidosis 03/13/2014  . Hyperlipidemia 03/13/2014  . Controlled type 2 diabetes mellitus (HCC) 03/13/2014  . Coronary artery disease 03/13/2014  . Atrial fibrillation (HCC) 03/13/2014  . Hypothyroidism 03/13/2014    Past Surgical History:  Procedure Laterality Date  . APPENDECTOMY    . CATARACT EXTRACTION    . HIATAL HERNIA REPAIR    . PERCUTANEOUS CORONARY STENT INTERVENTION (PCI-S)      Family History  Problem Relation Age of Onset  . Hypertension Mother   . Stroke Mother   . Ovarian cancer Mother   . Stroke Father   . Diabetes Son     Social History   Socioeconomic History  . Marital status: Married    Spouse name: Not on file  . Number of children: 2  . Years of education: 33  . Highest education level: Not on file  Occupational History  . 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 Financial resource strain: Not on file  . Food insecurity:    Worry: Not on file    Inability: Not on file  . Transportation needs:    Medical: Not on file    Non-medical: Not on file  Tobacco Use  . Smoking status: Former Smoker    Packs/day: 0.50    Years: 7.00    Pack years:  3.50    Types: Pipe, Cigars    Last attempt to quit: 04/27/1968    Years since quitting: 49.7  . Smokeless tobacco: Never Used  Substance and Sexual Activity  . Alcohol use: No    Alcohol/week: 0.0 standard drinks  . Drug use: No  . Sexual activity: Not on file  Lifestyle  . Physical activity:    Days per week: Not on file    Minutes per session: Not on file  . Stress: Not on file  Relationships  . Social connections:    Talks on phone: Not on file    Gets together: Not on file    Attends religious service: Not on file    Active member of club or organization: Not on file    Attends meetings of clubs or organizations: Not on file     Relationship status: Not on file  . Intimate partner violence:    Fear of current or ex partner: Not on file    Emotionally abused: Not on file    Physically abused: Not on file    Forced sexual activity: Not on file  Other Topics Concern  . Not on file  Social History Narrative   Born and raised in [place]. Residing in [facility] with his wife. Denies any beliefs effecting healthcare.      Current Outpatient Medications:  .  acetaminophen (TYLENOL) 325 MG tablet, Take 1 tablet (325 mg total) by mouth every 6 (six) hours as needed., Disp: 30 tablet, Rfl: 0 .  albuterol (PROVENTIL HFA;VENTOLIN HFA) 108 (90 BASE) MCG/ACT inhaler, Inhale 2 puffs into the lungs every 4 (four) hours as needed for wheezing or shortness of breath. , Disp: , Rfl:  .  aspirin 81 MG tablet, Take 81 mg by mouth every morning. , Disp: , Rfl:  .  atorvastatin (LIPITOR) 10 MG tablet, Take 1 tablet (10 mg total) by mouth daily., Disp: 30 tablet, Rfl: 0 .  Cholecalciferol (VITAMIN D3) 1000 UNITS CAPS, Take 1,000 Units by mouth every morning. , Disp: , Rfl:  .  citalopram (CELEXA) 10 MG tablet, Take 10 mg by mouth every morning. , Disp: , Rfl:  .  clopidogrel (PLAVIX) 75 MG tablet, Take 75 mg by mouth every morning. , Disp: , Rfl:  .  CREON 12000 units CPEP capsule, Take 36,000 mg by mouth 3 (three) times daily. , Disp: , Rfl:  .  diltiazem (CARDIZEM CD) 120 MG 24 hr capsule, Take 1 capsule (120 mg total) by mouth every morning., Disp: 30 capsule, Rfl: 1 .  fluticasone (FLONASE) 50 MCG/ACT nasal spray, Place 2 sprays into both nostrils as needed (nasal stuffiness)., Disp: 16 g, Rfl: 3 .  gabapentin (NEURONTIN) 300 MG capsule, Take 300 mg by mouth at bedtime., Disp: , Rfl:  .  Glycopyrrolate-Formoterol (BEVESPI AEROSPHERE) 9-4.8 MCG/ACT AERO, Inhale 1 puff into the lungs 2 (two) times daily., Disp: 1 Inhaler, Rfl: 0 .  guaiFENesin (MUCINEX) 600 MG 12 hr tablet, Take 600 mg by mouth 2 (two) times daily as  needed for cough. , Disp: , Rfl:  .  hydrochlorothiazide (HYDRODIURIL) 25 MG tablet, Take 25 mg by mouth as needed. Take one tablet by mouth every day as needed for Edema not to exceed 50 mg per day, Disp: , Rfl:  .  HYDROcodone-acetaminophen (NORCO/VICODIN) 5-325 MG tablet, Take 1 tablet by mouth every 6 (six) hours as needed., Disp: 10 tablet, Rfl: 0 .  hydrocortisone cream 1 %, Apply 1 application topically  daily as needed for itching., Disp: , Rfl:  .  Iron-Vitamins (THEREMS H PO), Take 1 tablet by mouth daily., Disp: , Rfl:  .  levothyroxine (SYNTHROID, LEVOTHROID) 75 MCG tablet, Take 1 tablet (75 mcg total) by mouth every morning., Disp: 90 tablet, Rfl: 0 .  loratadine (CLARITIN) 10 MG tablet, Take 1 tablet (10 mg total) by mouth daily as needed (drainage, drippy nose)., Disp: 30 tablet, Rfl: 9 .  metFORMIN (GLUCOPHAGE) 500 MG tablet, Take 500 mg by mouth 2 (two) times daily with a meal. , Disp: , Rfl:  .  Multiple Vitamins-Minerals (THEREMS-M) TABS, Take 1 tablet by mouth daily., Disp: , Rfl:  .  nitroGLYCERIN (NITRODUR - DOSED IN MG/24 HR) 0.6 mg/hr patch, Apply patch every morning and remove at bedtime, Disp: , Rfl:  .  nitroGLYCERIN (NITROSTAT) 0.4 MG SL tablet, Place 0.4 mg under the tongue every 5 (five) minutes as needed for chest pain., Disp: , Rfl:  .  nizatidine (AXID) 150 MG capsule, Take 1 capsule by mouth 2 (two) times daily., Disp: , Rfl:  .  omeprazole (PRILOSEC) 20 MG capsule, Take 20 mg by mouth daily. , Disp: , Rfl:  .  polyethylene glycol powder (GLYCOLAX/MIRALAX) powder, Take 17 g by mouth daily. , Disp: , Rfl:  .  polyvinyl alcohol (ARTIFICIAL TEARS) 1.4 % ophthalmic solution, Place 1 drop into both eyes 3 (three) times daily as needed for dry eyes., Disp: , Rfl:  .  SF 1.1 % GEL dental gel, Place 1 drop onto teeth 3 (three) times daily. , Disp: , Rfl:  .  simethicone (GAS-X EXTRA STRENGTH) 125 MG chewable tablet, Chew 1-2 tablets (125-250 mg total) by mouth 3 (three) times  daily with meals. Max 4 tablets daily (Patient taking differently: Chew 125-250 mg by mouth 4 (four) times daily as needed (gas). Max 4 tablets daily), Disp: 30 tablet, Rfl: 0 .  sodium chloride (OCEAN) 0.65 % SOLN nasal spray, Place 1 spray into both nostrils as needed for congestion., Disp: , Rfl:   Allergies  Allergen Reactions  . Reglan [Metoclopramide]     Tremors   . Sulfa Antibiotics Other (See Comments)    shakes     ROS See HPI  Objective  Vitals:   01/10/18 1124  BP: 116/62  Pulse: 62  SpO2: 96%  Height: 5\' 8"  (1.727 m)    Body mass index is 24.94 kg/m.  Physical Exam Vital signs reviewed. Constitutional: Patient appears well-developed and well-nourished. No distress.  HENT: Head: Normocephalic and atraumatic.  Nose: Nose normal. Mouth/Throat: Oropharynx is clear and moist. No oropharyngeal exudate.  Eyes: Conjunctivae and EOM are normal. Pupils are equal, round, and reactive to light. No scleral icterus.  Neck: Normal range of motion. Neck supple.  Cardiovascular: Normal rate, regular rhythm and Distal pulses intact. Pulmonary/Chest: Effort normal. No respiratory distress. Neurological: He is alert and oriented to person, place, and time. No cranial nerve deficit. Coordination, balance, strength, speech are normal. Ambulatory with walker. Skin: Skin is warm and dry. No rash noted. No erythema.  Psychiatric: Patient has a normal mood and affect. behavior is normal. Judgment and thought content normal.   Assessment & Plan RTC in 1 month for F/U: Falls, weakness- stopping HCTZ  -Reviewed Health Maintenance:  Need for influenza vaccination- Flu vaccine HIGH DOSE PF  Weakness Orthostatics + today, although he denies any dizziness even during orthostatic testing Labs today His BP is on the low end today, I am going to have him  stop his HCTZ to see if his symptoms of weakness improve Home management and return precautions discussed and additional information  printed in AVS RTC in 1 month for F/U - TSH; Future - Cortisol; Future  Fall, subsequent encounter Orthostatics + today, although he denies any dizziness even during orthostatic testing His BP is on the low end today, I am going to have him stop his HCTZ to see if his symptoms of weakness improve Home management and return precautions discussed and additional information printed in AVS RTC in 1 month for F/U  Closed nondisplaced fracture of surgical neck of left humerus with routine healing, unspecified fracture morphology, subsequent encounter We discussed decreasing dosage of hydrocodone or trying OTC acetaminophen for his shoulder pain due to hallucinations Follow up with orthopedics as instructed, ortho office number provided to pt on AVS

## 2018-01-10 NOTE — Patient Instructions (Addendum)
Please head downstairs for lab work. If any of your test results are critically abnormal, you will be contacted right away. Otherwise, I will contact you within a week about your test results and follow up recommendations  Please Call [facility] to schedule a follow up appointment for your shoulder [phone] You can try to take 1/2 of your vicodin tablet or try tylenol 1000 mg every 8-12 hours for pain instead.  Please stop your hydrochlorothiazide. Please return in about 1 month for a follow up visit, so I can recheck your blood pressure and see how you are doing.   Fall Prevention in the Home Falls can cause injuries. They can happen to people of all ages. There are many things you can do to make your home safe and to help prevent falls. What can I do on the outside of my home?  Regularly fix the edges of walkways and driveways and fix any cracks.  Remove anything that might make you trip as you walk through a door, such as a raised step or threshold.  Trim any bushes or trees on the path to your home.  Use bright outdoor lighting.  Clear any walking paths of anything that might make someone trip, such as rocks or tools.  Regularly check to see if handrails are loose or broken. Make sure that both sides of any steps have handrails.  Any raised decks and porches should have guardrails on the edges.  Have any leaves, snow, or ice cleared regularly.  Use sand or salt on walking paths during winter.  Clean up any spills in your garage right away. This includes oil or grease spills. What can I do in the bathroom?  Use night lights.  Install grab bars by the toilet and in the tub and shower. Do not use towel bars as grab bars.  Use non-skid mats or decals in the tub or shower.  If you need to sit down in the shower, use a plastic, non-slip stool.  Keep the floor dry. Clean up any water that spills on the floor as soon as it happens.  Remove soap buildup  in the tub or shower regularly.  Attach bath mats securely with double-sided non-slip rug tape.  Do not have throw rugs and other things on the floor that can make you trip. What can I do in the bedroom?  Use night lights.  Make sure that you have a light by your bed that is easy to reach.  Do not use any sheets or blankets that are too big for your bed. They should not hang down onto the floor.  Have a firm chair that has side arms. You can use this for support while you get dressed.  Do not have throw rugs and other things on the floor that can make you trip. What can I do in the kitchen?  Clean up any spills right away.  Avoid walking on wet floors.  Keep items that you use a lot in easy-to-reach places.  If you need to reach something above you, use a strong step stool that has a grab bar.  Keep electrical cords out of the way.  Do not use floor polish or wax that makes floors slippery. If you must use wax, use non-skid floor wax.  Do not have throw rugs and other things on the floor that can make you trip. What can I do with my stairs?  Do not leave any items on the stairs.  Make  sure that there are handrails on both sides of the stairs and use them. Fix handrails that are broken or loose. Make sure that handrails are as long as the stairways.  Check any carpeting to make sure that it is firmly attached to the stairs. Fix any carpet that is loose or worn.  Avoid having throw rugs at the top or bottom of the stairs. If you do have throw rugs, attach them to the floor with carpet tape.  Make sure that you have a light switch at the top of the stairs and the bottom of the stairs. If you do not have them, ask someone to add them for you. What else can I do to help prevent falls?  Wear shoes that: ? Do not have high heels. ? Have rubber bottoms. ? Are comfortable and fit you well. ? Are closed at the toe. Do not wear sandals.  If you use a stepladder: ? Make sure  that it is fully opened. Do not climb a closed stepladder. ? Make sure that both sides of the stepladder are locked into place. ? Ask someone to hold it for you, if possible.  Clearly mark and make sure that you can see: ? Any grab bars or handrails. ? First and last steps. ? Where the edge of each step is.  Use tools that help you move around (mobility aids) if they are needed. These include: ? Canes. ? Walkers. ? Scooters. ? Crutches.  Turn on the lights when you go into a dark area. Replace any light bulbs as soon as they burn out.  Set up your furniture so you have a clear path. Avoid moving your furniture around.  If any of your floors are uneven, fix them.  If there are any pets around you, be aware of where they are.  Review your medicines with your doctor. Some medicines can make you feel dizzy. This can increase your chance of falling. Ask your doctor what other things that you can do to help prevent falls. This information is not intended to replace advice given to you by your health care provider. Make sure you discuss any questions you have with your health care provider. Document Released: 02/07/2009 Document Revised: 09/19/2015 Document Reviewed: 05/18/2014 Elsevier Interactive Patient Education  [employer].

## 2018-01-11 ENCOUNTER — Other Ambulatory Visit: Payer: Self-pay | Admitting: *Deleted

## 2018-01-11 MED ORDER — HYDROCODONE-ACETAMINOPHEN 5-325 MG PO TABS: 1.0000 | ORAL_TABLET | Freq: Four times a day (QID) | ORAL | 0 refills | Status: DC | PRN

## 2018-01-11 NOTE — Telephone Encounter (Signed)
Refill request for Hydroco/APAP 5-325 mg 1/2 tab po q 6 hrs prn pain. Ok to refill in PCP's absence? Thanks!

## 2018-01-12 ENCOUNTER — Inpatient Hospital Stay: Payer: Medicare Other | Admitting: Nurse Practitioner

## 2018-01-12 ENCOUNTER — Other Ambulatory Visit: Payer: Self-pay | Admitting: Family

## 2018-01-12 MED ORDER — HYDROCODONE-ACETAMINOPHEN 5-325 MG PO TABS: 1.0000 | ORAL_TABLET | Freq: Four times a day (QID) | ORAL | 0 refills | Status: DC | PRN

## 2018-01-12 NOTE — Progress Notes (Signed)
Patient's assisted living facility called requesting short-term refill on the Norco until he can be seen by orthopedist due to recent fracture of humerus; agree this appropriate; refill sent as requested.

## 2018-01-28 ENCOUNTER — Telehealth: Payer: Self-pay | Admitting: Family Medicine

## 2018-01-28 MED ORDER — HYDROCODONE-ACETAMINOPHEN 5-325 MG PO TABS: 1.0000 | ORAL_TABLET | Freq: Every evening | ORAL | 0 refills | Status: DC | PRN

## 2018-01-28 NOTE — Telephone Encounter (Signed)
Received call from call center that pharmacy had called about refilling hydrocodone.  Reviewed notes and pt has an elbow fracture.  However notes also say that hydrocodone was causing him to hallucinate.  Called pt back- he states that he is now taking hydrocodone at night only to control pain so that he can sleep- otherwise using tylenol. He is not having any hallucinations with using hydrocodone in this way As he has a fracture I will give him 5 hydrocodone pills.  Explained to pt that this is a one time special exception due to his broken arm.  He is advised that in the future all refills, especially controlled substances, must be requested during the week He states understanding I have reviewed N[facility]SR   Meds ordered this encounter  Medications  . HYDROcodone-acetaminophen (NORCO/VICODIN) 5-325 MG tablet    Sig: Take 1 tablet by mouth at bedtime as needed.    Dispense:  5 tablet    Refill:  0

## 2018-01-31 ENCOUNTER — Other Ambulatory Visit: Payer: Self-pay | Admitting: Nurse Practitioner

## 2018-01-31 MED ORDER — HYDROCODONE-ACETAMINOPHEN 5-325 MG PO TABS: 1.0000 | ORAL_TABLET | Freq: Every evening | ORAL | 0 refills | Status: DC | PRN

## 2018-02-08 ENCOUNTER — Telehealth: Payer: Self-pay | Admitting: *Deleted

## 2018-02-08 NOTE — Telephone Encounter (Signed)
Received a Telephone advice record from [facility] call center re: patient's elevated BP readings of 180/85, 185/84 and 189/93 with complaint of feeling cold on 02/07/18. Patient lives at assisted living.   I called Marcelino Duster to check on patient. She states she spoke to him this morning and he was feeling fine, but had not had his BP checked today. I advised her to have him keep ROV with PCP on 02/09/18 as scheduled or get pt to ER or call 911 if sxs change or worsen before then.

## 2018-02-09 ENCOUNTER — Encounter: Payer: Self-pay | Admitting: Nurse Practitioner

## 2018-02-09 ENCOUNTER — Ambulatory Visit (INDEPENDENT_AMBULATORY_CARE_PROVIDER_SITE_OTHER): Payer: Medicare Other | Admitting: Nurse Practitioner

## 2018-02-09 ENCOUNTER — Other Ambulatory Visit (INDEPENDENT_AMBULATORY_CARE_PROVIDER_SITE_OTHER): Payer: Medicare Other

## 2018-02-09 VITALS — BP 160/70 | HR 58 | Ht 68.0 in | Wt 158.0 lb

## 2018-02-09 DIAGNOSIS — Z8781 Personal history of (healed) traumatic fracture: Secondary | ICD-10-CM | POA: Diagnosis not present

## 2018-02-09 DIAGNOSIS — R531 Weakness: Secondary | ICD-10-CM

## 2018-02-09 DIAGNOSIS — M810 Age-related osteoporosis without current pathological fracture: Secondary | ICD-10-CM | POA: Diagnosis not present

## 2018-02-09 DIAGNOSIS — S42215D Unspecified nondisplaced fracture of surgical neck of left humerus, subsequent encounter for fracture with routine healing: Secondary | ICD-10-CM | POA: Diagnosis not present

## 2018-02-09 DIAGNOSIS — I10 Essential (primary) hypertension: Secondary | ICD-10-CM

## 2018-02-09 LAB — CBC
HEMATOCRIT: 32 % — AB (ref 39.0–52.0)
Hemoglobin: 10.9 g/dL — ABNORMAL LOW (ref 13.0–17.0)
MCHC: 34 g/dL (ref 30.0–36.0)
MCV: 86.2 fl (ref 78.0–100.0)
PLATELETS: 324 10*3/uL (ref 150.0–400.0)
RBC: 3.71 Mil/uL — ABNORMAL LOW (ref 4.22–5.81)
RDW: 16.3 % — AB (ref 11.5–15.5)
WBC: 7.7 10*3/uL (ref 4.0–10.5)

## 2018-02-09 LAB — COMPREHENSIVE METABOLIC PANEL
ALT: 11 U/L (ref 0–53)
AST: 16 U/L (ref 0–37)
Albumin: 3.8 g/dL (ref 3.5–5.2)
Alkaline Phosphatase: 72 U/L (ref 39–117)
BILIRUBIN TOTAL: 0.5 mg/dL (ref 0.2–1.2)
BUN: 23 mg/dL (ref 6–23)
CO2: 28 meq/L (ref 19–32)
Calcium: 9.2 mg/dL (ref 8.4–10.5)
Chloride: 101 mEq/L (ref 96–112)
Creatinine, Ser: 1.4 mg/dL (ref 0.40–1.50)
GFR: 51.1 mL/min — AB (ref >60.00)
GLUCOSE: 100 mg/dL — AB (ref 70–99)
POTASSIUM: 4.1 meq/L (ref 3.5–5.1)
Sodium: 138 mEq/L (ref 135–145)
Total Protein: 7.1 g/dL (ref 6.0–8.3)

## 2018-02-09 NOTE — Assessment & Plan Note (Signed)
Update DEXA to determine if he needs treatment for osteoporosis - DG Bone Density; Future 

## 2018-02-09 NOTE — Assessment & Plan Note (Signed)
Update DEXA to determine if he needs treatment for osteoporosis - DG Bone Density; Future

## 2018-02-09 NOTE — Assessment & Plan Note (Signed)
Update DEXA to determine if he needs treatment for osteoporosis Continue F/U with PT, Ortho as planned - DG Bone Density; Future

## 2018-02-09 NOTE — Patient Instructions (Addendum)
Lab work downstairs today  Please schedule Bone density scan  I will see you in about 1 month   Fall Prevention in the Home Falls can cause injuries. They can happen to people of all ages. There are many things you can do to make your home safe and to help prevent falls. What can I do on the outside of my home?  Regularly fix the edges of walkways and driveways and fix any cracks.  Remove anything that might make you trip as you walk through a door, such as a raised step or threshold.  Trim any bushes or trees on the path to your home.  Use bright outdoor lighting.  Clear any walking paths of anything that might make someone trip, such as rocks or tools.  Regularly check to see if handrails are loose or broken. Make sure that both sides of any steps have handrails.  Any raised decks and porches should have guardrails on the edges.  Have any leaves, snow, or ice cleared regularly.  Use sand or salt on walking paths during winter.  Clean up any spills in your garage right away. This includes oil or grease spills. What can I do in the bathroom?  Use night lights.  Install grab bars by the toilet and in the tub and shower. Do not use towel bars as grab bars.  Use non-skid mats or decals in the tub or shower.  If you need to sit down in the shower, use a plastic, non-slip stool.  Keep the floor dry. Clean up any water that spills on the floor as soon as it happens.  Remove soap buildup in the tub or shower regularly.  Attach bath mats securely with double-sided non-slip rug tape.  Do not have throw rugs and other things on the floor that can make you trip. What can I do in the bedroom?  Use night lights.  Make sure that you have a light by your bed that is easy to reach.  Do not use any sheets or blankets that are too big for your bed. They should not hang down onto the floor.  Have a firm chair that has side arms. You can use this for support while you get  dressed.  Do not have throw rugs and other things on the floor that can make you trip. What can I do in the kitchen?  Clean up any spills right away.  Avoid walking on wet floors.  Keep items that you use a lot in easy-to-reach places.  If you need to reach something above you, use a strong step stool that has a grab bar.  Keep electrical cords out of the way.  Do not use floor polish or wax that makes floors slippery. If you must use wax, use non-skid floor wax.  Do not have throw rugs and other things on the floor that can make you trip. What can I do with my stairs?  Do not leave any items on the stairs.  Make sure that there are handrails on both sides of the stairs and use them. Fix handrails that are broken or loose. Make sure that handrails are as long as the stairways.  Check any carpeting to make sure that it is firmly attached to the stairs. Fix any carpet that is loose or worn.  Avoid having throw rugs at the top or bottom of the stairs. If you do have throw rugs, attach them to the floor with carpet tape.  Make sure  that you have a light switch at the top of the stairs and the bottom of the stairs. If you do not have them, ask someone to add them for you. What else can I do to help prevent falls?  Wear shoes that: ? Do not have high heels. ? Have rubber bottoms. ? Are comfortable and fit you well. ? Are closed at the toe. Do not wear sandals.  If you use a stepladder: ? Make sure that it is fully opened. Do not climb a closed stepladder. ? Make sure that both sides of the stepladder are locked into place. ? Ask someone to hold it for you, if possible.  Clearly mark and make sure that you can see: ? Any grab bars or handrails. ? First and last steps. ? Where the edge of each step is.  Use tools that help you move around (mobility aids) if they are needed. These include: ? Canes. ? Walkers. ? Scooters. ? Crutches.  Turn on the lights when you go into a  dark area. Replace any light bulbs as soon as they burn out.  Set up your furniture so you have a clear path. Avoid moving your furniture around.  If any of your floors are uneven, fix them.  If there are any pets around you, be aware of where they are.  Review your medicines with your doctor. Some medicines can make you feel dizzy. This can increase your chance of falling. Ask your doctor what other things that you can do to help prevent falls. This information is not intended to replace advice given to you by your health care provider. Make sure you discuss any questions you have with your health care provider. Document Released: 02/07/2009 Document Revised: 09/19/2015 Document Reviewed: 05/18/2014 Elsevier Interactive Patient Education  [employer].

## 2018-02-09 NOTE — Assessment & Plan Note (Signed)
BP is slightly elevated but has not had any more falls, due to age and hx falls, we will hold on additional BP medications at this time and continue to monitor Continue cardizem at current dosage RTC in 1 month for F/u

## 2018-02-09 NOTE — Progress Notes (Signed)
Benjamin Mccall is a 84 y.o. male with the following history as recorded in Epic[employer]:  Patient Active Problem List   Diagnosis Date Noted  . Sinus bradycardia 10/25/2017  . Polypharmacy 09/04/2017  . Blister of left hand 08/26/2017  . PAF (paroxysmal atrial fibrillation) (HCC) 08/11/2017  . Syncope 08/10/2017  . GERD (gastroesophageal reflux disease)   . Diabetes mellitus without complication (HCC)   . Neck pain 03/10/2017  . Allergic rhinitis 02/18/2017  . Acute right-sided thoracic back pain 08/17/2016  . Medicare annual wellness visit, subsequent 03/24/2016  . Skin lesion of neck 03/24/2016  . Bloating 07/25/2015  . Occasional tremors 07/25/2015  . Abdominal distention 02/18/2015  . Upper airway cough syndrome 12/25/2014  . Acute upper respiratory infection 07/26/2014  . COPD GOLD II  07/01/2014  . Essential hypertension 04/03/2014  . Rash and nonspecific skin eruption 04/03/2014  . Sarcoidosis 03/13/2014  . Hyperlipidemia 03/13/2014  . Controlled type 2 diabetes mellitus (HCC) 03/13/2014  . Coronary artery disease 03/13/2014  . Atrial fibrillation (HCC) 03/13/2014  . Hypothyroidism 03/13/2014    Current Outpatient Medications  Medication Sig Dispense Refill  . acetaminophen (TYLENOL) 325 MG tablet Take 1 tablet (325 mg total) by mouth every 6 (six) hours as needed. 30 tablet 0  . albuterol (PROVENTIL HFA;VENTOLIN HFA) 108 (90 BASE) MCG/ACT inhaler Inhale 2 puffs into the lungs every 4 (four) hours as needed for wheezing or shortness of breath.     [name] aspirin 81 MG tablet Take 81 mg by mouth every morning.     [name] atorvastatin (LIPITOR) 10 MG tablet Take 1 tablet (10 mg total) by mouth daily. 30 tablet 0  . Cholecalciferol (VITAMIN D3) 1000 UNITS CAPS Take 1,000 Units by mouth every morning.     . citalopram (CELEXA) 10 MG tablet Take 10 mg by mouth every morning.     . clopidogrel (PLAVIX) 75 MG tablet Take 75 mg by mouth every morning.     [name] CREON 12000 units CPEP capsule  Take 36,000 mg by mouth 3 (three) times daily.     [name] diltiazem (CARDIZEM CD) 120 MG 24 hr capsule Take 1 capsule (120 mg total) by mouth every morning. 30 capsule 1  . fluticasone (FLONASE) 50 MCG/ACT nasal spray Place 2 sprays into both nostrils as needed (nasal stuffiness). 16 g 3  . gabapentin (NEURONTIN) 300 MG capsule Take 300 mg by mouth at bedtime.    . Glycopyrrolate-Formoterol (BEVESPI AEROSPHERE) 9-4.8 MCG/ACT AERO Inhale 1 puff into the lungs 2 (two) times daily. 1 Inhaler 0  . guaiFENesin (MUCINEX) 600 MG 12 hr tablet Take 600 mg by mouth 2 (two) times daily as needed for cough.     [name] HYDROcodone-acetaminophen (NORCO/VICODIN) 5-325 MG tablet Take 1 tablet by mouth at bedtime as needed. 10 tablet 0  . hydrocortisone cream 1 % Apply 1 application topically daily as needed for itching.    . Iron-Vitamins (THEREMS H PO) Take 1 tablet by mouth daily.    [name] levothyroxine (SYNTHROID, LEVOTHROID) 75 MCG tablet Take 1 tablet (75 mcg total) by mouth every morning. 90 tablet 0  . loratadine (CLARITIN) 10 MG tablet Take 1 tablet (10 mg total) by mouth daily as needed (drainage, drippy nose). 30 tablet 9  . metFORMIN (GLUCOPHAGE) 500 MG tablet Take 500 mg by mouth 2 (two) times daily with a meal.     . Multiple Vitamins-Minerals (THEREMS-M) TABS Take 1 tablet by mouth daily.    . nitroGLYCERIN (NITRODUR - DOSED  IN MG/24 HR) 0.6 mg/hr patch Apply patch every morning and remove at bedtime    . nitroGLYCERIN (NITROSTAT) 0.4 MG SL tablet Place 0.4 mg under the tongue every 5 (five) minutes as needed for chest pain.    . nizatidine (AXID) 150 MG capsule Take 1 capsule by mouth 2 (two) times daily.    [name] omeprazole (PRILOSEC) 20 MG capsule Take 20 mg by mouth daily.     . polyethylene glycol powder (GLYCOLAX/MIRALAX) powder Take 17 g by mouth daily.     . polyvinyl alcohol (ARTIFICIAL TEARS) 1.4 % ophthalmic solution Place 1 drop into both eyes 3 (three) times daily as needed for dry eyes.    . SF 1.1 %  GEL dental gel Place 1 drop onto teeth 3 (three) times daily.     . simethicone (GAS-X EXTRA STRENGTH) 125 MG chewable tablet Chew 1-2 tablets (125-250 mg total) by mouth 3 (three) times daily with meals. Max 4 tablets daily (Patient taking differently: Chew 125-250 mg by mouth 4 (four) times daily as needed (gas). Max 4 tablets daily) 30 tablet 0  . sodium chloride (OCEAN) 0.65 % SOLN nasal spray Place 1 spray into both nostrils as needed for congestion.     No current facility-administered medications for this visit.     Allergies: Reglan [metoclopramide] and Sulfa antibiotics  Past Medical History:  Diagnosis Date  . Asthma   . Atrial fibrillation (HCC)   . Chest pain   . Diabetes mellitus without complication (HCC)   . GERD (gastroesophageal reflux disease)   . Hernia, hiatal   . Hyperlipidemia   . Hypertension   . Hypothyroid     Past Surgical History:  Procedure Laterality Date  . APPENDECTOMY    . CATARACT EXTRACTION    . HIATAL HERNIA REPAIR    . PERCUTANEOUS CORONARY STENT INTERVENTION (PCI-S)      Family History  Problem Relation Age of Onset  . Hypertension Mother   . Stroke Mother   . Ovarian cancer Mother   . Stroke Father   . Diabetes Son     Social History   Tobacco Use  . Smoking status: Former Smoker    Packs/day: 0.50    Years: 7.00    Pack years: 3.50    Types: Pipe, Cigars    Last attempt to quit: 04/27/1968    Years since quitting: 49.8  . Smokeless tobacco: Never Used  Substance Use Topics  . Alcohol use: No    Alcohol/week: 0.0 standard drinks     Subjective:  Benjamin Mccall is here today for f/u of weakness, last seen here on 01/10/18 for ED follow up after a fall with humerus fracture, he told me that he had several recent episodes of feeling he might fall or falling, described as "gravity just pulling him to the ground." his BP was on the lower end of normal at his 01/10/18 OV so we decided to hold his HCTZ to see if his symptoms improved. He  tells me today he has stopped HCTZ as instructed, now only maintained on cardizem 120 daily for HTN, which he takes daily as prescribed. He said hes not had any more falls, but has had a few episodes of feeling he might fall backwards while ambulating with his walker. He is scheduled to start PT tomorrow for his shoulder fracture and has continued to f/u with orthopedics for this injury. He does tell me hes noticed slightly higher BP readings since stopping the HCTZ He  denies fevers, chills, syncope, confusion, slurred speech, chest pain, shortness of breath, abdominal pain, abnormal bruising or bleeding  BP Readings from Last 3 Encounters:  02/09/18 (!) 160/70  01/10/18 116/62  01/06/18 (!) 158/66   ROS- See HPI  Objective:  Vitals:   02/09/18 0950  BP: (!) 160/70  Pulse: (!) 58  SpO2: 97%  Weight: 158 lb (71.7 kg)  Height: 5\' 8"  (1.727 m)    General: Well developed, well nourished, in no acute distress  Skin : Warm and dry.  Head: Normocephalic and atraumatic  Eyes: Sclera and conjunctiva clear; pupils round and reactive to light; extraocular movements intact  Oropharynx: Pink, supple.  Neck: Supple Lungs: Effort normal. No respiratory distress. CVS exam: normal rate and regular rhythm, S1 and S2 normal.  Extremities: distal pulses intact. Neurologic: Alert and oriented; speech intact; face symmetrical; moves all extremities well; CNII-XII intact without focal deficit, ambulatory with walker   Assessment:  1. Weakness   2. Closed nondisplaced fracture of surgical neck of left humerus with routine healing, unspecified fracture morphology, subsequent encounter   3. Personal history of fracture   4. Osteoporosis, unspecified osteoporosis type, unspecified pathological fracture presence     Plan:   Return in about 1 month (around 03/12/2018) for F/U- falls/weakness; DM-check A1c; HTN- recheck BP.  Orders Placed This Encounter  Procedures  . DG Bone Density    Standing  Status:   Future    Standing Expiration Date:   04/12/2019    Order Specific Question:   Reason for Exam (SYMPTOM  OR DIAGNOSIS REQUIRED)    Answer:   recent fall with fracture, screening for osteoporosis    Order Specific Question:   Preferred imaging location?    Answer:   [facility]  . CBC    Standing Status:   Future    Standing Expiration Date:   02/10/2019  . Comprehensive metabolic panel    Standing Status:   Future    Standing Expiration Date:   02/10/2019    Requested Prescriptions    No prescriptions requested or ordered in this encounter    Weakness Update CBC, CMET, have not been checked in about 6 months Could consider neuro rehab for continued symptoms, however he is just starting PT for his shoulder so will wait until this is completed RTC in 1 month for F/U Additional education printed on AVS - CBC; Future - Comprehensive metabolic panel; Future

## 2018-02-10 ENCOUNTER — Inpatient Hospital Stay: Admission: RE | Admit: 2018-02-10 | Payer: Medicare Other | Source: Ambulatory Visit

## 2018-02-25 ENCOUNTER — Ambulatory Visit: Payer: Medicare Other | Admitting: Nurse Practitioner

## 2018-03-10 ENCOUNTER — Other Ambulatory Visit: Payer: Self-pay | Admitting: *Deleted

## 2018-03-10 MED ORDER — HYDROCODONE-ACETAMINOPHEN 5-325 MG PO TABS: 1.0000 | ORAL_TABLET | Freq: Every evening | ORAL | 0 refills | Status: DC | PRN

## 2018-03-11 ENCOUNTER — Ambulatory Visit: Payer: Medicare Other | Admitting: Nurse Practitioner

## 2018-03-18 ENCOUNTER — Other Ambulatory Visit: Payer: Self-pay | Admitting: Nurse Practitioner

## 2018-03-18 ENCOUNTER — Ambulatory Visit: Payer: Medicare Other | Admitting: Nurse Practitioner

## 2018-03-18 ENCOUNTER — Telehealth: Payer: Self-pay | Admitting: *Deleted

## 2018-03-18 NOTE — Telephone Encounter (Signed)
noted 

## 2018-03-18 NOTE — Telephone Encounter (Addendum)
I called pt to follow up about Hydrocodone 5/325 refill request. He states ortho released him yesterday and he is doing PT x 2 more weeks. He states he does not need the Hydrocodone/APAP refill any longer. Signed request with denial[facility]contin[facility]

## 2018-05-09 NOTE — Progress Notes (Signed)
Patient ID: Benjamin PasseyDonald R Sica, male   DOB: March 07, 1930, 85 y.o.   MRN: [id number]   85 y.o. history of Sarcoid, HTN, PAF not on anticoagulation due to age. Distant history of CAD with stents no records available but over 15 years ago. 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  All other systems reviewed and negative   General: Affect appropriate Frail elderly male  HEENT: normal Neck supple with no adenopathy JVP normal no bruits no thyromegaly Lungs clear with no wheezing and good diaphragmatic motion Heart:  S1/S2 benign SEM murmur, no rub, gallop or click PMI normal Abdomen: benighn, BS positve, no tenderness, no AAA no bruit.  No HSM or HJR Distal pulses intact with no bruits Neuro non-focal Skin warm and dry LE varicosities with erythema   Medications Current Outpatient Medications  Medication Sig Dispense Refill  . acetaminophen (TYLENOL) 325 MG tablet Take 1 tablet (325 mg total) by mouth every 6 (six) hours as needed. 30 tablet 0  . albuterol (PROVENTIL HFA;VENTOLIN HFA) 108 (90 BASE) MCG/ACT inhaler Inhale 2 puffs into the lungs every 4 (four) hours as needed for wheezing or shortness of breath.     [name]. aspirin 81 MG tablet Take 81 mg by mouth every morning.     [name]. atorvastatin (LIPITOR) 10 MG tablet Take 1 tablet (10 mg total) by mouth daily. 30 tablet 0  . Cholecalciferol (VITAMIN D3) 1000 UNITS CAPS Take 1,000 Units by mouth every morning.     . citalopram (CELEXA) 10 MG tablet Take 10 mg by mouth every  morning.     . clopidogrel (PLAVIX) 75 MG tablet Take 75 mg by mouth every morning.     [name]. CREON 12000 units CPEP capsule Take 36,000 mg by mouth 3 (three) times daily.     [name]. diltiazem (CARDIZEM CD) 120 MG 24 hr capsule Take 1 capsule (120 mg total) by mouth every morning. 30 capsule 1  . fluticasone (FLONASE) 50 MCG/ACT nasal spray Place 2 sprays into both nostrils as needed (nasal stuffiness). 16 g 3  . gabapentin (NEURONTIN) 300 MG capsule Take 300 mg by mouth at bedtime.    . Glycopyrrolate-Formoterol (BEVESPI AEROSPHERE) 9-4.8 MCG/ACT AERO Inhale 1 puff into the lungs 2 (two) times daily. 1 Inhaler 0  . guaiFENesin (MUCINEX) 600 MG 12 hr tablet Take 600 mg by mouth 2 (two) times daily as needed for cough.     . hydrocortisone cream 1 % Apply 1 application topically daily as needed for itching.    . Iron-Vitamins (THEREMS H PO) Take 1 tablet by mouth daily.    [name]. levothyroxine (SYNTHROID, LEVOTHROID) 75 MCG tablet Take 1 tablet (75 mcg total) by mouth every morning. 90 tablet 0  . loratadine (CLARITIN) 10 MG tablet Take 1 tablet (10 mg total) by mouth daily as needed (drainage, drippy nose). 30 tablet 9  . metFORMIN (GLUCOPHAGE) 500 MG tablet Take 500 mg by mouth 2 (two) times daily with a meal.     . Multiple Vitamins-Minerals (THEREMS-M) TABS Take  1 tablet by mouth daily.    . nitroGLYCERIN (NITRODUR - DOSED IN MG/24 HR) 0.6 mg/hr patch Apply patch every morning and remove at bedtime    . nitroGLYCERIN (NITROSTAT) 0.4 MG SL tablet Place 0.4 mg under the tongue every 5 (five) minutes as needed for chest pain.    . nizatidine (AXID) 150 MG capsule Take 1 capsule by mouth 2 (two) times daily.    [name] omeprazole (PRILOSEC) 20 MG capsule Take 20 mg by mouth daily.     . polyethylene glycol powder (GLYCOLAX/MIRALAX) powder Take 17 g by mouth daily.     . polyvinyl alcohol (ARTIFICIAL TEARS) 1.4 % ophthalmic solution Place 1 drop into both eyes 3 (three) times daily as needed for dry eyes.    . SF 1.1  % GEL dental gel Place 1 drop onto teeth 3 (three) times daily.     . simethicone (GAS-X EXTRA STRENGTH) 125 MG chewable tablet Chew 1-2 tablets (125-250 mg total) by mouth 3 (three) times daily with meals. Max 4 tablets daily (Patient taking differently: Chew 125-250 mg by mouth 4 (four) times daily as needed (gas). Max 4 tablets daily) 30 tablet 0  . sodium chloride (OCEAN) 0.65 % SOLN nasal spray Place 1 spray into both nostrils as needed for congestion.     No current facility-administered medications for this visit.     Allergies Reglan [metoclopramide] and Sulfa antibiotics  Family History: Family History  Problem Relation Age of Onset  . Hypertension Mother   . Stroke Mother   . Ovarian cancer Mother   . Stroke Father   . Diabetes Son     Social History: Social History   Socioeconomic History  . Marital status: Married    Spouse name: Not on file  . Number of children: 2  . Years of education: 64  . Highest education level: Not on file  Occupational History  . 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 Financial resource strain: Not on file  . Food insecurity:    Worry: Not on file    Inability: Not on file  . Transportation needs:    Medical: Not on file    Non-medical: Not on file  Tobacco Use  . Smoking status: Former Smoker    Packs/day: 0.50    Years: 7.00    Pack years: 3.50    Types: Pipe, Cigars    Last attempt to quit: 04/27/1968    Years since quitting: 50.0  . Smokeless tobacco: Never Used  Substance and Sexual Activity  . Alcohol use: No    Alcohol/week: 0.0 standard drinks  . Drug use: No  . Sexual activity: Not on file  Lifestyle  . Physical activity:    Days per week: Not on file    Minutes per session: Not on file  . Stress: Not on file  Relationships  . Social connections:    Talks on phone: Not on file    Gets together: Not on file    Attends religious service: Not on file    Active  member of club or organization: Not on file    Attends meetings of clubs or organizations: Not on file    Relationship status: Not on file  . Intimate partner violence:    Fear of current or ex partner: Not on file    Emotionally abused: Not on file    Physically abused: Not on file  Forced sexual activity: Not on file  Other Topics Concern  . Not on file  Social History Narrative   Born and raised in [place]. Residing in [place] with his wife. Denies any beliefs effecting healthcare.     Past Surgical History:  Procedure Laterality Date  . APPENDECTOMY    . CATARACT EXTRACTION    . HIATAL HERNIA REPAIR    . PERCUTANEOUS CORONARY STENT INTERVENTION (PCI-S)      Past Medical History:  Diagnosis Date  . Asthma   . Atrial fibrillation (HCC)   . Chest pain   . Diabetes mellitus without complication (HCC)   . GERD (gastroesophageal reflux disease)   . Hernia, hiatal   . Hyperlipidemia   . Hypertension   . Hypothyroid     Electrocardiogram:  09/07/14   SR rate 51  LAD PR 230  LVH  12/04/15 SR rate 57 PR 220 LAFB LVH 04/14/17 SB rate 52 PR 232 PVC LVH   Assessment and Plan  CAD" Stable with no angina and good activity level.  Continue medical Rx  PAF: maint NSR with no palpitations   HTN:  Well controlled.  Continue current medications and low sodium Dash type diet.    Sarcoid:  Stable no cardiac involvement f/u [facility]   Dyspnea  Related to asthma / sarcoid EF normal no edema continue inhalers f/u Wert   First Degree Block :  F/u ECG in a year no high grade AV block  Umbilical Hernia: stable small non tender and reducable  Veins: varicose encouraged compression stockings no incidence of phlebitis  DM:  Discussed low carb diet.  Target hemoglobin A1c is 6.5 or less.  Continue current medications. A1c 6.4 07/25/15    Charlton Haws

## 2018-05-10 ENCOUNTER — Ambulatory Visit (INDEPENDENT_AMBULATORY_CARE_PROVIDER_SITE_OTHER): Payer: Medicare Other | Admitting: Cardiovascular Disease

## 2018-05-10 VITALS — BP 132/68 | HR 63 | Ht 68.0 in | Wt 163.0 lb

## 2018-05-10 DIAGNOSIS — R0600 Dyspnea, unspecified: Secondary | ICD-10-CM

## 2018-05-10 DIAGNOSIS — I2510 Atherosclerotic heart disease of native coronary artery without angina pectoris: Secondary | ICD-10-CM | POA: Diagnosis not present

## 2018-05-10 DIAGNOSIS — I440 Atrioventricular block, first degree: Secondary | ICD-10-CM

## 2018-05-10 DIAGNOSIS — I2583 Coronary atherosclerosis due to lipid rich plaque: Secondary | ICD-10-CM | POA: Diagnosis not present

## 2018-05-10 DIAGNOSIS — I10 Essential (primary) hypertension: Secondary | ICD-10-CM | POA: Diagnosis not present

## 2018-05-10 DIAGNOSIS — I480 Paroxysmal atrial fibrillation: Secondary | ICD-10-CM

## 2018-05-10 NOTE — Patient Instructions (Addendum)

## 2018-07-01 ENCOUNTER — Other Ambulatory Visit: Payer: Self-pay | Admitting: *Deleted

## 2018-07-01 MED ORDER — FAMOTIDINE 20 MG PO TABS: 20.0000 mg | ORAL_TABLET | Freq: Two times a day (BID) | ORAL | 2 refills | Status: AC

## 2018-07-01 NOTE — Telephone Encounter (Signed)
Received fax from [facility] at [facility] stating nizatidine 150 mg is no longer available from pharmacy. They request new Rx for replacement. New Rx sent per PCP. See meds.

## 2018-08-23 ENCOUNTER — Telehealth: Payer: Self-pay | Admitting: Family

## 2018-08-23 NOTE — Telephone Encounter (Signed)
Please see who is prescribing the Metformin for him; it is not our office; his facility is requesting Hgba1c; they need to contact that provider.

## 2018-08-24 NOTE — Telephone Encounter (Signed)
Form faxed to facility noting we do not prescribe his metformin.

## 2018-09-22 ENCOUNTER — Ambulatory Visit: Payer: Medicare Other | Admitting: Internal Medicine

## 2018-11-10 ENCOUNTER — Ambulatory Visit: Payer: Medicare Other | Admitting: Internal Medicine

## 2018-11-28 ENCOUNTER — Telehealth: Payer: Self-pay

## 2018-11-28 NOTE — Telephone Encounter (Signed)
Appointment has been canceled and pt will call back to reschedule.

## 2018-11-29 ENCOUNTER — Ambulatory Visit: Payer: Medicare Other | Admitting: Registered Nurse

## 2018-12-13 ENCOUNTER — Ambulatory Visit: Payer: Medicare Other | Admitting: Registered Nurse

## 2018-12-16 ENCOUNTER — Ambulatory Visit (INDEPENDENT_AMBULATORY_CARE_PROVIDER_SITE_OTHER): Payer: Medicare Other | Admitting: Registered Nurse

## 2018-12-16 ENCOUNTER — Other Ambulatory Visit: Payer: Self-pay

## 2018-12-16 ENCOUNTER — Encounter: Payer: Self-pay | Admitting: Registered Nurse

## 2018-12-16 VITALS — BP 140/70 | HR 60 | Temp 98.3°F | Resp 16 | Ht 64.37 in | Wt 161.0 lb

## 2018-12-16 DIAGNOSIS — E119 Type 2 diabetes mellitus without complications: Secondary | ICD-10-CM

## 2018-12-16 DIAGNOSIS — Z1329 Encounter for screening for other suspected endocrine disorder: Secondary | ICD-10-CM

## 2018-12-16 DIAGNOSIS — E785 Hyperlipidemia, unspecified: Secondary | ICD-10-CM | POA: Diagnosis not present

## 2018-12-16 DIAGNOSIS — Z6827 Body mass index (BMI) 27.0-27.9, adult: Secondary | ICD-10-CM | POA: Diagnosis not present

## 2018-12-16 DIAGNOSIS — Z130 Encounter for screening for diseases of the blood and blood-forming organs and certain disorders involving the immune mechanism: Secondary | ICD-10-CM

## 2018-12-16 DIAGNOSIS — Z13228 Encounter for screening for other metabolic disorders: Secondary | ICD-10-CM

## 2018-12-16 NOTE — Patient Instructions (Signed)
° ° ° °  If you have lab work done today you will be contacted with your lab results within the next 2 weeks.  If you have not heard from us then please contact us. The fastest way to get your results is to register for My Chart. ° ° °IF you received an x-ray today, you will receive an invoice from [facility]. Please contact [facility] at [phone] with questions or concerns regarding your invoice.  ° °IF you received labwork today, you will receive an invoice from [facility]. Please contact [facility] at [phone] with questions or concerns regarding your invoice.  ° °Our billing staff will not be able to assist you with questions regarding bills from these companies. ° °You will be contacted with the lab results as soon as they are available. The fastest way to get your results is to activate your My Chart account. Instructions are located on the last page of this paperwork. If you have not heard from us regarding the results in 2 weeks, please contact this office. °  ° ° ° °

## 2018-12-16 NOTE — Progress Notes (Signed)
Established Patient Office Visit  Subjective:  Patient ID: Benjamin Mccall, male    DOB: 1929/09/03  Age: 85 y.o. MRN: [id number]  CC:  Chief Complaint  Patient presents with  . Establish Care    need new pcp to manage chronic conditions and medications     HPI Benjamin PasseyDonald R Bordenave presents for visit to establish care. [facility], but his PCP left their clinic.   He reports that he feels well overall. He resides in an SNF with his wife, where his medication is managed for him and he is has vitals and blood sugars taken regularly.   He is unaware if he needs refills at this time - he was instructed to have his SNF call us for refills should he need any.  His major concern at the moment is for his wife's deteriorating health. He was tearful when talking about this, but says he has people he can trust and talk to at his SNF.    Past Medical History:  Diagnosis Date  . Asthma   . Atrial fibrillation (HCC)   . Chest pain   . Diabetes mellitus without complication (HCC)   . GERD (gastroesophageal reflux disease)   . Hernia, hiatal   . Hyperlipidemia   . Hypertension   . Hypothyroid     Past Surgical History:  Procedure Laterality Date  . APPENDECTOMY    . CATARACT EXTRACTION    . HIATAL HERNIA REPAIR    . PERCUTANEOUS CORONARY STENT INTERVENTION (PCI-S)      Family History  Problem Relation Age of Onset  . Hypertension Mother   . Stroke Mother   . Ovarian cancer Mother   . Stroke Father   . Diabetes Son     Social History   Socioeconomic History  . Marital status: Married    Spouse name: Not on file  . Number of children: 2  . Years of education: 7412  . Highest education level: Not on file  Occupational History  . 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 Financial resource strain: Not on file  . Food insecurity    Worry: Not on file    Inability: Not  on file  . Transportation needs    Medical: Not on file    Non-medical: Not on file  Tobacco Use  . Smoking status: Former Smoker    Packs/day: 0.50    Years: 7.00    Pack years: 3.50    Types: Pipe, Cigars    Quit date: 04/27/1968    Years since quitting: 50.6  . Smokeless tobacco: Never Used  Substance and Sexual Activity  . Alcohol use: No    Alcohol/week: 0.0 standard drinks  . Drug use: No  . Sexual activity: Not on file  Lifestyle  . Physical activity    Days per week: Not on file    Minutes per session: Not on file  . Stress: Not on file  Relationships  . Social Musicianconnections    Talks on phone: Not on file    Gets together: Not on file    Attends religious service: Not on file    Active member of club or organization: Not on file    Attends meetings of clubs or organizations: Not on file    Relationship status: Not on file  . Intimate partner violence    Fear  of current or ex partner: Not on file    Emotionally abused: Not on file    Physically abused: Not on file    Forced sexual activity: Not on file  Other Topics Concern  . Not on file  Social History Narrative   [place]. Residing in [place] [facility] with his wife. Denies any beliefs effecting healthcare.     Outpatient Medications Prior to Visit  Medication Sig Dispense Refill  . acetaminophen (TYLENOL) 325 MG tablet Take 1 tablet (325 mg total) by mouth every 6 (six) hours as needed. 30 tablet 0  . albuterol (PROVENTIL HFA;VENTOLIN HFA) 108 (90 BASE) MCG/ACT inhaler Inhale 2 puffs into the lungs every 4 (four) hours as needed for wheezing or shortness of breath.     [name]. aspirin 81 MG tablet Take 81 mg by mouth every morning.     [name]. atorvastatin (LIPITOR) 10 MG tablet Take 1 tablet (10 mg total) by mouth daily. 30 tablet 0  . Cholecalciferol (VITAMIN D3) 1000 UNITS CAPS Take 1,000 Units by mouth every morning.     . citalopram (CELEXA) 10 MG tablet Take 10 mg by mouth every morning.      . clopidogrel (PLAVIX) 75 MG tablet Take 75 mg by mouth every morning.     [name]. CREON 12000 units CPEP capsule Take 36,000 mg by mouth 3 (three) times daily.     [name]. diltiazem (CARDIZEM CD) 120 MG 24 hr capsule Take 1 capsule (120 mg total) by mouth every morning. 30 capsule 1  . famotidine (PEPCID) 20 MG tablet Take 1 tablet (20 mg total) by mouth 2 (two) times daily. 60 tablet 2  . fluticasone (FLONASE) 50 MCG/ACT nasal spray Place 2 sprays into both nostrils as needed (nasal stuffiness). 16 g 3  . gabapentin (NEURONTIN) 300 MG capsule Take 300 mg by mouth at bedtime.    . Glycopyrrolate-Formoterol (BEVESPI AEROSPHERE) 9-4.8 MCG/ACT AERO Inhale 1 puff into the lungs 2 (two) times daily. 1 Inhaler 0  . guaiFENesin (MUCINEX) 600 MG 12 hr tablet Take 600 mg by mouth 2 (two) times daily as needed for cough.     . hydrocortisone cream 1 % Apply 1 application topically daily as needed for itching.    . Iron-Vitamins (THEREMS H PO) Take 1 tablet by mouth daily.    [name]. levothyroxine (SYNTHROID, LEVOTHROID) 75 MCG tablet Take 1 tablet (75 mcg total) by mouth every morning. 90 tablet 0  . loratadine (CLARITIN) 10 MG tablet Take 1 tablet (10 mg total) by mouth daily as needed (drainage, drippy nose). 30 tablet 9  . metFORMIN (GLUCOPHAGE) 500 MG tablet Take 500 mg by mouth 2 (two) times daily with a meal.     . Multiple Vitamins-Minerals (THEREMS-M) TABS Take 1 tablet by mouth daily.    . nitroGLYCERIN (NITRODUR - DOSED IN MG/24 HR) 0.6 mg/hr patch Apply patch every morning and remove at bedtime    . nitroGLYCERIN (NITROSTAT) 0.4 MG SL tablet Place 0.4 mg under the tongue every 5 (five) minutes as needed for chest pain.    [name]. omeprazole (PRILOSEC) 20 MG capsule Take 20 mg by mouth daily.     . polyethylene glycol powder (GLYCOLAX/MIRALAX) powder Take 17 g by mouth daily.     . polyvinyl alcohol (ARTIFICIAL TEARS) 1.4 % ophthalmic solution Place 1 drop into both eyes 3 (three) times daily as needed for dry eyes.     . SF 1.1 % GEL dental gel Place 1  drop onto teeth 3 (three) times daily.     . simethicone (GAS-X EXTRA STRENGTH) 125 MG chewable tablet Chew 1-2 tablets (125-250 mg total) by mouth 3 (three) times daily with meals. Max 4 tablets daily (Patient taking differently: Chew 125-250 mg by mouth 4 (four) times daily as needed (gas). Max 4 tablets daily) 30 tablet 0  . sodium chloride (OCEAN) 0.65 % SOLN nasal spray Place 1 spray into both nostrils as needed for congestion.     No facility-administered medications prior to visit.     Allergies  Allergen Reactions  . Reglan [Metoclopramide]     Tremors   . Sulfa Antibiotics Other (See Comments)    shakes    ROS Review of Systems  Constitutional: Negative.   HENT: Negative.   Eyes: Negative.   Respiratory: Negative.   Cardiovascular: Negative.   Gastrointestinal: Negative.   Endocrine: Negative.   Genitourinary: Negative.   Musculoskeletal: Negative.   Skin: Negative.   Allergic/Immunologic: Negative.   Neurological: Negative.   Hematological: Negative.   Psychiatric/Behavioral: Negative.   All other systems reviewed and are negative.     Objective:    Physical Exam  Constitutional: He is oriented to person, place, and time. He appears well-developed and well-nourished.  HENT:  Head: Normocephalic and atraumatic.  Right Ear: External ear normal.  Left Ear: External ear normal.  Nose: Nose normal.  Mouth/Throat: Oropharynx is clear and moist. No oropharyngeal exudate.  Cardiovascular: Normal rate, regular rhythm, normal heart sounds and intact distal pulses. Exam reveals no gallop and no friction rub.  No murmur heard. Pulmonary/Chest: Effort normal and breath sounds normal. No respiratory distress. He has no wheezes. He has no rales. He exhibits no tenderness.  Abdominal: Soft. Bowel sounds are normal.  Neurological: He is alert and oriented to person, place, and time. No cranial nerve deficit.  Skin: Skin is warm and dry.  No rash noted. No erythema. No pallor.  Psychiatric: He has a normal mood and affect. His behavior is normal. Judgment and thought content normal.  Nursing note and vitals reviewed.   BP 140/70   Pulse 60   Temp 98.3 F (36.8 C) (Oral)   Resp 16   Ht 5' 4.37" (1.635 m)   Wt 161 lb (73 kg)   SpO2 96%   BMI 27.32 kg/m  Wt Readings from Last 3 Encounters:  12/16/18 161 lb (73 kg)  05/10/18 163 lb (73.9 kg)  02/09/18 158 lb (71.7 kg)     Health Maintenance Due  Topic Date Due  . URINE MICROALBUMIN  01/22/2017  . HEMOGLOBIN A1C  03/05/2018  . OPHTHALMOLOGY EXAM  08/06/2018    There are no preventive care reminders to display for this patient.  Lab Results  Component Value Date   TSH 1.27 01/10/2018   Lab Results  Component Value Date   WBC 7.7 02/09/2018   HGB 10.9 (L) 02/09/2018   HCT 32.0 (L) 02/09/2018   MCV 86.2 02/09/2018   PLT 324.0 02/09/2018   Lab Results  Component Value Date   NA 138 02/09/2018   K 4.1 02/09/2018   CO2 28 02/09/2018   GLUCOSE 100 (H) 02/09/2018   BUN 23 02/09/2018   CREATININE 1.40 02/09/2018   BILITOT 0.5 02/09/2018   ALKPHOS 72 02/09/2018   AST 16 02/09/2018   ALT 11 02/09/2018   PROT 7.1 02/09/2018   ALBUMIN 3.8 02/09/2018   CALCIUM 9.2 02/09/2018   ANIONGAP 7 08/11/2017   GFR 51.10 (L) 02/09/2018  Lab Results  Component Value Date   CHOL 169 10/25/2017   Lab Results  Component Value Date   HDL 57.30 10/25/2017   Lab Results  Component Value Date   LDLCALC 92 10/25/2017   Lab Results  Component Value Date   TRIG 96.0 10/25/2017   Lab Results  Component Value Date   CHOLHDL 3 10/25/2017   Lab Results  Component Value Date   HGBA1C 6.3 09/02/2017      Assessment & Plan:   Problem List Items Addressed This Visit      Endocrine   Controlled type 2 diabetes mellitus (HCC) - Primary   Relevant Orders   Hemoglobin A1c   Microalbumin, urine     Other   Hyperlipidemia   Relevant Orders   Lipid panel     Other Visit Diagnoses    Screening for endocrine, metabolic and immunity disorder       Relevant Orders   CBC with Differential/Platelet   Comprehensive metabolic panel   TSH   Body mass index (bmi) 27.0-27.9, adult        Relevant Orders   CBC with Differential/Platelet      No orders of the defined types were placed in this encounter.   Follow-up: No follow-ups on file.   PLAN  Labs ordered including CBC, CMP, A1c, Lipid Panel, TSH, and urine microalbumin. Will follow up as warranted  Has already received this year's flu vaccine  No concerns on brief exam  Would like to see him in around 6 months for follow up  Patient encouraged to call clinic with any questions, comments, or concerns.   Janeece Ageeichard Jessika Rothery, NP

## 2018-12-17 LAB — CBC WITH DIFFERENTIAL/PLATELET
Basophils Absolute: 0 10*3/uL (ref 0.0–0.2)
Basos: 0 %
EOS (ABSOLUTE): 0.2 10*3/uL (ref 0.0–0.4)
Eos: 3 %
Hematocrit: 32.2 % — ABNORMAL LOW (ref 37.5–51.0)
Hemoglobin: 10.8 g/dL — ABNORMAL LOW (ref 13.0–17.7)
Immature Grans (Abs): 0 10*3/uL (ref 0.0–0.1)
Immature Granulocytes: 0 %
Lymphocytes Absolute: 1.7 10*3/uL (ref 0.7–3.1)
Lymphs: 24 %
MCH: 28.1 pg (ref 26.6–33.0)
MCHC: 33.5 g/dL (ref 31.5–35.7)
MCV: 84 fL (ref 79–97)
Monocytes Absolute: 0.7 10*3/uL (ref 0.1–0.9)
Monocytes: 10 %
Neutrophils Absolute: 4.4 10*3/uL (ref 1.4–7.0)
Neutrophils: 63 %
Platelets: 246 10*3/uL (ref 150–450)
RBC: 3.84 x10E6/uL — ABNORMAL LOW (ref 4.14–5.80)
RDW: 13.6 % (ref 11.6–15.4)
WBC: 7 10*3/uL (ref 3.4–10.8)

## 2018-12-17 LAB — LIPID PANEL
Chol/HDL Ratio: 2.8 ratio (ref 0.0–5.0)
Cholesterol, Total: 167 mg/dL (ref 100–199)
HDL: 59 mg/dL (ref >39)
LDL Calculated: 91 mg/dL (ref 0–99)
Triglycerides: 85 mg/dL (ref 0–149)
VLDL Cholesterol Cal: 17 mg/dL (ref 5–40)

## 2018-12-17 LAB — COMPREHENSIVE METABOLIC PANEL
ALT: 12 IU/L (ref 0–44)
AST: 18 IU/L (ref 0–40)
Albumin/Globulin Ratio: 1.5 (ref 1.2–2.2)
Albumin: 4 g/dL (ref 3.6–4.6)
Alkaline Phosphatase: 75 IU/L (ref 39–117)
BUN/Creatinine Ratio: 14 (ref 10–24)
BUN: 21 mg/dL (ref 8–27)
Bilirubin Total: 0.4 mg/dL (ref 0.0–1.2)
CO2: 24 mmol/L (ref 20–29)
Calcium: 9 mg/dL (ref 8.6–10.2)
Chloride: 103 mmol/L (ref 96–106)
Creatinine, Ser: 1.52 mg/dL — ABNORMAL HIGH (ref 0.76–1.27)
GFR calc Af Amer: 47 mL/min/{1.73_m2} — ABNORMAL LOW (ref >59)
GFR calc non Af Amer: 41 mL/min/{1.73_m2} — ABNORMAL LOW (ref >59)
Globulin, Total: 2.7 g/dL (ref 1.5–4.5)
Glucose: 93 mg/dL (ref 65–99)
Potassium: 4.4 mmol/L (ref 3.5–5.2)
Sodium: 141 mmol/L (ref 134–144)
Total Protein: 6.7 g/dL (ref 6.0–8.5)

## 2018-12-17 LAB — MICROALBUMIN, URINE: Microalbumin, Urine: 17.7 ug/mL

## 2018-12-17 LAB — HEMOGLOBIN A1C
Est. average glucose Bld gHb Est-mCnc: 123 mg/dL
Hgb A1c MFr Bld: 5.9 % — ABNORMAL HIGH (ref 4.8–5.6)

## 2018-12-17 LAB — TSH: TSH: 1.46 u[IU]/mL (ref 0.450–4.500)

## 2018-12-19 ENCOUNTER — Encounter: Payer: Self-pay | Admitting: Registered Nurse

## 2018-12-19 NOTE — Progress Notes (Signed)
Results letter sent to patient via MyChart  Rich Aleana Fifita, NP 

## 2019-01-26 ENCOUNTER — Encounter: Payer: Self-pay | Admitting: Registered Nurse

## 2019-01-26 LAB — HM DIABETES EYE EXAM

## 2019-06-20 ENCOUNTER — Encounter: Payer: Self-pay | Admitting: Registered Nurse

## 2019-07-12 IMAGING — DX DG CHEST 2V
2 series · 2 of 2 positions shown · non-contrast
Comparison: 08/17/2016

CLINICAL DATA: Dyspnea on exertion for 2 weeks

EXAM:
CHEST  2 VIEW

[chest pa]
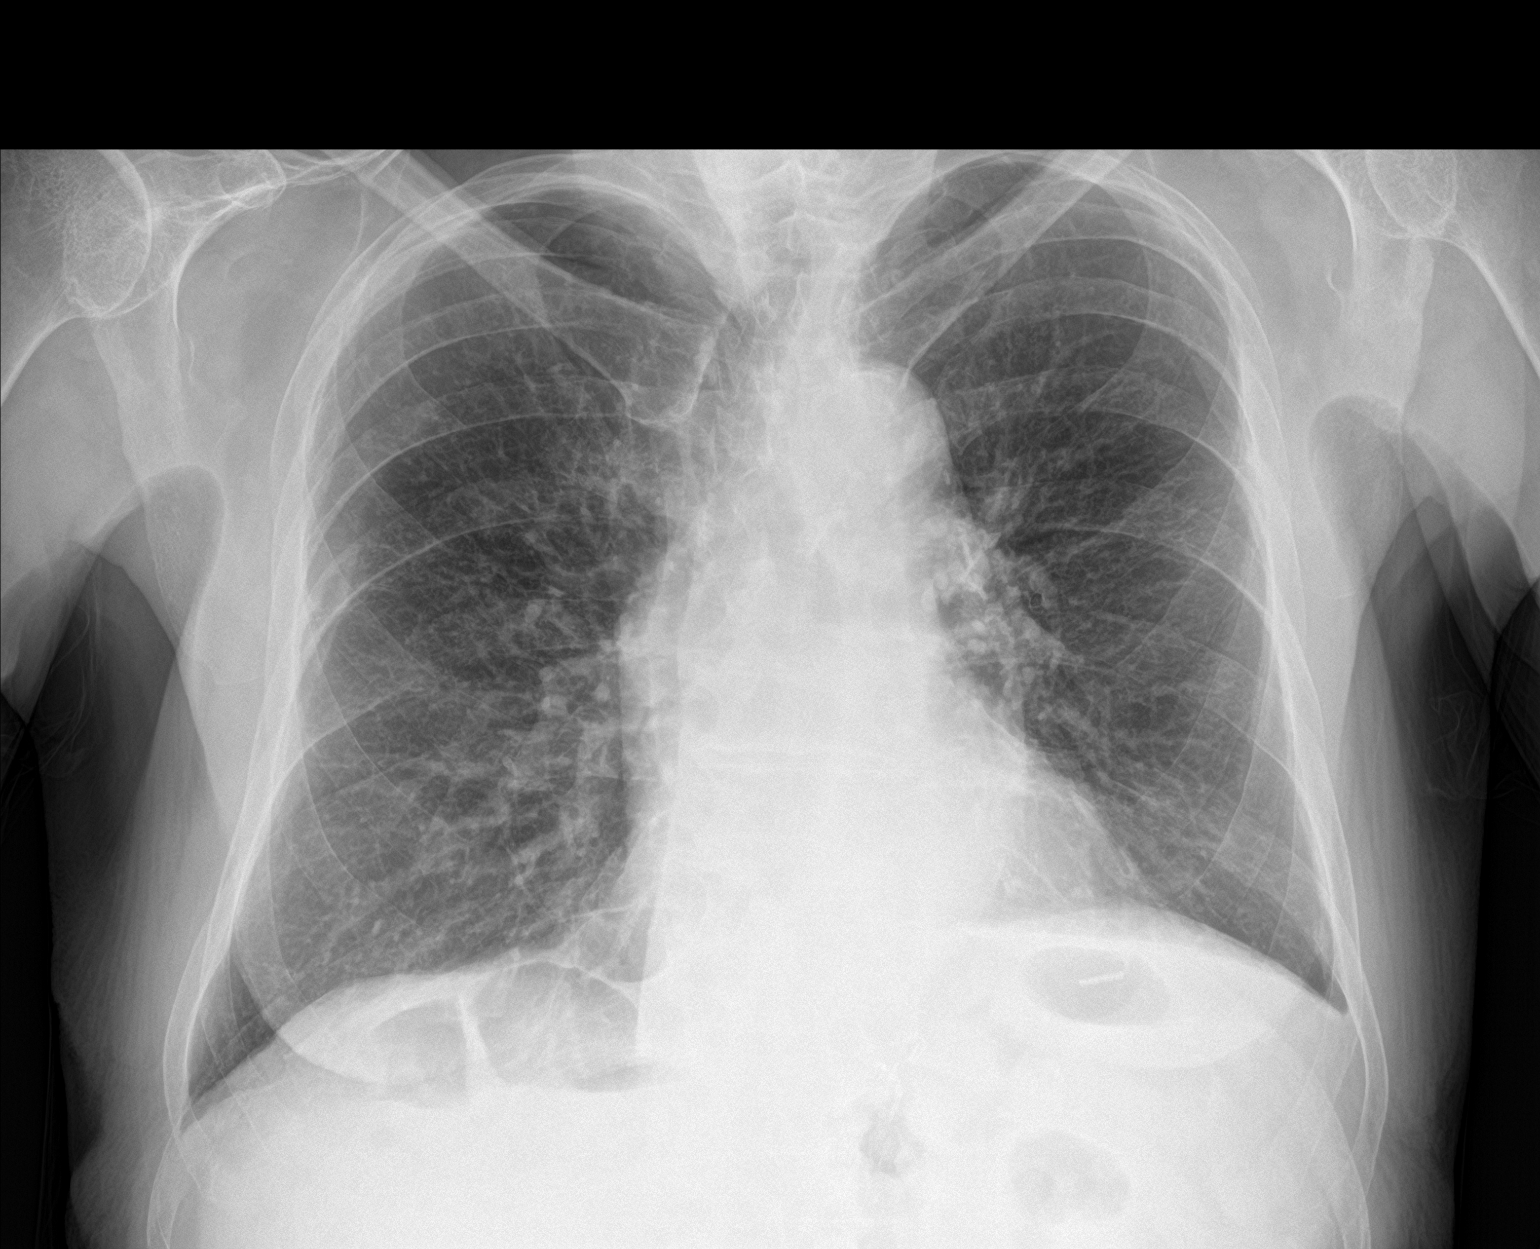

[chest lat]
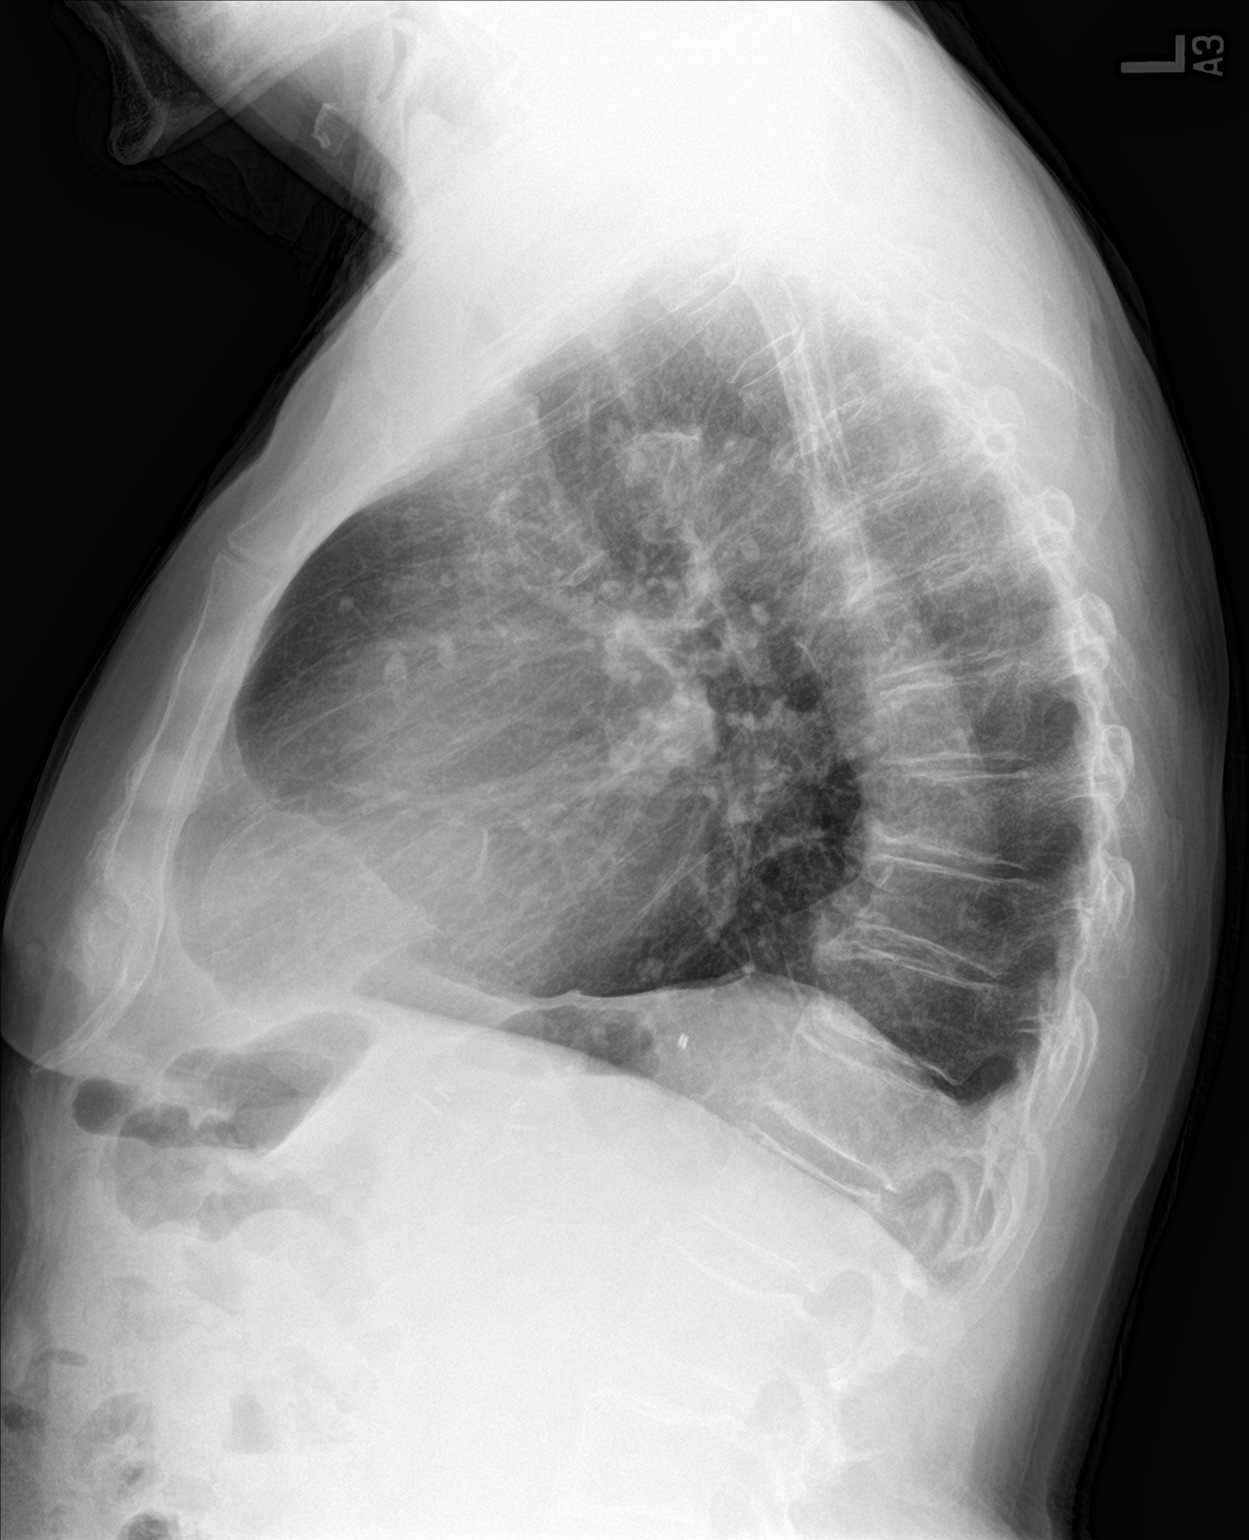

[2 of 2 positions shown; findings below may reference images not displayed]

FINDINGS: Cardiac shadow is within normal limits. Aortic calcifications are
again seen. The lungs are well aerated bilaterally. No focal
infiltrate or sizable effusion is seen. Degenerative changes of
thoracic spine are noted. Prior healed right rib fractures are seen.
IMPRESSION: No acute abnormality noted.

## 2019-08-14 ENCOUNTER — Encounter: Payer: Self-pay | Admitting: Registered Nurse

## 2019-09-07 ENCOUNTER — Telehealth: Payer: Self-pay | Admitting: Registered Nurse

## 2019-09-07 NOTE — Telephone Encounter (Signed)
I called this pt to follow up, na. Also sent message to scheduling pool for mychart appt

## 2019-09-07 NOTE — Telephone Encounter (Signed)
Received a fax from [facility] about pt having feeling dizzy around 12:25 am from his MT. Pt also stated to his BP has been hight. MT stated Pt doesn't have PRN meds for dizziness. MT checked BP and results were: L 210/11 P-45 R wright_ 223/96, P44  MT is wanting to know what to do. Please advise.

## 2019-09-08 NOTE — Telephone Encounter (Signed)
Yes - pt will need in office appt. I see he is scheduled for visit on 09/18/19 - if we can move this sooner, that would be ideal. If not, no concerns.   Thank you  Jari Sportsman, NP

## 2019-09-08 NOTE — Telephone Encounter (Signed)
Pt rescheduled for 5/18.

## 2019-09-12 ENCOUNTER — Ambulatory Visit: Payer: Medicare Other | Admitting: Registered Nurse

## 2019-09-13 ENCOUNTER — Ambulatory Visit (INDEPENDENT_AMBULATORY_CARE_PROVIDER_SITE_OTHER): Payer: Medicare Other | Admitting: Registered Nurse

## 2019-09-13 ENCOUNTER — Other Ambulatory Visit: Payer: Self-pay

## 2019-09-13 ENCOUNTER — Encounter: Payer: Self-pay | Admitting: Registered Nurse

## 2019-09-13 VITALS — BP 173/72 | HR 52 | Temp 97.9°F | Resp 17 | Ht 64.0 in | Wt 157.8 lb

## 2019-09-13 DIAGNOSIS — E119 Type 2 diabetes mellitus without complications: Secondary | ICD-10-CM

## 2019-09-13 DIAGNOSIS — R42 Dizziness and giddiness: Secondary | ICD-10-CM

## 2019-09-13 DIAGNOSIS — I2583 Coronary atherosclerosis due to lipid rich plaque: Secondary | ICD-10-CM

## 2019-09-13 DIAGNOSIS — I2510 Atherosclerotic heart disease of native coronary artery without angina pectoris: Secondary | ICD-10-CM | POA: Diagnosis not present

## 2019-09-13 NOTE — Patient Instructions (Signed)
° ° ° °  If you have lab work done today you will be contacted with your lab results within the next 2 weeks.  If you have not heard from us then please contact us. The fastest way to get your results is to register for My Chart. ° ° °IF you received an x-ray today, you will receive an invoice from [facility]. Please contact [facility] at [phone] with questions or concerns regarding your invoice.  ° °IF you received labwork today, you will receive an invoice from [facility]. Please contact [facility] at [phone] with questions or concerns regarding your invoice.  ° °Our billing staff will not be able to assist you with questions regarding bills from these companies. ° °You will be contacted with the lab results as soon as they are available. The fastest way to get your results is to activate your My Chart account. Instructions are located on the last page of this paperwork. If you have not heard from us regarding the results in 2 weeks, please contact this office. °  ° ° ° °

## 2019-09-14 LAB — COMPREHENSIVE METABOLIC PANEL
ALT: 11 IU/L (ref 0–44)
AST: 20 IU/L (ref 0–40)
Albumin/Globulin Ratio: 1.4 (ref 1.2–2.2)
Albumin: 3.8 g/dL (ref 3.6–4.6)
Alkaline Phosphatase: 71 IU/L (ref 48–121)
BUN/Creatinine Ratio: 16 (ref 10–24)
BUN: 24 mg/dL (ref 8–27)
Bilirubin Total: 0.5 mg/dL (ref 0.0–1.2)
CO2: 24 mmol/L (ref 20–29)
Calcium: 9.1 mg/dL (ref 8.6–10.2)
Chloride: 104 mmol/L (ref 96–106)
Creatinine, Ser: 1.52 mg/dL — ABNORMAL HIGH (ref 0.76–1.27)
GFR calc Af Amer: 47 mL/min/{1.73_m2} — ABNORMAL LOW (ref >59)
GFR calc non Af Amer: 41 mL/min/{1.73_m2} — ABNORMAL LOW (ref >59)
Globulin, Total: 2.8 g/dL (ref 1.5–4.5)
Glucose: 93 mg/dL (ref 65–99)
Potassium: 4.4 mmol/L (ref 3.5–5.2)
Sodium: 140 mmol/L (ref 134–144)
Total Protein: 6.6 g/dL (ref 6.0–8.5)

## 2019-09-14 LAB — CBC WITH DIFFERENTIAL/PLATELET
Basophils Absolute: 0 10*3/uL (ref 0.0–0.2)
Basos: 0 %
EOS (ABSOLUTE): 0.2 10*3/uL (ref 0.0–0.4)
Eos: 3 %
Hematocrit: 33.1 % — ABNORMAL LOW (ref 37.5–51.0)
Hemoglobin: 10.7 g/dL — ABNORMAL LOW (ref 13.0–17.7)
Immature Grans (Abs): 0 10*3/uL (ref 0.0–0.1)
Immature Granulocytes: 0 %
Lymphocytes Absolute: 1.6 10*3/uL (ref 0.7–3.1)
Lymphs: 23 %
MCH: 27.9 pg (ref 26.6–33.0)
MCHC: 32.3 g/dL (ref 31.5–35.7)
MCV: 86 fL (ref 79–97)
Monocytes Absolute: 0.8 10*3/uL (ref 0.1–0.9)
Monocytes: 11 %
Neutrophils Absolute: 4.5 10*3/uL (ref 1.4–7.0)
Neutrophils: 63 %
Platelets: 273 10*3/uL (ref 150–450)
RBC: 3.84 x10E6/uL — ABNORMAL LOW (ref 4.14–5.80)
RDW: 13.7 % (ref 11.6–15.4)
WBC: 7.1 10*3/uL (ref 3.4–10.8)

## 2019-09-14 LAB — HEMOGLOBIN A1C
Est. average glucose Bld gHb Est-mCnc: 128 mg/dL
Hgb A1c MFr Bld: 6.1 % — ABNORMAL HIGH (ref 4.8–5.6)

## 2019-09-14 LAB — LIPID PANEL
Chol/HDL Ratio: 2.7 ratio (ref 0.0–5.0)
Cholesterol, Total: 170 mg/dL (ref 100–199)
HDL: 63 mg/dL (ref >39)
LDL Chol Calc (NIH): 92 mg/dL (ref 0–99)
Triglycerides: 81 mg/dL (ref 0–149)
VLDL Cholesterol Cal: 15 mg/dL (ref 5–40)

## 2019-09-14 LAB — TSH: TSH: 1.02 u[IU]/mL (ref 0.450–4.500)

## 2019-09-14 NOTE — Progress Notes (Signed)
Established Patient Office Visit  Subjective:  Patient ID: Benjamin Mccall, male    DOB: 1929-08-04  Age: 86 y.o. MRN: [id number]  CC:  Chief Complaint  Patient presents with  . Follow-up    patient states last week he felt dizzy a few days and some elevated BP. Per patient he is feeling pretty good this week    HPI Lowella Bandy presents for episodes of dizziness and elevated BP  Lives at [facility] with his wife.  2-3 times in preceding weeks, has experienced sudden dizziness, lightheadedness, and has had to sit down. Denies chest pain, shob, visual changes, claudication, dependent edema, and falls. He has been able to lower himself each time this has happened, or find a seat.   He notes that he has had a history of CAD and afib, as wwell as copd gold ii, t2dm, sarcoidosis without cardiac involvement, and concern for polypharmacy.   Review of previous EKG from around 2 years ago shows marked bradycardia, sinus rhythm, prolonged PR of >230, and L axis deviation, but without acute concern.   Pt appears in good overall health today, here with his grandson, Jerline Pain.  Past Medical History:  Diagnosis Date  . Abdominal distention 02/18/2015  . Asthma   . Atrial fibrillation ([place])   . Chest pain   . Controlled type 2 diabetes mellitus ([place]) 03/13/2014  . Diabetes mellitus without complication ([place])   . GERD (gastroesophageal reflux disease)   . Hernia, hiatal   . Hyperlipidemia   . Hypertension   . Hypothyroid   . Hypothyroidism 03/13/2014   Congenital.   . Rash and nonspecific skin eruption 04/03/2014  . Sarcoidosis 03/13/2014   Followed in Pulmonary clinic/ [facility]/ [facility] - dx in 1980's by surgical bx of lymph node in neck and rx x 10 years with prednisone for cough/sob - 03/27/2014  Walked RA x 3 laps @ 185 ft each stopped due to  End of study, no sob or desat @ slow pace - PFT's 06/29/14  VC 65%  DLCO 65 corrects to 99%     Past Surgical History:  Procedure  Laterality Date  . APPENDECTOMY    . CATARACT EXTRACTION    . HIATAL HERNIA REPAIR    . PERCUTANEOUS CORONARY STENT INTERVENTION (PCI-S)      Family History  Problem Relation Age of Onset  . Hypertension Mother   . Stroke Mother   . Ovarian cancer Mother   . Stroke Father   . Diabetes Son     Social History   Socioeconomic History  . Marital status: Married    Spouse name: Not on file  . Number of children: 2  . Years of education: 23  . Highest education level: Not on file  Occupational History  . 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 Smoking status: Former Smoker    Packs/day: 0.50    Years: 7.00    Pack years: 3.50    Types: Pipe, Cigars    Quit date: 04/27/1968    Years since quitting: 51.4  . Smokeless tobacco: Never Used  Substance and Sexual Activity  . Alcohol use: No    Alcohol/week: 0.0 standard drinks  . Drug use: No  . Sexual activity: Not on file  Other Topics Concern  . Not on file  Social History Narrative   Born and raised in [place]. Residing in [place] with his  wife. Denies any beliefs effecting healthcare.    Social Determinants of Health   Financial Resource Strain:   . Difficulty of Paying Living Expenses:   Food Insecurity:   . Worried About Programme researcher, broadcasting/film/video in the Last Year:   . Barista in the Last Year:   Transportation Needs:   . Freight forwarder (Medical):   [name] Lack of Transportation (Non-Medical):   Physical Activity:   . Days of Exercise per Week:   . Minutes of Exercise per Session:   Stress:   . Feeling of Stress :   Social Connections:   . Frequency of Communication with Friends and Family:   . Frequency of Social Gatherings with Friends and Family:   . Attends Religious Services:   . Active Member of Clubs or Organizations:   . Attends Banker Meetings:   [name] Marital Status:   Intimate Partner Violence:   . Fear of  Current or Ex-Partner:   . Emotionally Abused:   [name] Physically Abused:   . Sexually Abused:     Outpatient Medications Prior to Visit  Medication Sig Dispense Refill  . acetaminophen (TYLENOL) 325 MG tablet Take 1 tablet (325 mg total) by mouth every 6 (six) hours as needed. 30 tablet 0  . albuterol (PROVENTIL HFA;VENTOLIN HFA) 108 (90 BASE) MCG/ACT inhaler Inhale 2 puffs into the lungs every 4 (four) hours as needed for wheezing or shortness of breath.     [name] aspirin 81 MG tablet Take 81 mg by mouth every morning.     [name] atorvastatin (LIPITOR) 10 MG tablet Take 1 tablet (10 mg total) by mouth daily. 30 tablet 0  . Cholecalciferol (VITAMIN D3) 1000 UNITS CAPS Take 1,000 Units by mouth every morning.     . citalopram (CELEXA) 10 MG tablet Take 10 mg by mouth every morning.     . clopidogrel (PLAVIX) 75 MG tablet Take 75 mg by mouth every morning.     [name] CREON 12000 units CPEP capsule Take 36,000 mg by mouth 3 (three) times daily.     [name] diltiazem (CARDIZEM CD) 120 MG 24 hr capsule Take 1 capsule (120 mg total) by mouth every morning. 30 capsule 1  . famotidine (PEPCID) 20 MG tablet Take 1 tablet (20 mg total) by mouth 2 (two) times daily. 60 tablet 2  . fluticasone (FLONASE) 50 MCG/ACT nasal spray Place 2 sprays into both nostrils as needed (nasal stuffiness). 16 g 3  . gabapentin (NEURONTIN) 300 MG capsule Take 300 mg by mouth at bedtime.    . Glycopyrrolate-Formoterol (BEVESPI AEROSPHERE) 9-4.8 MCG/ACT AERO Inhale 1 puff into the lungs 2 (two) times daily. 1 Inhaler 0  . guaiFENesin (MUCINEX) 600 MG 12 hr tablet Take 600 mg by mouth 2 (two) times daily as needed for cough.     . hydrocortisone cream 1 % Apply 1 application topically daily as needed for itching.    . Iron-Vitamins (THEREMS H PO) Take 1 tablet by mouth daily.    [name] levothyroxine (SYNTHROID, LEVOTHROID) 75 MCG tablet Take 1 tablet (75 mcg total) by mouth every morning. 90 tablet 0  . loratadine (CLARITIN) 10 MG tablet Take 1 tablet  (10 mg total) by mouth daily as needed (drainage, drippy nose). 30 tablet 9  . metFORMIN (GLUCOPHAGE) 500 MG tablet Take 500 mg by mouth 2 (two) times daily with a meal.     . Multiple Vitamins-Minerals (THEREMS-M) TABS Take 1 tablet by mouth daily.    [name]  nitroGLYCERIN (NITRODUR - DOSED IN MG/24 HR) 0.6 mg/hr patch Apply patch every morning and remove at bedtime    . nitroGLYCERIN (NITROSTAT) 0.4 MG SL tablet Place 0.4 mg under the tongue every 5 (five) minutes as needed for chest pain.    [name] omeprazole (PRILOSEC) 20 MG capsule Take 20 mg by mouth daily.     . polyethylene glycol powder (GLYCOLAX/MIRALAX) powder Take 17 g by mouth daily.     . polyvinyl alcohol (ARTIFICIAL TEARS) 1.4 % ophthalmic solution Place 1 drop into both eyes 3 (three) times daily as needed for dry eyes.    . SF 1.1 % GEL dental gel Place 1 drop onto teeth 3 (three) times daily.     . simethicone (GAS-X EXTRA STRENGTH) 125 MG chewable tablet Chew 1-2 tablets (125-250 mg total) by mouth 3 (three) times daily with meals. Max 4 tablets daily (Patient taking differently: Chew 125-250 mg by mouth 4 (four) times daily as needed (gas). Max 4 tablets daily) 30 tablet 0  . sodium chloride (OCEAN) 0.65 % SOLN nasal spray Place 1 spray into both nostrils as needed for congestion.     No facility-administered medications prior to visit.    Allergies  Allergen Reactions  . Reglan [Metoclopramide]     Tremors   . Sulfa Antibiotics Other (See Comments)    shakes    ROS Review of Systems  Constitutional: Negative.   HENT: Negative.   Eyes: Negative.   Respiratory: Negative.   Cardiovascular: Negative.   Gastrointestinal: Negative.   Endocrine: Negative.   Genitourinary: Negative.   Musculoskeletal: Negative.   Skin: Negative.   Allergic/Immunologic: Negative.   Neurological: Positive for dizziness. Negative for tremors, seizures, syncope, facial asymmetry, speech difficulty, weakness, light-headedness, numbness and  headaches.  Hematological: Negative.   Psychiatric/Behavioral: Negative.   All other systems reviewed and are negative.     Objective:    Physical Exam  Constitutional: He is oriented to person, place, and time. He appears well-developed and well-nourished. No distress.  HENT:  Head: Normocephalic and atraumatic.  Cardiovascular: Regular rhythm, normal heart sounds and intact distal pulses. Exam reveals no gallop and no friction rub.  No murmur heard. Pulmonary/Chest: Effort normal and breath sounds normal. No respiratory distress. He has no wheezes. He has no rales. He exhibits no tenderness.  Neurological: He is alert and oriented to person, place, and time. No cranial nerve deficit. He exhibits normal muscle tone. Coordination normal.  Skin: Skin is warm and dry. No rash noted. He is not diaphoretic. No erythema. No pallor.  Psychiatric: He has a normal mood and affect. His behavior is normal. Judgment and thought content normal.  Nursing note and vitals reviewed.   BP (!) 173/72   Pulse (!) 52   Temp 97.9 F (36.6 C) (Temporal)   Resp 17   Ht 5\' 4"  (1.626 m)   Wt 157 lb 12.8 oz (71.6 kg)   SpO2 99%   BMI 27.09 kg/m  Wt Readings from Last 3 Encounters:  09/13/19 157 lb 12.8 oz (71.6 kg)  12/16/18 161 lb (73 kg)  05/10/18 163 lb (73.9 kg)     There are no preventive care reminders to display for this patient.  There are no preventive care reminders to display for this patient.  Lab Results  Component Value Date   TSH 1.020 09/13/2019   Lab Results  Component Value Date   WBC 7.1 09/13/2019   HGB 10.7 (L) 09/13/2019   HCT 33.1 (L)  09/13/2019   MCV 86 09/13/2019   PLT 273 09/13/2019   Lab Results  Component Value Date   NA 140 09/13/2019   K 4.4 09/13/2019   CO2 24 09/13/2019   GLUCOSE 93 09/13/2019   BUN 24 09/13/2019   CREATININE 1.52 (H) 09/13/2019   BILITOT 0.5 09/13/2019   ALKPHOS 71 09/13/2019   AST 20 09/13/2019   ALT 11 09/13/2019   PROT 6.6  09/13/2019   ALBUMIN 3.8 09/13/2019   CALCIUM 9.1 09/13/2019   ANIONGAP 7 08/11/2017   GFR 51.10 (L) 02/09/2018   Lab Results  Component Value Date   CHOL 170 09/13/2019   Lab Results  Component Value Date   HDL 63 09/13/2019   Lab Results  Component Value Date   LDLCALC 92 09/13/2019   Lab Results  Component Value Date   TRIG 81 09/13/2019   Lab Results  Component Value Date   CHOLHDL 2.7 09/13/2019   Lab Results  Component Value Date   HGBA1C 6.1 (H) 09/13/2019      Assessment & Plan:   Problem List Items Addressed This Visit      Cardiovascular and Mediastinum   Coronary artery disease   Relevant Orders   TSH (Completed)   Lipid panel (Completed)   Ambulatory referral to Cardiology     Endocrine   Diabetes mellitus without complication (HCC)   Relevant Orders   TSH (Completed)   Hemoglobin A1c (Completed)   CBC with Differential (Completed)    Other Visit Diagnoses    Dizziness    -  Primary   Relevant Orders   TSH (Completed)   Lipid panel (Completed)   Comprehensive metabolic panel (Completed)   Hemoglobin A1c (Completed)   CBC with Differential (Completed)   EKG 12-Lead (Completed)   Ambulatory referral to Cardiology      No orders of the defined types were placed in this encounter.   Follow-up: No follow-ups on file.   PLAN  EKG today shows marked bradycardia with rate of 46bpm. Continuing to show same prolonged PR interval as previous study, but new study also shows an irregular rhythm suggesting that bradycardia could be symptomatic enough to cause his dizzy spells.   Will collect labs and follow up. Differentials include hypoglycemia and an electrolyte imbalance.  Will refer to cardiology. Given patient's overall health and good clinical appearance at today's visit, he may be a good candidate for a pacemaker to avoid symptomatic bradycardia. Additionally, I think looking at his cardizem dose is worthwhile. He formerly had been  established with cardiology and is on annual follow ups - he is overdue.  Patient encouraged to call clinic with any questions, comments, or concerns.  Janeece Agee, NP

## 2019-09-18 ENCOUNTER — Ambulatory Visit: Payer: Medicare Other | Admitting: Registered Nurse

## 2019-09-18 NOTE — Progress Notes (Deleted)
Cardiology Office Note   Date:  09/18/2019   ID:  Benjamin Mccall, DOB 26-Jan-1930, MRN [id number]  PCP:  Maximiano Coss, NP  Cardiologist:  Dr. Johnsie Cancel     No chief complaint on file.     History of Present Illness: Benjamin Mccall is a 86 y.o. male who presents for Slow HR and dizziness.  history of Sarcoid, HTN, PAF not on anticoagulation due to age. Distant history of CAD with stents no records available but over 15 years ago. LE varicosities with edema Rx with diuretic Lives at 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 Abdominal distention 02/18/2015  . Asthma   . Atrial fibrillation ([place])   . Chest pain   . Controlled type 2 diabetes mellitus ([place]) 03/13/2014  . Diabetes mellitus without complication ([place])   . GERD (gastroesophageal reflux disease)   . Hernia, hiatal   . Hyperlipidemia   . Hypertension   . Hypothyroid   . Hypothyroidism 03/13/2014   Congenital.   . Rash and nonspecific skin eruption 04/03/2014  . Sarcoidosis 03/13/2014   Followed in Pulmonary clinic/ [facility]/ [facility] - dx in 1980's by surgical bx of lymph node in neck and rx x 10 years with prednisone for cough/sob - 03/27/2014  Walked RA x 3 laps @ 185 ft each stopped due to  End of study, no sob or desat @ slow pace - PFT's 06/29/14  VC 65%  DLCO 65 corrects to 99%     Past Surgical History:  Procedure Laterality Date  . APPENDECTOMY    . CATARACT EXTRACTION    . HIATAL HERNIA REPAIR    . PERCUTANEOUS CORONARY STENT INTERVENTION (PCI-S)       Current Outpatient Medications  Medication Sig Dispense Refill  . acetaminophen (TYLENOL) 325 MG tablet Take 1 tablet (325 mg total) by mouth every 6 (six) hours as needed. 30 tablet  0  . albuterol (PROVENTIL HFA;VENTOLIN HFA) 108 (90 BASE) MCG/ACT inhaler Inhale 2 puffs into the lungs every 4 (four) hours as needed for wheezing or shortness of breath.     [name] aspirin 81 MG tablet Take 81 mg by mouth every morning.     [name] atorvastatin (LIPITOR) 10 MG tablet Take 1 tablet (10 mg total) by mouth daily. 30 tablet 0  . Cholecalciferol (VITAMIN D3) 1000 UNITS CAPS Take 1,000 Units by mouth every morning.     . citalopram (CELEXA) 10 MG tablet Take 10 mg by mouth every morning.     . clopidogrel (PLAVIX) 75 MG tablet Take 75 mg by mouth every morning.     [name] CREON 12000 units CPEP capsule Take 36,000 mg by mouth 3 (three) times daily.     [name] diltiazem (CARDIZEM CD) 120 MG 24 hr capsule Take 1 capsule (120 mg total) by mouth every morning. 30 capsule 1  . famotidine (PEPCID) 20 MG tablet Take 1 tablet (20 mg total) by mouth 2 (two) times daily. 60 tablet 2  . fluticasone (FLONASE) 50 MCG/ACT nasal spray Place 2 sprays into both nostrils as needed (nasal stuffiness). 16 g 3  . gabapentin (NEURONTIN) 300 MG capsule Take 300 mg by mouth at  bedtime.    . Glycopyrrolate-Formoterol (BEVESPI AEROSPHERE) 9-4.8 MCG/ACT AERO Inhale 1 puff into the lungs 2 (two) times daily. 1 Inhaler 0  . guaiFENesin (MUCINEX) 600 MG 12 hr tablet Take 600 mg by mouth 2 (two) times daily as needed for cough.     . hydrocortisone cream 1 % Apply 1 application topically daily as needed for itching.    . Iron-Vitamins (THEREMS H PO) Take 1 tablet by mouth daily.    [name] levothyroxine (SYNTHROID, LEVOTHROID) 75 MCG tablet Take 1 tablet (75 mcg total) by mouth every morning. 90 tablet 0  . loratadine (CLARITIN) 10 MG tablet Take 1 tablet (10 mg total) by mouth daily as needed (drainage, drippy nose). 30 tablet 9  . metFORMIN (GLUCOPHAGE) 500 MG tablet Take 500 mg by mouth 2 (two) times daily with a meal.     . Multiple Vitamins-Minerals (THEREMS-M) TABS Take 1 tablet by mouth daily.    . nitroGLYCERIN (NITRODUR - DOSED IN  MG/24 HR) 0.6 mg/hr patch Apply patch every morning and remove at bedtime    . nitroGLYCERIN (NITROSTAT) 0.4 MG SL tablet Place 0.4 mg under the tongue every 5 (five) minutes as needed for chest pain.    [name] omeprazole (PRILOSEC) 20 MG capsule Take 20 mg by mouth daily.     . polyethylene glycol powder (GLYCOLAX/MIRALAX) powder Take 17 g by mouth daily.     . polyvinyl alcohol (ARTIFICIAL TEARS) 1.4 % ophthalmic solution Place 1 drop into both eyes 3 (three) times daily as needed for dry eyes.    . SF 1.1 % GEL dental gel Place 1 drop onto teeth 3 (three) times daily.     . simethicone (GAS-X EXTRA STRENGTH) 125 MG chewable tablet Chew 1-2 tablets (125-250 mg total) by mouth 3 (three) times daily with meals. Max 4 tablets daily (Patient taking differently: Chew 125-250 mg by mouth 4 (four) times daily as needed (gas). Max 4 tablets daily) 30 tablet 0  . sodium chloride (OCEAN) 0.65 % SOLN nasal spray Place 1 spray into both nostrils as needed for congestion.     No current facility-administered medications for this visit.    Allergies:   Reglan [metoclopramide] and Sulfa antibiotics    Social History:  The patient  reports that he quit smoking about 51 years ago. His smoking use included pipe and cigars. He has a 3.50 pack-year smoking history. He has never used smokeless tobacco. He reports that he does not drink alcohol or use drugs.   Family History:  The patient's ***family history includes Diabetes in his son; Hypertension in his mother; Ovarian cancer in his mother; Stroke in his father and mother.    ROS:  General:no colds or fevers, no weight changes Skin:no rashes or ulcers HEENT:no blurred vision, no congestion CV:see HPI PUL:see HPI GI:no diarrhea constipation or melena, no indigestion GU:no hematuria, no dysuria MS:no joint pain, no claudication Neuro:no syncope, no lightheadedness Endo:no diabetes, no thyroid disease Wt Readings from Last 3 Encounters:  09/13/19 157 lb 12.8  oz (71.6 kg)  12/16/18 161 lb (73 kg)  05/10/18 163 lb (73.9 kg)     PHYSICAL EXAM: VS:  There were no vitals taken for this visit. , BMI There is no height or weight on file to calculate BMI. General:Pleasant affect, NAD Skin:Warm and dry, brisk capillary refill HEENT:normocephalic, sclera clear, mucus membranes moist Neck:supple, no JVD, no bruits  Heart:S1S2 RRR without murmur, gallup, rub or click Lungs:clear without rales, rhonchi, or wheezes PNT:IRWE,  non tender, + BS, do not palpate liver spleen or masses Ext:no lower ext edema, 2+ pedal pulses, 2+ radial pulses Neuro:alert and oriented, MAE, follows commands, + facial symmetry    EKG:  EKG is ordered today. The ekg ordered today demonstrates ***   Recent Labs: 09/13/2019: ALT 11; BUN 24; Creatinine, Ser 1.52; Hemoglobin 10.7; Platelets 273; Potassium 4.4; Sodium 140; TSH 1.020    Lipid Panel    Component Value Date/Time   CHOL 170 09/13/2019 1052   TRIG 81 09/13/2019 1052   HDL 63 09/13/2019 1052   CHOLHDL 2.7 09/13/2019 1052   CHOLHDL 3 10/25/2017 1158   VLDL 19.2 10/25/2017 1158   LDLCALC 92 09/13/2019 1052       Other studies Reviewed: Additional studies/ records that were reviewed today include: ***.   ASSESSMENT AND PLAN:  1.  ***   Current medicines are reviewed with the patient today.  The patient Has no concerns regarding medicines.  The following changes have been made:  See above Labs/ tests ordered today include:see above  Disposition:   FU:  see above  Signed, Nada Boozer, NP  09/18/2019 5:06 PM    [facility] [address] [place], [place], [place]  [address]/ [address] [place], [place] Phone: [phone]; Fax: [phone]  [phone]

## 2019-09-19 ENCOUNTER — Ambulatory Visit: Payer: Medicare Other | Admitting: Cardiology

## 2019-10-02 ENCOUNTER — Ambulatory Visit: Payer: Medicare Other | Admitting: Registered Nurse

## 2019-10-02 VITALS — BP 132/84 | Ht 64.0 in | Wt 157.0 lb

## 2019-10-02 DIAGNOSIS — Z0000 Encounter for general adult medical examination without abnormal findings: Secondary | ICD-10-CM

## 2019-10-02 NOTE — Patient Instructions (Signed)
Thank you for taking time to come for your Medicare Wellness Visit. I appreciate your ongoing commitment to your health goals. Please review the following plan we discussed and let me know if I can assist you in the future.  Leroy Kennedy LPN  Preventive Care 64 Years and Older, Male Preventive care refers to lifestyle choices and visits with your health care provider that can promote health and wellness. This includes:  A yearly physical exam. This is also called an annual well check.  Regular dental and eye exams.  Immunizations.  Screening for certain conditions.  Healthy lifestyle choices, such as diet and exercise. What can I expect for my preventive care visit? Physical exam Your health care provider will check:  Height and weight. These may be used to calculate body mass index (BMI), which is a measurement that tells if you are at a healthy weight.  Heart rate and blood pressure.  Your skin for abnormal spots. Counseling Your health care provider may ask you questions about:  Alcohol, tobacco, and drug use.  Emotional well-being.  Home and relationship well-being.  Sexual activity.  Eating habits.  History of falls.  Memory and ability to understand (cognition).  Work and work Statistician. What immunizations do I need?  Influenza (flu) vaccine  This is recommended every year. Tetanus, diphtheria, and pertussis (Tdap) vaccine  You may need a Td booster every 10 years. Varicella (chickenpox) vaccine  You may need this vaccine if you have not already been vaccinated. Zoster (shingles) vaccine  You may need this after age 66. Pneumococcal conjugate (PCV13) vaccine  One dose is recommended after age 84. Pneumococcal polysaccharide (PPSV23) vaccine  One dose is recommended after age 88. Measles, mumps, and rubella (MMR) vaccine  You may need at least one dose of MMR if you were born in 1957 or later. You may also need a second dose. Meningococcal  conjugate (MenACWY) vaccine  You may need this if you have certain conditions. Hepatitis A vaccine  You may need this if you have certain conditions or if you travel or work in places where you may be exposed to hepatitis A. Hepatitis B vaccine  You may need this if you have certain conditions or if you travel or work in places where you may be exposed to hepatitis B. Haemophilus influenzae type b (Hib) vaccine  You may need this if you have certain conditions. You may receive vaccines as individual doses or as more than one vaccine together in one shot (combination vaccines). Talk with your health care provider about the risks and benefits of combination vaccines. What tests do I need? Blood tests  Lipid and cholesterol levels. These may be checked every 5 years, or more frequently depending on your overall health.  Hepatitis C test.  Hepatitis B test. Screening  Lung cancer screening. You may have this screening every year starting at age 24 if you have a 30-pack-year history of smoking and currently smoke or have quit within the past 15 years.  Colorectal cancer screening. All adults should have this screening starting at age 52 and continuing until age 61. Your health care provider may recommend screening at age 57 if you are at increased risk. You will have tests every 1-10 years, depending on your results and the type of screening test.  Prostate cancer screening. Recommendations will vary depending on your family history and other risks.  Diabetes screening. This is done by checking your blood sugar (glucose) after you have not eaten for  a while (fasting). You may have this done every 1-3 years.  Abdominal aortic aneurysm (AAA) screening. You may need this if you are a current or former smoker.  Sexually transmitted disease (STD) testing. Follow these instructions at home: Eating and drinking  Eat a diet that includes fresh fruits and vegetables, whole grains, lean  protein, and low-fat dairy products. Limit your intake of foods with high amounts of sugar, saturated fats, and salt.  Take vitamin and mineral supplements as recommended by your health care provider.  Do not drink alcohol if your health care provider tells you not to drink.  If you drink alcohol: ? Limit how much you have to 0-2 drinks a day. ? Be aware of how much alcohol is in your drink. In the [place] one drink equals one 12 oz bottle of beer (355 mL), one 5 oz glass of wine (148 mL), or one 1 oz glass of hard liquor (44 mL). Lifestyle  Take daily care of your teeth and gums.  Stay active. Exercise for at least 30 minutes on 5 or more days each week.  Do not use any products that contain nicotine or tobacco, such as cigarettes, e-cigarettes, and chewing tobacco. If you need help quitting, ask your health care provider.  If you are sexually active, practice safe sex. Use a condom or other form of protection to prevent STIs (sexually transmitted infections).  Talk with your health care provider about taking a low-dose aspirin or statin. What's next?  Visit your health care provider once a year for a well check visit.  Ask your health care provider how often you should have your eyes and teeth checked.  Stay up to date on all vaccines. This information is not intended to replace advice given to you by your health care provider. Make sure you discuss any questions you have with your health care provider. Document Revised: 04/07/2018 Document Reviewed: 04/07/2018 Elsevier Patient Education  2020 [employer]  

## 2019-10-02 NOTE — Progress Notes (Signed)
Cardiology Office Note   Date:  10/06/2019   ID:  [name], DOB 1929-08-13, MRN [id number]  PCP:  Janeece Agee, NP  Cardiologist:  Dr. Eden Emms, MD   Chief Complaint  Patient presents with  . Dizziness    History of Present Illness: Benjamin Mccall is a 86 y.o. male who presents for the evaluation of dizziness, seen for Dr. Eden Emms.   Benjamin Mccall has a history of sarcoid with no cardiac involvement, HTN, PAF not on anticoagulation secondary to age, remote CAD with PCI placement (no records available), LE varicosities, COPD Gold 2.   He underwent an echocardiogram 04/25/2014 which showed an LVEF of 55 to 60% with mild LVH, LAE mildly dilated and no valvular disease.  He was last seen by Dr. Eden Emms 05/10/2018 in follow-up.  He was maintaining normal sinus rhythm with no palpitations, BP was well controlled.  Evidence of first-degree block with no high-grade AV block. No medication changes were made with plans for 1 year follow-up.  He was then seen by his PCP 08/2019 for symptoms of dizziness and elevated BP>>EKG performed at that time showed HR 46 bpm with no significant arrhythmias however there was concern given his recent complaints of dizziness therefore he was sent back to cardiology as he is overdue for annual exam.  He reported approximately 3 episodes of dizziness in the preceding weeks prior to that appointment with sudden dizziness, lightheadedness for which he needed to sit down.  He had no syncope.  He had no chest pain.  There was concern for polypharmacy.   Today, Benjamin Mccall is here with his son and seems to be doing well from a CV standpoint.  He walks with a walker and reports that he lives at assisted living facility with his wife.  She has Lewy bodies syndrome and he assist with her care.  Most recently, he has been having intermittent dizziness which only occurs approximately every 1 to 2 weeks.  He has had no syncope as above.  Dizziness does not occur with  change of position necessarily however it does occur with abrupt turn of his head therefore I wonder if this is vestibular in etiology.  Reports it typically happens in the evening more than in the morning.  He states he has poor oral intake with plain water.  Reports most drinks her coffee and soda throughout the day.  We discussed adding plain water throughout the day to help with this.  EKG shows SB 1st AV block with HR 51 bpm and PACs.  He is on diltiazem CD 4 history of PAF.  He reports no palpitations however also states he has never known when he has been in atrial fibrillation.  He is not on anticoagulation secondary to advanced age and more recent falls.    Past Medical History:  Diagnosis Date  . Abdominal distention 02/18/2015  . Asthma   . Atrial fibrillation (HCC)   . Chest pain   . Controlled type 2 diabetes mellitus (HCC) 03/13/2014  . Diabetes mellitus without complication (HCC)   . GERD (gastroesophageal reflux disease)   . Hernia, hiatal   . Hyperlipidemia   . Hypertension   . Hypothyroid   . Hypothyroidism 03/13/2014   Congenital.   . Rash and nonspecific skin eruption 04/03/2014  . Sarcoidosis 03/13/2014   Followed in Pulmonary clinic/ [facility]/ [place] - dx in 1980's by surgical bx of lymph node in neck and rx x 10 years with prednisone  for cough/sob - 03/27/2014  Walked RA x 3 laps @ 185 ft each stopped due to  End of study, no sob or desat @ slow pace - PFT's 06/29/14  VC 65%  DLCO 65 corrects to 99%     Past Surgical History:  Procedure Laterality Date  . APPENDECTOMY    . CATARACT EXTRACTION    . HIATAL HERNIA REPAIR    . PERCUTANEOUS CORONARY STENT INTERVENTION (PCI-S)       Current Outpatient Medications  Medication Sig Dispense Refill  . acetaminophen (TYLENOL) 325 MG tablet Take 1 tablet (325 mg total) by mouth every 6 (six) hours as needed. 30 tablet 0  . albuterol (PROVENTIL HFA;VENTOLIN HFA) 108 (90 BASE) MCG/ACT inhaler Inhale 2 puffs into the  lungs every 4 (four) hours as needed for wheezing or shortness of breath.     [name] amoxicillin (AMOXIL) 500 MG capsule Take 2,000 mg by mouth as directed. Take 4 capsules by mouth 1 hour before dental appointment    . aspirin 81 MG tablet Take 81 mg by mouth every morning.     [name] atorvastatin (LIPITOR) 10 MG tablet Take 1 tablet (10 mg total) by mouth daily. 30 tablet 0  . Cholecalciferol (VITAMIN D3) 1000 UNITS CAPS Take 1,000 Units by mouth every morning.     . citalopram (CELEXA) 10 MG tablet Take 10 mg by mouth every morning.     . clopidogrel (PLAVIX) 75 MG tablet Take 75 mg by mouth every morning.     [name] CREON 12000 units CPEP capsule Take 36,000 mg by mouth 3 (three) times daily.     . famotidine (PEPCID) 20 MG tablet Take 1 tablet (20 mg total) by mouth 2 (two) times daily. 60 tablet 2  . gabapentin (NEURONTIN) 300 MG capsule Take 300 mg by mouth at bedtime.    . Glycopyrrolate-Formoterol (BEVESPI AEROSPHERE) 9-4.8 MCG/ACT AERO Inhale 1 puff into the lungs 2 (two) times daily. 1 Inhaler 0  . guaiFENesin (GERI-TUSSIN) 100 MG/5ML liquid Take 10 mLs by mouth 3 (three) times daily as needed for cough.    [name] guaiFENesin (MUCINEX) 600 MG 12 hr tablet Take 600 mg by mouth 2 (two) times daily as needed for cough.     . hydrocortisone cream 1 % Apply 1 application topically daily as needed for itching.    . Iron-Vitamins (THEREMS H PO) Take 1 tablet by mouth daily.    [name] levothyroxine (SYNTHROID, LEVOTHROID) 75 MCG tablet Take 1 tablet (75 mcg total) by mouth every morning. 90 tablet 0  . metFORMIN (GLUCOPHAGE) 500 MG tablet Take 500 mg by mouth daily.     . Multiple Vitamins-Minerals (THEREMS-M) TABS Take 1 tablet by mouth daily.    . nitroGLYCERIN (NITRODUR - DOSED IN MG/24 HR) 0.6 mg/hr patch Apply patch every morning and remove at bedtime    . nitroGLYCERIN (NITROSTAT) 0.4 MG SL tablet Place 0.4 mg under the tongue every 5 (five) minutes as needed for chest pain.    [name] omeprazole (PRILOSEC) 20 MG  capsule Take 20 mg by mouth daily.     . polyethylene glycol powder (GLYCOLAX/MIRALAX) powder Take 17 g by mouth daily.     . polyvinyl alcohol (ARTIFICIAL TEARS) 1.4 % ophthalmic solution Place 1 drop into both eyes 3 (three) times daily as needed for dry eyes.    . simethicone (GAS-X EXTRA STRENGTH) 125 MG chewable tablet Chew 1-2 tablets (125-250 mg total) by mouth 3 (three) times daily with meals. Max 4  tablets daily 30 tablet 0  . sodium chloride (OCEAN) 0.65 % SOLN nasal spray Place 1 spray into both nostrils as needed for congestion.    . sodium fluoride (DENTAGEL) 1.1 % GEL dental gel Place 1 application onto teeth 3 (three) times daily.    [name] diltiazem (CARDIZEM) 60 MG tablet Take 1 tablet (60 mg total) by mouth daily. 90 tablet 1   No current facility-administered medications for this visit.    Allergies:   Reglan [metoclopramide] and Sulfa antibiotics    Social History:  The patient  reports that he quit smoking about 51 years ago. His smoking use included pipe and cigars. He has a 3.50 pack-year smoking history. He has never used smokeless tobacco. He reports that he does not drink alcohol and does not use drugs.   Family History:  The patient's family history includes Diabetes in his son; Hypertension in his mother; Ovarian cancer in his mother; Stroke in his father and mother.    ROS:  Please see the history of present illness.   Otherwise, review of systems are positive for none.   All other systems are reviewed and negative.    PHYSICAL EXAM: VS:  BP 140/64   Pulse (!) 51   Ht 5\' 4"  (1.626 m)   Wt 160 lb 1.9 oz (72.6 kg)   SpO2 96%   BMI 27.48 kg/m  , BMI Body mass index is 27.48 kg/m.   General: Elderly, NAD Neck: Negative for carotid bruits. No JVD Lungs:Clear to ausculation bilaterally. No wheezes, rales, or rhonchi. Breathing is unlabored. Cardiovascular: RRR with S1 S2. No murmurs Extremities: No edema.  Radial pulses 2+ bilaterally Neuro: Alert and oriented.  No focal deficits. No facial asymmetry. MAE spontaneously. Psych: Responds to questions appropriately with normal affect.    EKG:  EKG is ordered today. The ekg ordered today demonstrates sinus bradycardia, first-degree AV block, HR 51 bpm with PACs   Recent Labs: 09/13/2019: ALT 11; BUN 24; Creatinine, Ser 1.52; Hemoglobin 10.7; Platelets 273; Potassium 4.4; Sodium 140; TSH 1.020    Lipid Panel    Component Value Date/Time   CHOL 170 09/13/2019 1052   TRIG 81 09/13/2019 1052   HDL 63 09/13/2019 1052   CHOLHDL 2.7 09/13/2019 1052   CHOLHDL 3 10/25/2017 1158   VLDL 19.2 10/25/2017 1158   LDLCALC 92 09/13/2019 1052      Wt Readings from Last 3 Encounters:  10/06/19 160 lb 1.9 oz (72.6 kg)  10/02/19 157 lb (71.2 kg)  09/13/19 157 lb 12.8 oz (71.6 kg)     Other studies Reviewed: Additional studies/ records that were reviewed today include:  Review of the above records demonstrates:   Echocardiogram 04/25/2014:  Study Conclusions   - Left ventricle: Wall thickness was increased in a pattern of mild  LVH. There was mild focal basal hypertrophy of the septum.  Systolic function was normal. The estimated ejection fraction was  in the range of 55% to 60%. Wall motion was normal; there were no  regional wall motion abnormalities. Doppler parameters are  consistent with abnormal left ventricular relaxation (grade 1  diastolic dysfunction).  - Left atrium: The atrium was mildly dilated.     ASSESSMENT AND PLAN:  1. Dizziness/bradycardia: -Reports intermittent dizziness which occurs approximately once every 2 weeks.  Dizziness not necessarily with change of position however does often occur with abrupt turn of his head therefore questionable vestibular etiology.  Reports he has had issues with vertigo in the past however  this is been many years ago. -Also reports poor plain water intake throughout the day.  Most fluid intake is with coffee/soda therefore encouraged  to increase his fluids to 6 to 8 glasses of plain water per day. -EKG with SB 1st degree AV block, HR 51bpm and PAC -There is concern of recurrent PAF and bradycardia causing his symptoms therefore will place a 30-day monitor for full cardiac assessment and will also decrease diltiazem to 60 mg p.o. daily to allow for increased HR -Lab work per PCP stable>> electrolytes, TSH, hemoglobin within normal limits  2.  History of CAD: -Denies chest pain symptoms Continue with current regimen-  3.  History of PAF: -Maintaining sinus bradycardia per EKG today -Denies palpitations -On diltiazem CD 120 mg p.o. daily however given bradycardia on PCP evaluation as well as EKG today we will reduce this to 60 mg p.o. daily -Place 30-day monitor   4.  Hypertension: -Moderate daily at 140/64 -Once dizziness/bradycardia etiology more clearly defined may need further titration of antihypertensives -Close follow-up  5.  Pulmonary sarcoid: -No cardiac involvement -Stable per patient/family   Current medicines are reviewed at length with the patient today.  The patient does not have concerns regarding medicines.  The following changes have been made: Decrease diltiazem to 60 mg p.o. daily  Labs/ tests ordered today include: Placed 30-day monitor for full bradycardia, PAF evaluation  Orders Placed This Encounter  Procedures  . CARDIAC EVENT MONITOR  . EKG 12-Lead    Disposition:   FU with Dr. Eden Emms  in 3 months  Signed, Benjamin Chard, NP  10/06/2019 10:48 AM    [facility] [address] [place], [place], [place]  [address] Phone: [phone]; Fax: [phone]

## 2019-10-02 NOTE — Progress Notes (Signed)
Presents today for [employer] Visit   Date of last exam: 09-13-2019  Interpreter used for this visit? No  I connected with  Benjamin Mccall on 10/02/19 by a telephone  and verified that I am speaking with the correct person using two identifiers.   I discussed the limitations of evaluation and management by telemedicine. The patient expressed understanding and agreed to proceed.    Patient Care Team: Maximiano Coss, NP as PCP - General (Adult Health Nurse Practitioner)   Other items to address today:   Discussed immunizations Discussed Eye/Dental    Other Screening: Last screening for diabetes: 09/13/2019 Last lipid screening: 09/13/2019  ADVANCE DIRECTIVES: Discussed: yes Materials Provided no  Immunization status:  Immunization History  Administered Date(s) Administered  . Influenza Split 01/25/2014  . Influenza, High Dose Seasonal PF 01/23/2015, 01/23/2016, 01/26/2017, 01/10/2018  . Pneumococcal Conjugate-13 11/25/2013  . Tdap 01/23/2015  . Zoster Recombinat (Shingrix) 03/23/2017, 05/24/2017     Health Maintenance Due  Topic Date Due  . COVID-19 Vaccine (1) Never done     Functional Status Survey: Is the patient deaf or have difficulty hearing?: No Does the patient have difficulty seeing, even when wearing glasses/contacts?: No Does the patient have difficulty concentrating, remembering, or making decisions?: No Does the patient have difficulty walking or climbing stairs?: Yes(uses a walker) Does the patient have difficulty dressing or bathing?: No Does the patient have difficulty doing errands alone such as visiting a doctor's office or shopping?: Yes(assisted living family helps)   [place] 10/02/2019  What Year? 0 points  What month? 0 points  What time? 0 points  Count back from 20 0 points  Months in reverse 4 points  Repeat phrase 0 points  Total Score 4        Clinical Support from 10/02/2019 in [facility] [place]    AUDIT-C Score  0       Home Environment:   Lives in one story home Patient is taking care of sick wife  She Lives with him in [facility] assisted living.  Using walker more instead of cane. Yes trouble climbing stairs  No scattered rugs Yes Grab bars Adequate lighting/ no clutter   Patient Active Problem List   Diagnosis Date Noted  . Closed nondisplaced fracture of surgical neck of left humerus with routine healing 02/09/2018  . Personal history of fracture 02/09/2018  . Osteoporosis 02/09/2018  . Sinus bradycardia 10/25/2017  . Polypharmacy 09/04/2017  . Blister of left hand 08/26/2017  . PAF (paroxysmal atrial fibrillation) ([place]) 08/11/2017  . Syncope 08/10/2017  . GERD (gastroesophageal reflux disease)   . Diabetes mellitus without complication ([place])   . Neck pain 03/10/2017  . Allergic rhinitis 02/18/2017  . Acute right-sided thoracic back pain 08/17/2016  . Medicare annual wellness visit, subsequent 03/24/2016  . Skin lesion of neck 03/24/2016  . Bloating 07/25/2015  . Occasional tremors 07/25/2015  . Abdominal distention 02/18/2015  . Upper airway cough syndrome 12/25/2014  . Acute upper respiratory infection 07/26/2014  . COPD GOLD II  07/01/2014  . Essential hypertension 04/03/2014  . Rash and nonspecific skin eruption 04/03/2014  . Sarcoidosis 03/13/2014  . Hyperlipidemia 03/13/2014  . Controlled type 2 diabetes mellitus ([place]) 03/13/2014  . Coronary artery disease 03/13/2014  . Atrial fibrillation ([place]) 03/13/2014  . Hypothyroidism 03/13/2014     Past Medical History:  Diagnosis Date  . Abdominal distention 02/18/2015  . Asthma   . Atrial fibrillation ([place])   .  Chest pain   . Controlled type 2 diabetes mellitus (HCC) 03/13/2014  . Diabetes mellitus without complication (HCC)   . GERD (gastroesophageal reflux disease)   . Hernia, hiatal   . Hyperlipidemia   . Hypertension   . Hypothyroid   . Hypothyroidism 03/13/2014   Congenital.   . Rash  and nonspecific skin eruption 04/03/2014  . Sarcoidosis 03/13/2014   Followed in Pulmonary clinic/ [facility]/ [facility] - dx in 1980's by surgical bx of lymph node in neck and rx x 10 years with prednisone for cough/sob - 03/27/2014  Walked RA x 3 laps @ 185 ft each stopped due to  End of study, no sob or desat @ slow pace - PFT's 06/29/14  VC 65%  DLCO 65 corrects to 99%      Past Surgical History:  Procedure Laterality Date  . APPENDECTOMY    . CATARACT EXTRACTION    . HIATAL HERNIA REPAIR    . PERCUTANEOUS CORONARY STENT INTERVENTION (PCI-S)       Family History  Problem Relation Age of Onset  . Hypertension Mother   . Stroke Mother   . Ovarian cancer Mother   . Stroke Father   . Diabetes Son      Social History   Socioeconomic History  . Marital status: Married    Spouse name: Not on file  . Number of children: 2  . Years of education: 24  . Highest education level: Not on file  Occupational History  . 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 Smoking status: Former Smoker    Packs/day: 0.50    Years: 7.00    Pack years: 3.50    Types: Pipe, Cigars    Quit date: 04/27/1968    Years since quitting: 51.4  . Smokeless tobacco: Never Used  Substance and Sexual Activity  . Alcohol use: No    Alcohol/week: 0.0 standard drinks  . Drug use: No  . Sexual activity: Not on file  Other Topics Concern  . Not on file  Social History Narrative   Born and raised in [place]. Residing in [facility] with his wife. Denies any beliefs effecting healthcare.    Social Determinants of Health   Financial Resource Strain:   . Difficulty of Paying Living Expenses:   Food Insecurity:   . Worried About Programme researcher, broadcasting/film/video in the Last Year:   . Barista in the Last Year:   Transportation Needs:   . Freight forwarder (Medical):   [name] Lack of Transportation (Non-Medical):   Physical Activity:   . Days of  Exercise per Week:   . Minutes of Exercise per Session:   Stress:   . Feeling of Stress :   Social Connections:   . Frequency of Communication with Friends and Family:   . Frequency of Social Gatherings with Friends and Family:   . Attends Religious Services:   . Active Member of Clubs or Organizations:   . Attends Banker Meetings:   [name] Marital Status:   Intimate Partner Violence:   . Fear of Current or Ex-Partner:   . Emotionally Abused:   [name] Physically Abused:   . Sexually Abused:      Allergies  Allergen Reactions  . Reglan [Metoclopramide]     Tremors   . Sulfa Antibiotics Other (See Comments)    shakes     Prior to Admission  medications   Medication Sig Start Date End Date Taking? Authorizing Provider  nitroGLYCERIN (NITROSTAT) 0.4 MG SL tablet Place 0.4 mg under the tongue every 5 (five) minutes as needed for chest pain.   Yes [provider]  simethicone (GAS-X EXTRA STRENGTH) 125 MG chewable tablet Chew 1-2 tablets (125-250 mg total) by mouth 3 (three) times daily with meals. Max 4 tablets daily Patient taking differently: Chew 125-250 mg by mouth 4 (four) times daily as needed (gas). Max 4 tablets daily 02/18/15  Yes Veryl Speak, FNP  acetaminophen (TYLENOL) 325 MG tablet Take 1 tablet (325 mg total) by mouth every 6 (six) hours as needed. 01/06/18   Fawze, Mina A, PA-C  albuterol (PROVENTIL HFA;VENTOLIN HFA) 108 (90 BASE) MCG/ACT inhaler Inhale 2 puffs into the lungs every 4 (four) hours as needed for wheezing or shortness of breath.     [provider]  aspirin 81 MG tablet Take 81 mg by mouth every morning.     [provider]  atorvastatin (LIPITOR) 10 MG tablet Take 1 tablet (10 mg total) by mouth daily. 09/02/17   Evaristo Bury, NP  Cholecalciferol (VITAMIN D3) 1000 UNITS CAPS Take 1,000 Units by mouth every morning.     [provider]  citalopram (CELEXA) 10 MG tablet Take 10 mg by mouth every morning.      [provider]  clopidogrel (PLAVIX) 75 MG tablet Take 75 mg by mouth every morning.     [provider]  CREON 12000 units CPEP capsule Take 36,000 mg by mouth 3 (three) times daily.  11/19/15   [provider]  diltiazem (CARDIZEM CD) 120 MG 24 hr capsule Take 1 capsule (120 mg total) by mouth every morning. 08/12/17   Hollice Espy, MD  famotidine (PEPCID) 20 MG tablet Take 1 tablet (20 mg total) by mouth 2 (two) times daily. 07/01/18   Evaristo Bury, NP  fluticasone (FLONASE) 50 MCG/ACT nasal spray Place 2 sprays into both nostrils as needed (nasal stuffiness). 02/18/17   Evaristo Bury, NP  gabapentin (NEURONTIN) 300 MG capsule Take 300 mg by mouth at bedtime.    [provider]  Glycopyrrolate-Formoterol (BEVESPI AEROSPHERE) 9-4.8 MCG/ACT AERO Inhale 1 puff into the lungs 2 (two) times daily. 02/23/17   Nyoka Cowden, MD  guaiFENesin (MUCINEX) 600 MG 12 hr tablet Take 600 mg by mouth 2 (two) times daily as needed for cough.     [provider]  hydrocortisone cream 1 % Apply 1 application topically daily as needed for itching.    [provider]  Iron-Vitamins (THEREMS H PO) Take 1 tablet by mouth daily.    [provider]  levothyroxine (SYNTHROID, LEVOTHROID) 75 MCG tablet Take 1 tablet (75 mcg total) by mouth every morning. 10/27/17   Olive Bass, FNP  loratadine (CLARITIN) 10 MG tablet Take 1 tablet (10 mg total) by mouth daily as needed (drainage, drippy nose). 02/18/17   Evaristo Bury, NP  metFORMIN (GLUCOPHAGE) 500 MG tablet Take 500 mg by mouth 2 (two) times daily with a meal.     [provider]  Multiple Vitamins-Minerals (THEREMS-M) TABS Take 1 tablet by mouth daily.    [provider]  nitroGLYCERIN (NITRODUR - DOSED IN MG/24 HR) 0.6 mg/hr patch Apply patch every morning and remove at bedtime    [provider]  omeprazole (PRILOSEC) 20 MG capsule Take 20 mg  by mouth daily.  04/07/17   [provider]  polyethylene glycol powder (GLYCOLAX/MIRALAX) powder Take 17 g by mouth daily.     [provider]  polyvinyl alcohol (ARTIFICIAL TEARS) 1.4 % ophthalmic solution Place 1 drop into both eyes 3 (three) times daily as needed for dry eyes.    [provider]  SF 1.1 % GEL dental gel Place 1 drop onto teeth 3 (three) times daily.  12/10/16   [provider]  sodium chloride (OCEAN) 0.65 % SOLN nasal spray Place 1 spray into both nostrils as needed for congestion.    [provider]     Depression screen [facility] 2/9 10/02/2019 09/13/2019 12/16/2018 03/10/2017 03/24/2016  Decreased Interest 0 0 0 0 0  Down, Depressed, Hopeless 0 0 0 1 0  PHQ - 2 Score 0 0 0 1 0  Altered sleeping - - - 0 -  Tired, decreased energy - - - 1 -  Change in appetite - - - 0 -  Feeling bad or failure about yourself  - - - 0 -  Trouble concentrating - - - 0 -  Moving slowly or fidgety/restless - - - 0 -  Suicidal thoughts - - - 0 -  PHQ-9 Score - - - 2 -  Difficult doing work/chores - - - Not difficult at all -     Fall Risk  10/02/2019 09/13/2019 12/16/2018 03/10/2017 03/24/2016  Falls in the past year? 0 0 1 No No  Number falls in past yr: 1 0 1 - -  Injury with Fall? 1 0 0 - -  Comment was walking with cane.  no injury - - - -  Risk for fall due to : - - - Impaired balance/gait;Impaired mobility -  Follow up Falls evaluation completed;Education provided Falls evaluation completed - - -      PHYSICAL EXAM: BP 132/84 Comment: per patient  Ht 5\' 4"  (1.626 m)   Wt 157 lb (71.2 kg)   BMI 26.95 kg/m    Wt Readings from Last 3 Encounters:  10/02/19 157 lb (71.2 kg)  09/13/19 157 lb 12.8 oz (71.6 kg)  12/16/18 161 lb (73 kg)      Education/Counseling provided regarding diet and exercise, prevention of chronic diseases, smoking/tobacco cessation, if applicable, and reviewed "Covered Medicare Preventive Services."

## 2019-10-06 ENCOUNTER — Other Ambulatory Visit: Payer: Self-pay

## 2019-10-06 ENCOUNTER — Telehealth: Payer: Self-pay | Admitting: Registered Nurse

## 2019-10-06 ENCOUNTER — Telehealth: Payer: Self-pay | Admitting: Radiology

## 2019-10-06 ENCOUNTER — Encounter: Payer: Self-pay | Admitting: Cardiology

## 2019-10-06 ENCOUNTER — Ambulatory Visit (INDEPENDENT_AMBULATORY_CARE_PROVIDER_SITE_OTHER): Payer: Medicare Other | Admitting: Cardiology

## 2019-10-06 VITALS — BP 140/64 | HR 51 | Ht 64.0 in | Wt 160.1 lb

## 2019-10-06 DIAGNOSIS — I2510 Atherosclerotic heart disease of native coronary artery without angina pectoris: Secondary | ICD-10-CM

## 2019-10-06 DIAGNOSIS — I480 Paroxysmal atrial fibrillation: Secondary | ICD-10-CM

## 2019-10-06 DIAGNOSIS — I10 Essential (primary) hypertension: Secondary | ICD-10-CM

## 2019-10-06 DIAGNOSIS — R001 Bradycardia, unspecified: Secondary | ICD-10-CM | POA: Diagnosis not present

## 2019-10-06 DIAGNOSIS — I2583 Coronary atherosclerosis due to lipid rich plaque: Secondary | ICD-10-CM

## 2019-10-06 DIAGNOSIS — I440 Atrioventricular block, first degree: Secondary | ICD-10-CM

## 2019-10-06 DIAGNOSIS — E785 Hyperlipidemia, unspecified: Secondary | ICD-10-CM

## 2019-10-06 MED ORDER — DILTIAZEM HCL 60 MG PO TABS: 60.0000 mg | ORAL_TABLET | Freq: Every day | ORAL | 1 refills | Status: DC

## 2019-10-06 NOTE — Patient Instructions (Addendum)
Medication Instructions:   Your physician has recommended you make the following change in your medication:   1) Decrease Diltiazem to 60 mg, 1 tablet by mouth once a day  *If you need a refill on your cardiac medications before your next appointment, please call your pharmacy*  Lab Work:  None ordered today  Testing/Procedures:  Your physician has recommended that you wear an event monitor for 30 days. Event monitors are medical devices that record the heart's electrical activity. Doctors most often [place] these monitors to diagnose arrhythmias. Arrhythmias are problems with the speed or rhythm of the heartbeat. The monitor is a small, portable device. You can wear one while you do your normal daily activities. This is usually used to diagnose what is causing palpitations/syncope (passing out).  Follow-Up: At [facility], you and your health needs are our priority.  As part of our continuing mission to provide you with exceptional heart care, we have created designated Provider Care Teams.  These Care Teams include your primary Cardiologist (physician) and Advanced Practice Providers (APPs -  Physician Assistants and Nurse Practitioners) who all work together to provide you with the care you need, when you need it.  On 01/23/20 at 10:00AM with Charlton Haws, MD

## 2019-10-06 NOTE — Telephone Encounter (Signed)
Enrolled patient for a 30 day [employer] Event monitor to be mailed to patients home.  

## 2019-10-06 NOTE — Telephone Encounter (Signed)
Pr had called left a message for [place]  via our after hours to call him to reschedule appt. But,no appt to reschedule . LVM for him to call us back for clarification

## 2019-12-28 ENCOUNTER — Telehealth: Payer: Self-pay | Admitting: Registered Nurse

## 2019-12-28 NOTE — Telephone Encounter (Signed)
Paper work has been moved to Providers in-box in provider lounge awaiting the provider.

## 2019-12-28 NOTE — Telephone Encounter (Signed)
Received a fax of FL2 Forms for provider to sign and send off. Paperwork is left in providers box beside nurses station. Please advise.

## 2019-12-29 ENCOUNTER — Encounter: Payer: Self-pay | Admitting: Registered Nurse

## 2020-01-09 ENCOUNTER — Telehealth: Payer: Self-pay

## 2020-01-09 NOTE — Telephone Encounter (Signed)
Left message for patient to call back. Wanted to see if he ever received his monitor.

## 2020-01-09 NOTE — Progress Notes (Deleted)
Patient ID: Benjamin Mccall, male   DOB: [birth year], 86 y.o.   MRN: [id number]   86 y.o. history of pulmonary sarcoid, HTN, PAF not on anticoagulation due to age. Distant history of CAD with stents no records available but over 15 years ago. 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 vestibular Symptoms occurred once every 2 weeks Cardizem dose decreased Event monitor ordered but never done BP was upper normal and not postural   ***  ROS: Denies fever, malais, weight loss, blurry vision, decreased visual acuity, cough, sputum, SOB, hemoptysis, pleuritic pain, palpitaitons, heartburn, abdominal pain, melena, lower extremity edema, claudication, or rash.  All other systems reviewed and negative   General: Affect appropriate Frail elderly male  HEENT: normal Neck supple with no adenopathy JVP normal no bruits no thyromegaly Lungs clear with no wheezing and good diaphragmatic motion Heart:  S1/S2 benign SEM murmur, no rub, gallop or click PMI normal Abdomen: benighn, BS positve, no tenderness, no AAA no bruit.  No HSM or HJR Distal pulses intact with no bruits Neuro non-focal Skin warm and dry LE varicosities with erythema   Medications Current Outpatient Medications  Medication Sig Dispense Refill  . acetaminophen (TYLENOL) 325 MG tablet Take 1 tablet (325 mg total) by mouth every 6 (six) hours as needed. 30 tablet 0  . albuterol (PROVENTIL HFA;VENTOLIN HFA) 108 (90 BASE) MCG/ACT inhaler Inhale 2 puffs into the lungs every 4 (four) hours as needed for wheezing or shortness of breath.     [name] amoxicillin (AMOXIL) 500 MG capsule Take 2,000  mg by mouth as directed. Take 4 capsules by mouth 1 hour before dental appointment    . aspirin 81 MG tablet Take 81 mg by mouth every morning.     [name] atorvastatin (LIPITOR) 10 MG tablet Take 1 tablet (10 mg total) by mouth daily. 30 tablet 0  . Cholecalciferol (VITAMIN D3) 1000 UNITS CAPS Take 1,000 Units by mouth every morning.     . citalopram (CELEXA) 10 MG tablet Take 10 mg by mouth every morning.     . clopidogrel (PLAVIX) 75 MG tablet Take 75 mg by mouth every morning.     [name] CREON 12000 units CPEP capsule Take 36,000 mg by mouth 3 (three) times daily.     [name] diltiazem (CARDIZEM) 60 MG tablet Take 1 tablet (60 mg total) by mouth daily. 90 tablet 1  . famotidine (PEPCID) 20 MG tablet Take 1 tablet (20 mg total) by mouth 2 (two) times daily. 60 tablet 2  . gabapentin (NEURONTIN) 300 MG capsule Take 300 mg by mouth at bedtime.    . Glycopyrrolate-Formoterol (BEVESPI AEROSPHERE) 9-4.8 MCG/ACT AERO Inhale 1 puff into the lungs 2 (two) times daily. 1 Inhaler 0  . guaiFENesin (GERI-TUSSIN) 100 MG/5ML liquid Take 10 mLs by mouth 3 (three) times daily as needed for cough.    [name] guaiFENesin (MUCINEX) 600 MG 12 hr tablet Take 600 mg by mouth 2 (two) times daily as needed for cough.     . hydrocortisone cream 1 % Apply 1 application topically daily as needed for  itching.    . Iron-Vitamins (THEREMS H PO) Take 1 tablet by mouth daily.    [name] levothyroxine (SYNTHROID, LEVOTHROID) 75 MCG tablet Take 1 tablet (75 mcg total) by mouth every morning. 90 tablet 0  . metFORMIN (GLUCOPHAGE) 500 MG tablet Take 500 mg by mouth daily.     . Multiple Vitamins-Minerals (THEREMS-M) TABS Take 1 tablet by mouth daily.    . nitroGLYCERIN (NITRODUR - DOSED IN MG/24 HR) 0.6 mg/hr patch Apply patch every morning and remove at bedtime    . nitroGLYCERIN (NITROSTAT) 0.4 MG SL tablet Place 0.4 mg under the tongue every 5 (five) minutes as needed for chest pain.    [name] omeprazole (PRILOSEC) 20 MG capsule Take 20 mg by mouth daily.       . polyethylene glycol powder (GLYCOLAX/MIRALAX) powder Take 17 g by mouth daily.     . polyvinyl alcohol (ARTIFICIAL TEARS) 1.4 % ophthalmic solution Place 1 drop into both eyes 3 (three) times daily as needed for dry eyes.    . simethicone (GAS-X EXTRA STRENGTH) 125 MG chewable tablet Chew 1-2 tablets (125-250 mg total) by mouth 3 (three) times daily with meals. Max 4 tablets daily 30 tablet 0  . sodium chloride (OCEAN) 0.65 % SOLN nasal spray Place 1 spray into both nostrils as needed for congestion.    . sodium fluoride (DENTAGEL) 1.1 % GEL dental gel Place 1 application onto teeth 3 (three) times daily.     No current facility-administered medications for this visit.    Allergies Reglan [metoclopramide] and Sulfa antibiotics  Family History: Family History  Problem Relation Age of Onset  . Hypertension Mother   . Stroke Mother   . Ovarian cancer Mother   . Stroke Father   . Diabetes Son     Social History: Social History   Socioeconomic History  . Marital status: Married    Spouse name: Not on file  . Number of children: 2  . Years of education: 15  . Highest education level: Not on file  Occupational History  . 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 Smoking status: Former Smoker    Packs/day: 0.50    Years: 7.00    Pack years: 3.50    Types: Pipe, Cigars    Quit date: 04/27/1968    Years since quitting: 51.7  . Smokeless tobacco: Never Used  Vaping Use  . Vaping Use: Never used  Substance and Sexual Activity  . Alcohol use: No    Alcohol/week: 0.0 standard drinks  . Drug use: No  . Sexual activity: Not on file  Other Topics Concern  . Not on file  Social History Narrative   Born and raised in [place]. Residing in [facility] with his wife. Denies any beliefs effecting healthcare.    Social Determinants of Health   Financial Resource Strain:   . Difficulty of Paying Living Expenses: Not  on file  Food Insecurity:   . Worried About Programme researcher, broadcasting/film/video in the Last Year: Not on file  . Ran Out of Food in the Last Year: Not on file  Transportation Needs:   . Lack of Transportation (Medical): Not on file  . Lack of Transportation (Non-Medical): Not on file  Physical Activity:   . Days of Exercise per Week: Not on file  . Minutes of Exercise per Session: Not on file  Stress:   . Feeling of Stress :  Not on file  Social Connections:   . Frequency of Communication with Friends and Family: Not on file  . Frequency of Social Gatherings with Friends and Family: Not on file  . Attends Religious Services: Not on file  . Active Member of Clubs or Organizations: Not on file  . Attends Banker Meetings: Not on file  . Marital Status: Not on file  Intimate Partner Violence:   . Fear of Current or Ex-Partner: Not on file  . Emotionally Abused: Not on file  . Physically Abused: Not on file  . Sexually Abused: Not on file    Past Surgical History:  Procedure Laterality Date  . APPENDECTOMY    . CATARACT EXTRACTION    . HIATAL HERNIA REPAIR    . PERCUTANEOUS CORONARY STENT INTERVENTION (PCI-S)      Past Medical History:  Diagnosis Date  . Abdominal distention 02/18/2015  . Asthma   . Atrial fibrillation (HCC)   . Chest pain   . Controlled type 2 diabetes mellitus (HCC) 03/13/2014  . Diabetes mellitus without complication (HCC)   . GERD (gastroesophageal reflux disease)   . Hernia, hiatal   . Hyperlipidemia   . Hypertension   . Hypothyroid   . Hypothyroidism 03/13/2014   Congenital.   . Rash and nonspecific skin eruption 04/03/2014  . Sarcoidosis 03/13/2014   Followed in Pulmonary clinic/ [facility]/ [facility] - dx in 1980's by surgical bx of lymph node in neck and rx x 10 years with prednisone for cough/sob - 03/27/2014  Walked RA x 3 laps @ 185 ft each stopped due to  End of study, no sob or desat @ slow pace - PFT's 06/29/14  VC 65%  DLCO 65 corrects to 99%      Electrocardiogram:  09/07/14   SR rate 51  LAD PR 230  LVH  12/04/15 SR rate 57 PR 220 LAFB LVH 04/14/17 SB rate 52 PR 232 PVC LVH   Assessment and Plan  CAD" Stable with no angina and good activity level.  Continue medical Rx  PAF: maint NSR with no palpitations   HTN:  Well controlled.  Continue current medications and low sodium Dash type diet.    Sarcoid:  Stable no cardiac involvement f/u [facility]   Dyspnea  Related to asthma / sarcoid EF normal no edema continue inhalers f/u [facility]   First Degree Block :  F/u ECG in a year no high grade AV block  Umbilical Hernia: stable small non tender and reducable  Veins: varicose encouraged compression stockings no incidence of phlebitis  DM:  Discussed low carb diet.  Target hemoglobin A1c is 6.5 or less.  Continue current medications. A1c 6.4 07/25/15    Charlton Haws

## 2020-01-16 NOTE — Telephone Encounter (Signed)
Received notification from [employer] that Pt has requested a cancellation of monitoring service.  Order cancelled. 

## 2020-01-23 ENCOUNTER — Ambulatory Visit: Payer: Medicare Other | Admitting: Cardiovascular Disease

## 2020-01-29 NOTE — Telephone Encounter (Signed)
Patient did not call back and he was a no show for his appointment.

## 2020-03-26 ENCOUNTER — Telehealth: Payer: Self-pay

## 2020-03-26 ENCOUNTER — Telehealth: Payer: Self-pay | Admitting: Registered Nurse

## 2020-03-26 NOTE — Telephone Encounter (Signed)
Spoke with Benjamin Mccall at [facility] at [place]. Needed pcp to approved orders for pt skin tear. Order signed by pcp and faxed

## 2020-03-26 NOTE — Telephone Encounter (Signed)
We received call from [facility] at [facility]. Fax sent but frequency not included. Will refax now.

## 2020-03-26 NOTE — Telephone Encounter (Signed)
Pt forms faxed bk to [facility]. Signature needed quarterly from pcp while pt is in the facility

## 2020-06-21 IMAGING — DX DG SHOULDER 2+V*L*
3 series · 3 of 3 positions shown · non-contrast
Comparison: None.

CLINICAL DATA: Fall.

EXAM:
LEFT SHOULDER - 2+ VIEW

[x shoulder axillary left]
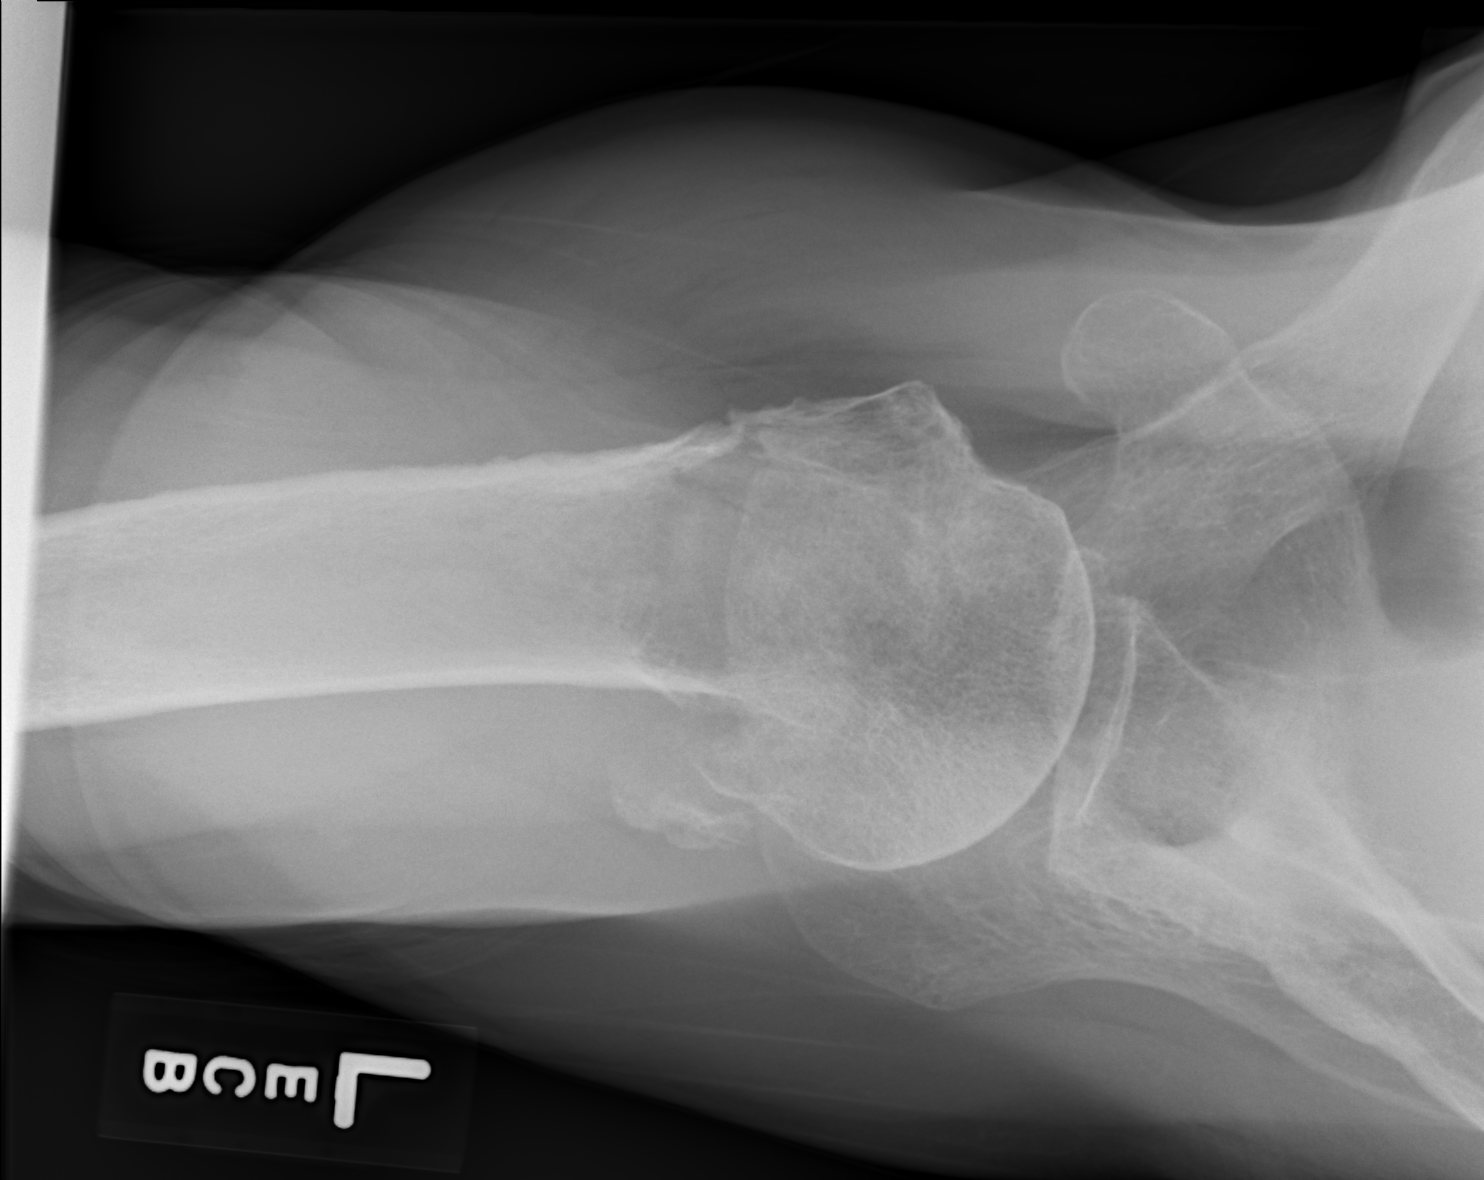

[w shoulder internal left]
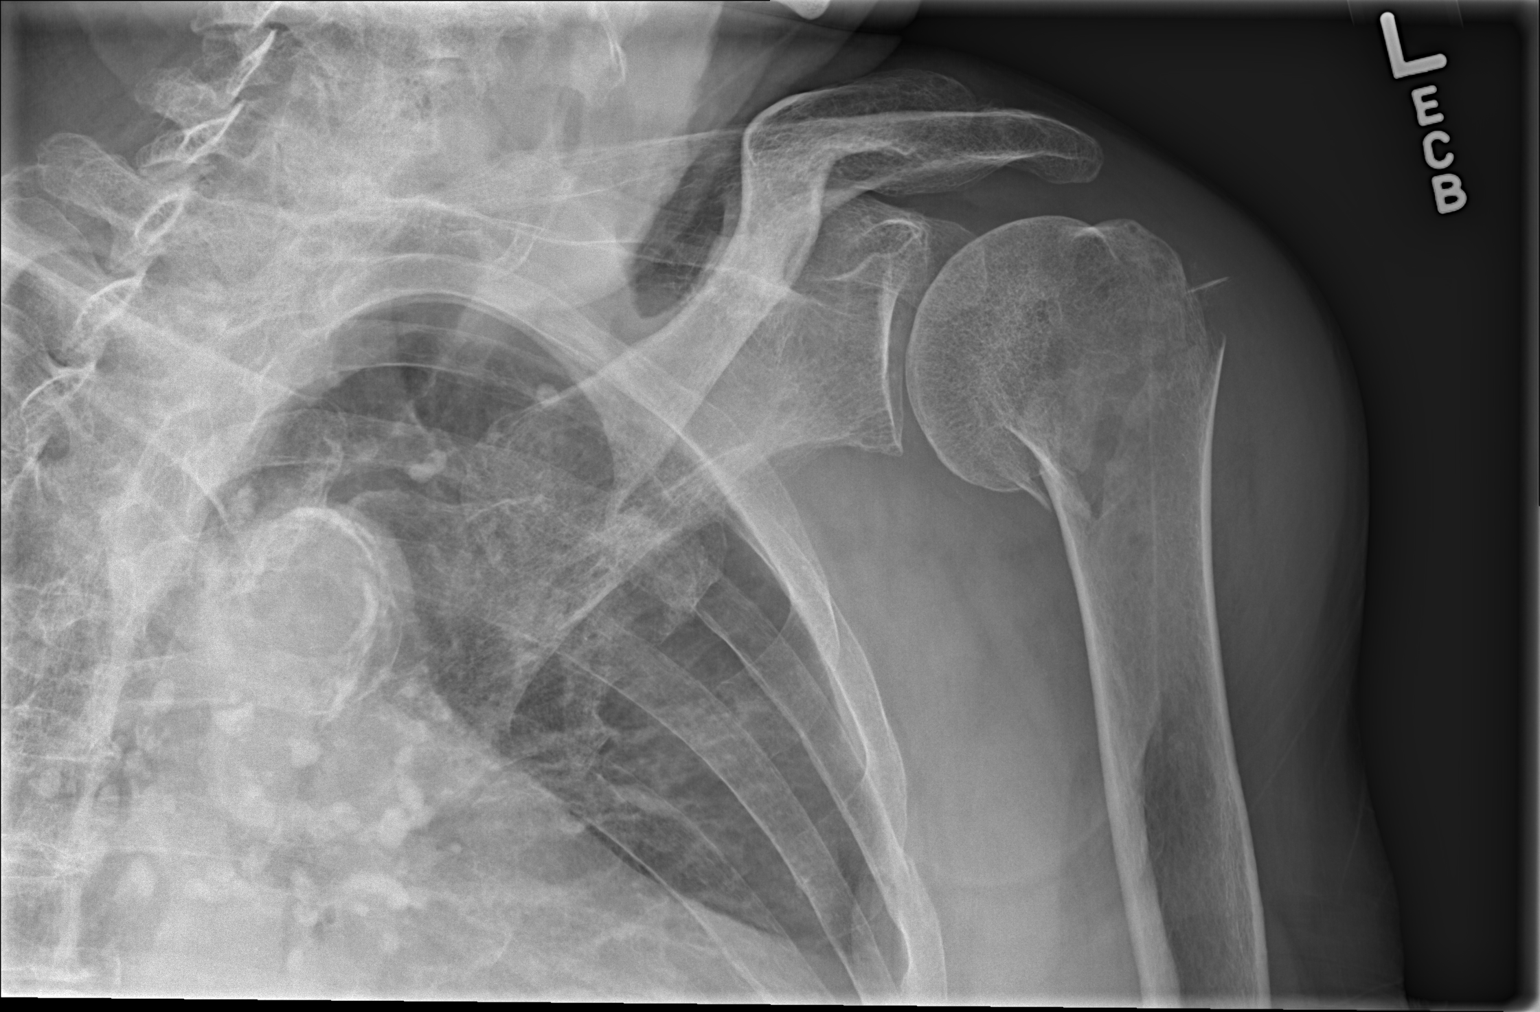

[w shoulder y-view left]
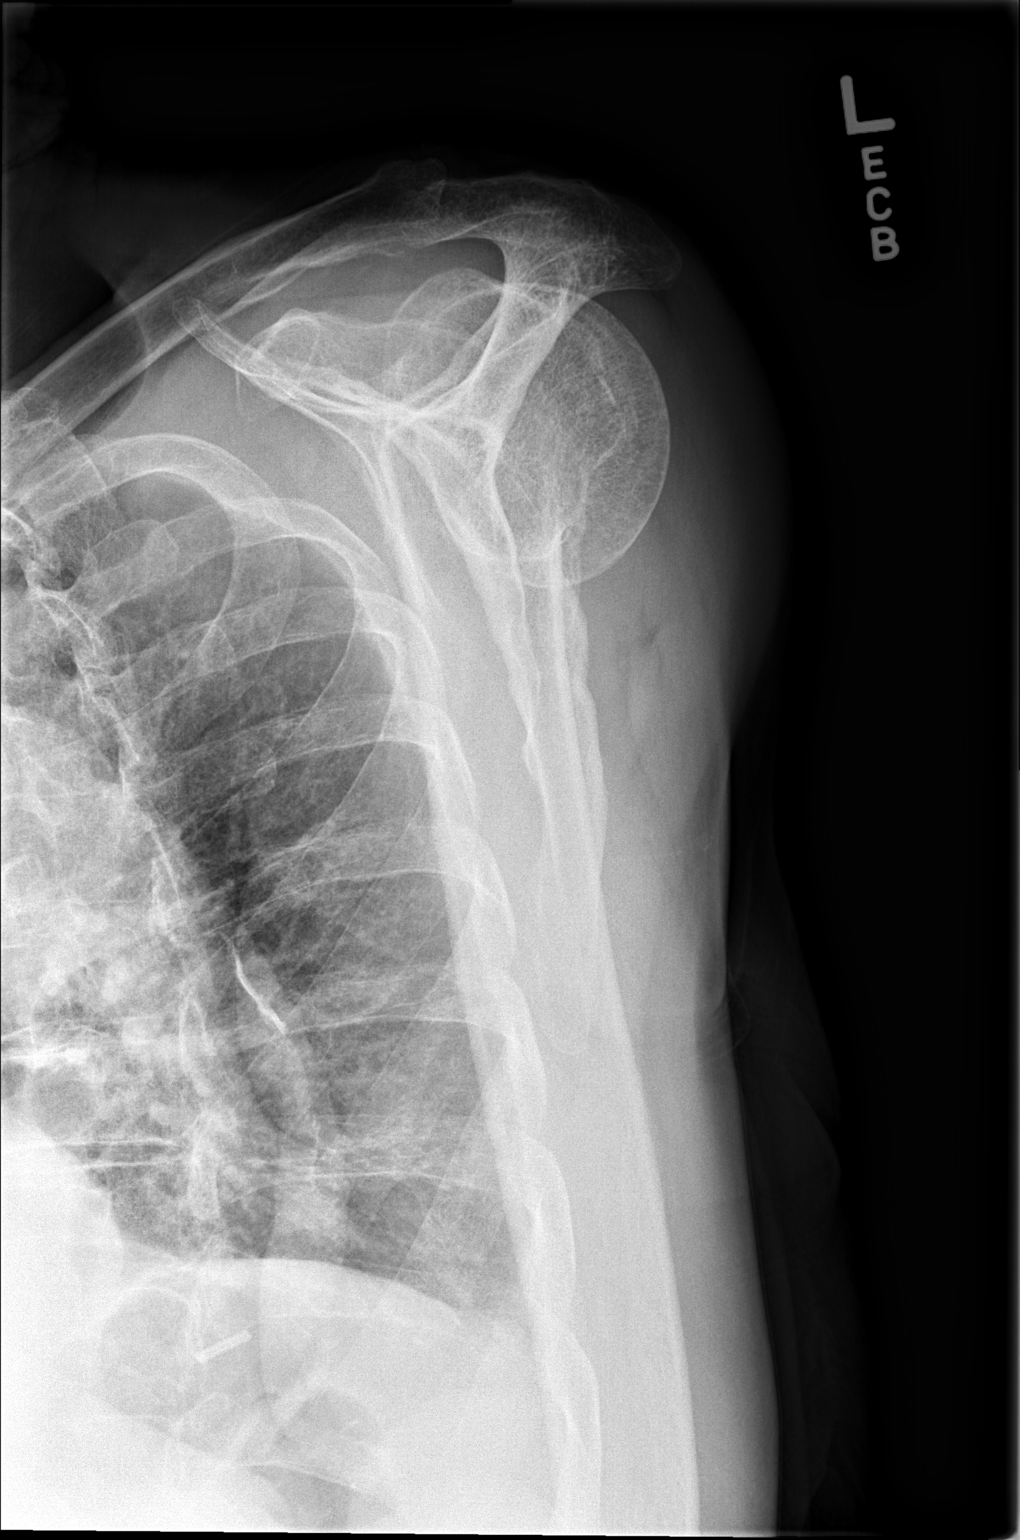

[3 of 3 positions shown; findings below may reference images not displayed]

FINDINGS: There is an acute comminuted fracture of the humeral neck. Main
component of the humeral head remains located relative to the
glenoid. No evidence for shoulder separation. No scapular fracture
evident. Probable old left fourth rib fracture..
IMPRESSION: Comminuted fracture of the humeral neck..

## 2022-02-10 ENCOUNTER — Emergency Department (HOSPITAL_COMMUNITY): Payer: Medicare Other

## 2022-02-10 ENCOUNTER — Encounter (HOSPITAL_COMMUNITY): Payer: Self-pay

## 2022-02-10 ENCOUNTER — Emergency Department (HOSPITAL_COMMUNITY)
Admission: EM | Admit: 2022-02-10 | Discharge: 2022-02-10 | Disposition: A | Discharge status: Home / Self Care | Payer: Medicare Other | Attending: Emergency Medicine | Admitting: Emergency Medicine

## 2022-02-10 ENCOUNTER — Other Ambulatory Visit: Payer: Self-pay

## 2022-02-10 DIAGNOSIS — Z7984 Long term (current) use of oral hypoglycemic drugs: Secondary | ICD-10-CM | POA: Diagnosis not present

## 2022-02-10 DIAGNOSIS — R791 Abnormal coagulation profile: Secondary | ICD-10-CM | POA: Insufficient documentation

## 2022-02-10 DIAGNOSIS — R42 Dizziness and giddiness: Secondary | ICD-10-CM | POA: Insufficient documentation

## 2022-02-10 DIAGNOSIS — S59901A Unspecified injury of right elbow, initial encounter: Secondary | ICD-10-CM | POA: Diagnosis present

## 2022-02-10 DIAGNOSIS — J449 Chronic obstructive pulmonary disease, unspecified: Secondary | ICD-10-CM | POA: Diagnosis not present

## 2022-02-10 DIAGNOSIS — Z7951 Long term (current) use of inhaled steroids: Secondary | ICD-10-CM | POA: Diagnosis not present

## 2022-02-10 DIAGNOSIS — S51011A Laceration without foreign body of right elbow, initial encounter: Secondary | ICD-10-CM | POA: Insufficient documentation

## 2022-02-10 DIAGNOSIS — Z79899 Other long term (current) drug therapy: Secondary | ICD-10-CM | POA: Diagnosis not present

## 2022-02-10 DIAGNOSIS — Z7982 Long term (current) use of aspirin: Secondary | ICD-10-CM | POA: Insufficient documentation

## 2022-02-10 DIAGNOSIS — I10 Essential (primary) hypertension: Secondary | ICD-10-CM | POA: Insufficient documentation

## 2022-02-10 DIAGNOSIS — S12111A Posterior displaced Type II dens fracture, initial encounter for closed fracture: Secondary | ICD-10-CM

## 2022-02-10 DIAGNOSIS — W1839XA Other fall on same level, initial encounter: Secondary | ICD-10-CM | POA: Insufficient documentation

## 2022-02-10 DIAGNOSIS — E119 Type 2 diabetes mellitus without complications: Secondary | ICD-10-CM | POA: Insufficient documentation

## 2022-02-10 DIAGNOSIS — W19XXXA Unspecified fall, initial encounter: Secondary | ICD-10-CM

## 2022-02-10 DIAGNOSIS — Z7902 Long term (current) use of antithrombotics/antiplatelets: Secondary | ICD-10-CM | POA: Insufficient documentation

## 2022-02-10 LAB — COMPREHENSIVE METABOLIC PANEL
ALT: 13 U/L (ref 0–44)
AST: 23 U/L (ref 15–41)
Albumin: 3.2 g/dL — ABNORMAL LOW (ref 3.5–5.0)
Alkaline Phosphatase: 68 U/L (ref 38–126)
Anion gap: 10 (ref 5–15)
BUN: 18 mg/dL (ref 8–23)
CO2: 24 mmol/L (ref 22–32)
Calcium: 8.8 mg/dL — ABNORMAL LOW (ref 8.9–10.3)
Chloride: 99 mmol/L (ref 98–111)
Creatinine, Ser: 1.37 mg/dL — ABNORMAL HIGH (ref 0.61–1.24)
GFR, Estimated: 49 mL/min — ABNORMAL LOW (ref >60)
Glucose, Bld: 99 mg/dL (ref 70–99)
Potassium: 4.9 mmol/L (ref 3.5–5.1)
Sodium: 133 mmol/L — ABNORMAL LOW (ref 135–145)
Total Bilirubin: 0.6 mg/dL (ref 0.3–1.2)
Total Protein: 6.5 g/dL (ref 6.5–8.1)

## 2022-02-10 LAB — CBC
HCT: 35.9 % — ABNORMAL LOW (ref 39.0–52.0)
Hemoglobin: 11.5 g/dL — ABNORMAL LOW (ref 13.0–17.0)
MCH: 29.3 pg (ref 26.0–34.0)
MCHC: 32 g/dL (ref 30.0–36.0)
MCV: 91.6 fL (ref 80.0–100.0)
Platelets: 244 10*3/uL (ref 150–400)
RBC: 3.92 MIL/uL — ABNORMAL LOW (ref 4.22–5.81)
RDW: 14.9 % (ref 11.5–15.5)
WBC: 10.6 10*3/uL — ABNORMAL HIGH (ref 4.0–10.5)
nRBC: 0 % (ref 0.0–0.2)

## 2022-02-10 LAB — I-STAT CHEM 8, ED
BUN: 28 mg/dL — ABNORMAL HIGH (ref 8–23)
Calcium, Ion: 1.06 mmol/L — ABNORMAL LOW (ref 1.15–1.40)
Chloride: 100 mmol/L (ref 98–111)
Creatinine, Ser: 1.3 mg/dL — ABNORMAL HIGH (ref 0.61–1.24)
Glucose, Bld: 99 mg/dL (ref 70–99)
HCT: 40 % (ref 39.0–52.0)
Hemoglobin: 13.6 g/dL (ref 13.0–17.0)
Potassium: 6.2 mmol/L — ABNORMAL HIGH (ref 3.5–5.1)
Sodium: 130 mmol/L — ABNORMAL LOW (ref 135–145)
TCO2: 25 mmol/L (ref 22–32)

## 2022-02-10 LAB — PROTIME-INR
INR: 1.4 — ABNORMAL HIGH (ref 0.8–1.2)
Prothrombin Time: 16.6 seconds — ABNORMAL HIGH (ref 11.4–15.2)

## 2022-02-10 MED ORDER — OXYCODONE HCL 5 MG PO TABS: 5.0000 mg | ORAL_TABLET | Freq: Four times a day (QID) | ORAL | 0 refills | Status: AC | PRN

## 2022-02-10 NOTE — Progress Notes (Signed)
Responded to page to support pt that fell on thinners. No immediate Chaplain services needed. Chaplain available as needed.  Jaclynn Major, [place], [facility],  Pager [phone]

## 2022-02-10 NOTE — Discharge Instructions (Addendum)
Today you were seen in the emergency department for your fall.    In the emergency department you had an EKG and lab work that was reassuring.  You had a CT scan that showed that you broke your neck (type II odontoid fracture).  You are given a hard collar for your neck.  At home, please take Tylenol as needed for any pain that you may have.  You may use oxycodone for any breakthrough pain that you have.    Follow-up with your primary doctor in 2-3 days regarding your visit.  Follow-up with neurosurgery surgery in 3 to 4 weeks regarding your symptoms.  Return immediately to the emergency department if you experience any of the following: Worsening pain, numbness or weakness of your arms or legs, or any other concerning symptoms.    Thank you for visiting our Emergency Department. It was a pleasure taking care of you today.

## 2022-02-10 NOTE — ED Provider Notes (Signed)
[facility] EMERGENCY DEPARTMENT Provider Note   CSN: [id number] Arrival date & time: 02/10/22  1253     History  Chief Complaint  Patient presents with   Benjamin Mccall is a 88 y.o. male.  Patient is an 88 year old male with a past medical history of A-fib on aspirin and Plavix, hypertension, COPD, diabetes presenting to the emergency department after a fall.  The patient reports that he was eating lunch today and after lunch she got up and started to feel lightheaded and fell to the ground.  He states he is unsure if he hit his head.  He denies any loss of consciousness.  Per EMS, the nursing home reported he had multiple falls today.  He denies any chest pain or shortness of breath, numbness or weakness.  He denies any pain or injury from the fall.  He denies any recent fevers, nausea, vomiting or diarrhea.  Per records tetanus was last updated in 2016.  The history is provided by the patient, the nursing home and the EMS personnel.  Fall       Home Medications Prior to Admission medications   Medication Sig Start Date End Date Taking? Authorizing Provider  acetaminophen (TYLENOL) 325 MG tablet Take 1 tablet (325 mg total) by mouth every 6 (six) hours as needed. 01/06/18   Fawze, Mina A, PA-C  albuterol (PROVENTIL HFA;VENTOLIN HFA) 108 (90 BASE) MCG/ACT inhaler Inhale 2 puffs into the lungs every 4 (four) hours as needed for wheezing or shortness of breath.     [provider]  amoxicillin (AMOXIL) 500 MG capsule Take 2,000 mg by mouth as directed. Take 4 capsules by mouth 1 hour before dental appointment    [provider]  aspirin 81 MG tablet Take 81 mg by mouth every morning.     [provider]  atorvastatin (LIPITOR) 10 MG tablet Take 1 tablet (10 mg total) by mouth daily. 09/02/17   Dragnev, Caesar Chestnut, NP  Cholecalciferol (VITAMIN D3) 1000 UNITS CAPS Take 1,000 Units by mouth every morning.     [provider]  citalopram (CELEXA) 10 MG tablet Take 10 mg by mouth every morning.     [provider]  clopidogrel (PLAVIX) 75 MG tablet Take 75 mg by mouth every morning.     [provider]  CREON 12000 units CPEP capsule Take 36,000 mg by mouth 3 (three) times daily.  11/19/15   [provider]  diltiazem (CARDIZEM) 60 MG tablet Take 1 tablet (60 mg total) by mouth daily. 10/06/19   Tommie Raymond, NP  famotidine (PEPCID) 20 MG tablet Take 1 tablet (20 mg total) by mouth 2 (two) times daily. 07/01/18   Margot Ables, NP  gabapentin (NEURONTIN) 300 MG capsule Take 300 mg by mouth at bedtime.    [provider]  Glycopyrrolate-Formoterol (BEVESPI AEROSPHERE) 9-4.8 MCG/ACT AERO Inhale 1 puff into the lungs 2 (two) times daily. 02/23/17   Tanda Rockers, MD  guaiFENesin (GERI-TUSSIN) 100 MG/5ML liquid Take 10 mLs by mouth 3 (three) times daily as needed for cough.    [provider]  guaiFENesin (MUCINEX) 600 MG 12 hr tablet Take 600 mg by mouth 2 (two) times daily as needed for cough.     [provider]  hydrocortisone cream 1 % Apply 1 application topically daily as needed for itching.    [provider]  Iron-Vitamins (THEREMS H PO) Take 1 tablet by mouth  daily.    [provider]  levothyroxine (SYNTHROID, LEVOTHROID) 75 MCG tablet Take 1 tablet (75 mcg total) by mouth every morning. 10/27/17   Olive Bass, FNP  metFORMIN (GLUCOPHAGE) 500 MG tablet Take 500 mg by mouth daily.     [provider]  Multiple Vitamins-Minerals (THEREMS-M) TABS Take 1 tablet by mouth daily.    [provider]  nitroGLYCERIN (NITRODUR - DOSED IN MG/24 HR) 0.6 mg/hr patch Apply patch every morning and remove at bedtime    [provider]  nitroGLYCERIN (NITROSTAT) 0.4 MG SL tablet Place 0.4 mg under the tongue every 5 (five) minutes as needed for chest pain.    [provider]  omeprazole  (PRILOSEC) 20 MG capsule Take 20 mg by mouth daily.  04/07/17   [provider]  polyethylene glycol powder (GLYCOLAX/MIRALAX) powder Take 17 g by mouth daily.     [provider]  polyvinyl alcohol (ARTIFICIAL TEARS) 1.4 % ophthalmic solution Place 1 drop into both eyes 3 (three) times daily as needed for dry eyes.    [provider]  simethicone (GAS-X EXTRA STRENGTH) 125 MG chewable tablet Chew 1-2 tablets (125-250 mg total) by mouth 3 (three) times daily with meals. Max 4 tablets daily 02/18/15   Veryl Speak, FNP  sodium chloride (OCEAN) 0.65 % SOLN nasal spray Place 1 spray into both nostrils as needed for congestion.    [provider]  sodium fluoride (DENTAGEL) 1.1 % GEL dental gel Place 1 application onto teeth 3 (three) times daily.    [provider]      Allergies    Reglan [metoclopramide] and Sulfa antibiotics    Review of Systems   Review of Systems  Physical Exam Updated Vital Signs BP (!) 180/75   Pulse (!) 59   Temp 98.5 F (36.9 C) (Oral)   Resp 18   Ht 5\' 4"  (1.626 m)   Wt 70 kg   SpO2 99%   BMI 26.49 kg/m  Physical Exam Vitals and nursing note reviewed.  Constitutional:      General: He is not in acute distress.    Appearance: Normal appearance.  HENT:     Head: Normocephalic and atraumatic.     Nose: Nose normal.     Mouth/Throat:     Mouth: Mucous membranes are moist.     Pharynx: Oropharynx is clear.  Eyes:     Extraocular Movements: Extraocular movements intact.     Conjunctiva/sclera: Conjunctivae normal.     Pupils: Pupils are equal, round, and reactive to light.  Neck:     Comments: No midline neck tenderness Cardiovascular:     Rate and Rhythm: Normal rate and regular rhythm.  Pulmonary:     Effort: Pulmonary effort is normal.     Breath sounds: Normal breath sounds.  Abdominal:     General: Abdomen is flat.     Palpations: Abdomen is soft.     Tenderness: There is no abdominal  tenderness.  Musculoskeletal:        General: No swelling, tenderness or deformity. Normal range of motion.     Cervical back: Normal range of motion and neck supple.     Comments: Midline back tenderness No tenderness to palpation of bilateral upper and lower extremities Pelvis stable, nontender   Skin:    General: Skin is warm and dry.     Comments: Skin tear to right elbow with no active bleeding  Neurological:  General: No focal deficit present.     Mental Status: He is alert and oriented to person, place, and time.     Cranial Nerves: No cranial nerve deficit.     Sensory: No sensory deficit.     Motor: No weakness.     ED Results / Procedures / Treatments   Labs (all labs ordered are listed, but only abnormal results are displayed) Labs Reviewed  I-STAT CHEM 8, ED - Abnormal; Notable for the following components:      Result Value   Sodium 130 (*)    Potassium 6.2 (*)    BUN 28 (*)    Creatinine, Ser 1.30 (*)    Calcium, Ion 1.06 (*)    All other components within normal limits  COMPREHENSIVE METABOLIC PANEL  CBC  ETHANOL  URINALYSIS, ROUTINE W REFLEX MICROSCOPIC  LACTIC ACID, PLASMA  PROTIME-INR  SAMPLE TO BLOOD BANK    EKG None  Radiology DG Pelvis Portable  Result Date: 02/10/2022 CLINICAL DATA:  Fall.  Trauma EXAM: PORTABLE PELVIS 1-2 VIEWS COMPARISON:  09/02/2017 FINDINGS: Bones are demineralized. There is no evidence of pelvic fracture or diastasis. Hip joints intact. Stable calcifications within the left hemiabdomen. Atherosclerotic vascular calcifications. IMPRESSION: No acute osseous abnormality of the pelvis. Electronically Signed   By: Duanne Guess D.O.   On: 02/10/2022 13:24    Procedures Procedures    Medications Ordered in ED Medications - No data to display  ED Course/ Medical Decision Making/ A&P Clinical Course as of 02/10/22 1511  Tue Feb 10, 2022  1404 CT with evidence of Type II dens fracture. C-collar attempted to be placed  but patient agitated and refusing. [VK]  1454 Assumed care from Dr Theresia Lo. 88 yo M with hx of possible dementia and AF on ASA and plavix who presented with a fall after feeling light headed. Per EMS had multiple falls today. Has skin tears on BL elbows. No neuro deficits. CT c-spine with type II odontoid fracture that appears acute. Also getting presyncope workup. Lives at SNF. Family will likely have to make decisions about operating on the patient. iSTAT with mild hyperkalemia but will get formal labs. EKG without hyperkalemia.  [RP]  1509 Dr Julien Girt from [facility] called. Feels that if no neuro deficits will keep in c-collar and fu in clinic in 3-4 weeks.  [RP]    Clinical Course User Index [RP] Rondel Baton, MD [VK] Rexford Maus, DO                           Medical Decision Making This patient presents to the ED with chief complaint(s) of lightheadedness and fall with pertinent past medical history of A-fib on aspirin and Plavix, hypertension, COPD, diabetes which further complicates the presenting complaint. The complaint involves an extensive differential diagnosis and also carries with it a high risk of complications and morbidity.    The differential diagnosis includes syncope, arrhythmia, electrolyte abnormality, anemia, infection, ICH, mass effect, cervical spine fracture, no other traumatic injuries seen on exam  Additional history obtained: Additional history obtained from EMS  and nursing home/care facility Records reviewed Nursing Home Documents  ED Course and Reassessment: On patient's arrival he is awake and alert and well-appearing, made a level 2 trauma due to his fall on thinners.  Primary survey was intact.  Secondary survey was significant for abrasions to bilateral elbows.  He has no neurologic deficits.  He will have CT head and  C-spine performed as well as chest and pelvics x-ray.  EKG and labs will be performed to evaluate for cause of his  presyncope.  Independent labs interpretation:  The following labs were independently interpreted: CMP without significant abnormality, otherwise labs are pending  Independent visualization of imaging: - I independently visualized the following imaging with scope of interpretation limited to determining acute life threatening conditions related to emergency care: CT head/C-spine, chest x-ray, pelvis x-ray, which revealed CT head, chest x-ray and pelvis x-ray without acute traumatic injury, CT C-spine with type II odontoid fracture  Consultation: - Consulted or discussed management/test interpretation w/ external professional: Neurosurgery     Amount and/or Complexity of Data Reviewed Labs: ordered. Radiology: ordered. ECG/medicine tests: ordered.          Final Clinical Impression(s) / ED Diagnoses Final diagnoses:  None    Rx / DC Orders ED Discharge Orders     None         Rexford Maus, DO 02/10/22 1515

## 2022-02-10 NOTE — ED Notes (Signed)
Patient refusing ccollar at this time.

## 2022-02-10 NOTE — ED Notes (Signed)
Attempted to retrieve Sample to Blood Bank and pt refused blood work

## 2022-02-10 NOTE — ED Notes (Signed)
Patient arguing about ccollar. Patient informed of the risks of taking off the collar

## 2022-02-10 NOTE — ED Triage Notes (Signed)
Patient had multiple falls today. Unknown if he hit his head. Patient is on Plavix. Per EMS, initial crew stated he was altered but for this crew he is A&Ox4. Patient has no neck or back pain176/90 HR84 RR20  BG130

## 2022-02-10 NOTE — ED Provider Notes (Signed)
  Physical Exam  BP (!) 200/113   Pulse 88   Temp 98.4 F (36.9 C) (Oral)   Resp 20   Ht 5\' 4"  (1.626 m)   Wt 70 kg   SpO2 98%   BMI 26.49 kg/m   Physical Exam  Procedures  Procedures  ED Course / MDM   Clinical Course as of 02/10/22 1707  Tue Feb 10, 2022  1404 CT with evidence of Type II dens fracture. C-collar attempted to be placed but patient agitated and refusing. [VK]  1454 Assumed care from Dr Maylon Peppers. 88 yo M with hx of possible dementia and AF on ASA and plavix who presented with a fall after feeling light headed. Per EMS had multiple falls today. Has skin tears on BL elbows. No neuro deficits. CT c-spine with type II odontoid fracture that appears acute. Also getting presyncope workup. Lives at SNF. Family will likely have to make decisions about operating on the patient. iSTAT with mild hyperkalemia but will get formal labs. EKG without hyperkalemia.  [RP]  1509 NP McDaniel from [facility] called. Feels that if no neuro deficits will keep in c-collar and fu in clinic in 3-4 weeks.  [RP]  3016 Lives at [facility]. C-collar placed on patient.  [RP]  1601 Repeat labs unremarkable and feel that hyperkalemia was likely due to hemolysis on i-STAT machine.  Patient refusing to wear neck brace.  Explained to him the risks and benefits of wearing the neck brace.  He understands that without the neck brace there is high risk for paralysis but insists on having it taken off.  Discussed this with his daughter as well who is aware of the risks and has discussed this at length with her father.  He is not having any urinary symptoms that would warrant UTI.  We will ambulate the patient and have him follow-up with his primary doctor and neurosurgery as an outpatient. [RP]  0109 Patient able to ambulate in the emergency department without difficulty.  Explained again to the patient and his daughter that he needs to wear the hard brace.  Was given a short prescription of oxycodone for  pain instructed to take Tylenol as well.  Informed him to follow-up with his primary doctor in several days as well.  Return precautions discussed prior to discharge. [RP]    Clinical Course User Index [RP] Fransico Meadow, MD [VK] Kemper Durie, DO   Medical Decision Making Amount and/or Complexity of Data Reviewed Labs: ordered. Radiology: ordered. ECG/medicine tests: ordered.  Risk Prescription drug management.     Fransico Meadow, MD 02/10/22 [phone]

## 2022-02-10 NOTE — ED Notes (Signed)
Patient able to ambulate with walker. Patient becoming aggressive with staff. Patient believes he is at home and wants [place] all to leave. Patient is standing in middle of hallway, telling staff to get away from him. Patient is yelling that he will call 911 and threatens staff with lawsuit

## 2022-04-24 ENCOUNTER — Emergency Department (HOSPITAL_COMMUNITY): Payer: Medicare Other

## 2022-04-24 ENCOUNTER — Encounter (HOSPITAL_COMMUNITY): Payer: Self-pay | Admitting: *Deleted

## 2022-04-24 ENCOUNTER — Other Ambulatory Visit: Payer: Self-pay

## 2022-04-24 ENCOUNTER — Emergency Department (HOSPITAL_COMMUNITY)
Admission: EM | Admit: 2022-04-24 | Discharge: 2022-04-24 | Disposition: A | Discharge status: Home / Self Care | Payer: Medicare Other | Source: Home / Self Care | Attending: Emergency Medicine | Admitting: Emergency Medicine

## 2022-04-24 DIAGNOSIS — Z20822 Contact with and (suspected) exposure to covid-19: Secondary | ICD-10-CM | POA: Insufficient documentation

## 2022-04-24 DIAGNOSIS — J189 Pneumonia, unspecified organism: Secondary | ICD-10-CM | POA: Insufficient documentation

## 2022-04-24 DIAGNOSIS — E860 Dehydration: Secondary | ICD-10-CM

## 2022-04-24 DIAGNOSIS — J690 Pneumonitis due to inhalation of food and vomit: Secondary | ICD-10-CM | POA: Diagnosis not present

## 2022-04-24 DIAGNOSIS — S51011A Laceration without foreign body of right elbow, initial encounter: Secondary | ICD-10-CM

## 2022-04-24 DIAGNOSIS — S0003XA Contusion of scalp, initial encounter: Secondary | ICD-10-CM | POA: Insufficient documentation

## 2022-04-24 DIAGNOSIS — R0602 Shortness of breath: Secondary | ICD-10-CM | POA: Diagnosis not present

## 2022-04-24 DIAGNOSIS — N179 Acute kidney failure, unspecified: Secondary | ICD-10-CM

## 2022-04-24 DIAGNOSIS — Z7982 Long term (current) use of aspirin: Secondary | ICD-10-CM | POA: Insufficient documentation

## 2022-04-24 DIAGNOSIS — W19XXXA Unspecified fall, initial encounter: Secondary | ICD-10-CM

## 2022-04-24 DIAGNOSIS — F0150 Vascular dementia without behavioral disturbance: Secondary | ICD-10-CM

## 2022-04-24 LAB — URINALYSIS, ROUTINE W REFLEX MICROSCOPIC
Bilirubin Urine: NEGATIVE
Glucose, UA: NEGATIVE mg/dL
Hgb urine dipstick: NEGATIVE
Ketones, ur: NEGATIVE mg/dL
Leukocytes,Ua: NEGATIVE
Nitrite: NEGATIVE
Protein, ur: 30 mg/dL — AB
Specific Gravity, Urine: 1.018 (ref 1.005–1.030)
pH: 5 (ref 5.0–8.0)

## 2022-04-24 LAB — BASIC METABOLIC PANEL
Anion gap: 10 (ref 5–15)
BUN: 37 mg/dL — ABNORMAL HIGH (ref 8–23)
CO2: 21 mmol/L — ABNORMAL LOW (ref 22–32)
Calcium: 8.5 mg/dL — ABNORMAL LOW (ref 8.9–10.3)
Chloride: 102 mmol/L (ref 98–111)
Creatinine, Ser: 2.02 mg/dL — ABNORMAL HIGH (ref 0.61–1.24)
GFR, Estimated: 31 mL/min — ABNORMAL LOW (ref >60)
Glucose, Bld: 150 mg/dL — ABNORMAL HIGH (ref 70–99)
Potassium: 4.5 mmol/L (ref 3.5–5.1)
Sodium: 133 mmol/L — ABNORMAL LOW (ref 135–145)

## 2022-04-24 LAB — CBC
HCT: 37.3 % — ABNORMAL LOW (ref 39.0–52.0)
Hemoglobin: 12.4 g/dL — ABNORMAL LOW (ref 13.0–17.0)
MCH: 30.7 pg (ref 26.0–34.0)
MCHC: 33.2 g/dL (ref 30.0–36.0)
MCV: 92.3 fL (ref 80.0–100.0)
Platelets: 291 10*3/uL (ref 150–400)
RBC: 4.04 MIL/uL — ABNORMAL LOW (ref 4.22–5.81)
RDW: 15.1 % (ref 11.5–15.5)
WBC: 17.3 10*3/uL — ABNORMAL HIGH (ref 4.0–10.5)
nRBC: 0 % (ref 0.0–0.2)

## 2022-04-24 LAB — RESP PANEL BY RT-PCR (RSV, FLU A&B, COVID)  RVPGX2
Influenza A by PCR: NEGATIVE
Influenza B by PCR: NEGATIVE
Resp Syncytial Virus by PCR: NEGATIVE
SARS Coronavirus 2 by RT PCR: NEGATIVE

## 2022-04-24 LAB — LACTIC ACID, PLASMA: Lactic Acid, Venous: 1.4 mmol/L (ref 0.5–1.9)

## 2022-04-24 MED ORDER — CEFDINIR 300 MG PO CAPS: 300.0000 mg | ORAL_CAPSULE | Freq: Two times a day (BID) | ORAL | 0 refills | Status: DC

## 2022-04-24 MED ORDER — LACTATED RINGERS IV BOLUS
1000.0000 mL | Freq: Once | INTRAVENOUS | Status: AC
  Administered 2022-04-24: 1000 mL via INTRAVENOUS

## 2022-04-24 MED ORDER — SODIUM CHLORIDE 0.9 % IV SOLN
1.0000 g | Freq: Once | INTRAVENOUS | Status: AC
  Administered 2022-04-24: 1 g via INTRAVENOUS
  Filled 2022-04-24: qty 10

## 2022-04-24 MED ORDER — SODIUM CHLORIDE 0.9 % IV BOLUS
1000.0000 mL | Freq: Once | INTRAVENOUS | Status: AC
  Administered 2022-04-24: 1000 mL via INTRAVENOUS

## 2022-04-24 MED ORDER — AZITHROMYCIN 250 MG PO TABS: 250.0000 mg | ORAL_TABLET | Freq: Every day | ORAL | 0 refills | Status: DC

## 2022-04-24 MED ORDER — ACETAMINOPHEN 325 MG PO TABS
650.0000 mg | ORAL_TABLET | Freq: Once | ORAL | Status: AC
  Administered 2022-04-24: 650 mg via ORAL
  Filled 2022-04-24: qty 2

## 2022-04-24 MED ORDER — SODIUM CHLORIDE 0.9 % IV SOLN
500.0000 mg | Freq: Once | INTRAVENOUS | Status: AC
  Administered 2022-04-24: 500 mg via INTRAVENOUS
  Filled 2022-04-24: qty 5

## 2022-04-24 NOTE — ED Notes (Signed)
Bladder scan done 64 ml in bladder.

## 2022-04-24 NOTE — ED Notes (Signed)
Pt family member agreeable to transport pt back to facility.

## 2022-04-24 NOTE — ED Notes (Signed)
Report given to [facility].

## 2022-04-24 NOTE — ED Triage Notes (Signed)
Patient presents to ed via [facility] from [facility], states he fell last pm hit his head and has a skin tear to the right upper arm bleeding controled. Staff got patient up and cleaned him up he went to  bed, this am when they tried to wait him up he wasn't as alert as he normally was. Upon arrival alert oriented to place and month. States he remembers falling last pm however doesn't remember why he fell.

## 2022-04-24 NOTE — ED Provider Notes (Signed)
[facility] EMERGENCY DEPARTMENT Provider Note   CSN: [id number] Arrival date & time: 04/24/22  [id number]     History  Chief Complaint  Patient presents with   Trauma    Benjamin Mccall is a 88 y.o. male.  Pt arrives via EMS s/p fall yesterday. Pt with mechanical fall last pm, hit head, skin tear to right elbow area. Pt on plavix. This AM appeared generally weak so staff sent to ED.  Pt denies specific c/o or pain. No headache. No neck or back pain. No chest pain or sob. No abd pain or nvd. No gu c/o. No extremity pain or injury other than skin tear to RUE. Tetanus utd.   The history is provided by the patient, the EMS personnel and medical records.  Trauma   Current symptoms:      Associated symptoms:            Denies abdominal pain, back pain, chest pain, headache, neck pain and vomiting.       Home Medications Prior to Admission medications   Medication Sig Start Date End Date Taking? Authorizing Provider  acetaminophen (TYLENOL) 500 MG tablet Take 1,000 mg by mouth every 8 (eight) hours as needed for moderate pain.    [provider]  albuterol (PROVENTIL HFA;VENTOLIN HFA) 108 (90 BASE) MCG/ACT inhaler Inhale 2 puffs into the lungs every 4 (four) hours as needed for wheezing or shortness of breath.    [provider]  amoxicillin (AMOXIL) 500 MG capsule Take 2,000 mg by mouth as directed. Take 4 capsules by mouth 1 hour before dental appointment    [provider]  aspirin 81 MG tablet Take 81 mg by mouth every morning.     [provider]  atorvastatin (LIPITOR) 10 MG tablet Take 1 tablet (10 mg total) by mouth daily. 09/02/17   Dragnev, Caesar Chestnut, NP  Cholecalciferol (VITAMIN D3) 1000 UNITS CAPS Take 1,000 Units by mouth every morning.     [provider]  citalopram (CELEXA) 10 MG tablet Take 10 mg by mouth every morning.     [provider]  clopidogrel (PLAVIX) 75 MG tablet Take 75 mg by mouth  every morning.     [provider]  CREON 12000 units CPEP capsule Take 36,000 mg by mouth 3 (three) times daily.  11/19/15   [provider]  famotidine (PEPCID) 20 MG tablet Take 1 tablet (20 mg total) by mouth 2 (two) times daily. 07/01/18   Margot Ables, NP  ferrous sulfate 325 (65 FE) MG tablet Take 325 mg by mouth every Monday, Wednesday, and Friday.    [provider]  gabapentin (NEURONTIN) 300 MG capsule Take 300 mg by mouth at bedtime.    [provider]  guaiFENesin (GERI-TUSSIN) 100 MG/5ML liquid Take 10 mLs by mouth every 4 (four) hours as needed for cough.    [provider]  guaiFENesin (MUCINEX) 600 MG 12 hr tablet Take 600 mg by mouth every 12 (twelve) hours as needed (congestion).    [provider]  hydrocortisone cream 1 % Apply 1 application  topically 4 (four) times daily as needed for itching.    [provider]  levothyroxine (SYNTHROID, LEVOTHROID) 75 MCG tablet Take 1 tablet (75 mcg total) by mouth every morning. 10/27/17   Marrian Salvage, FNP  loperamide (ANTI-DIARRHEAL) 2 MG tablet Take 2 mg by mouth 4 (four) times daily as needed for diarrhea or loose stools.  [provider]  LORazepam (ATIVAN) 0.5 MG tablet Take 0.5 mg by mouth daily as needed for anxiety (agitation).    [provider]  losartan (COZAAR) 100 MG tablet Take 100 mg by mouth daily.    [provider]  memantine (NAMENDA) 5 MG tablet Take 5 mg by mouth daily.    [provider]  metFORMIN (GLUCOPHAGE) 500 MG tablet Take 500 mg by mouth daily.     [provider]  Multiple Vitamin (TAB-A-VITE PO) Take 1 tablet by mouth daily.    [provider]  Neomycin-Bacitracin-Polymyxin (TRIPLE ANTIBIOTIC) OINT Apply 1 Application topically daily as needed (skin tear).    [provider]  nitroGLYCERIN (NITRODUR - DOSED IN MG/24 HR) 0.1 mg/hr patch Place 0.1 mg onto the skin  daily. Apply patch every morning and remove at bedtime    [provider]  nitroGLYCERIN (NITROSTAT) 0.4 MG SL tablet Place 0.4 mg under the tongue every 5 (five) minutes as needed for chest pain.    [provider]  omeprazole (PRILOSEC) 20 MG capsule Take 20 mg by mouth daily.  04/07/17   [provider]  oxyCODONE (ROXICODONE) 5 MG immediate release tablet Take 1 tablet (5 mg total) by mouth every 6 (six) hours as needed for severe pain. 02/10/22   Fransico Meadow, MD  polyethylene glycol powder (GLYCOLAX/MIRALAX) powder Take 17 g by mouth daily.     [provider]  polyvinyl alcohol (ARTIFICIAL TEARS) 1.4 % ophthalmic solution Place 1 drop into both eyes 3 (three) times daily as needed for dry eyes.    [provider]  simethicone (GAS-X EXTRA STRENGTH) 125 MG chewable tablet Chew 1-2 tablets (125-250 mg total) by mouth 3 (three) times daily with meals. Max 4 tablets daily Patient taking differently: Chew 125 mg by mouth 4 (four) times daily as needed (bloating). 02/18/15   Golden Circle, FNP  sodium chloride (OCEAN) 0.65 % SOLN nasal spray Place 1 spray into both nostrils as needed for congestion.    [provider]  sodium fluoride (DENTAGEL) 1.1 % GEL dental gel Place 1 application onto teeth 3 (three) times daily.    [provider]  spironolactone (ALDACTONE) 25 MG tablet Take 25 mg by mouth daily.    [provider]  umeclidinium-vilanterol (ANORO ELLIPTA) 62.5-25 MCG/ACT AEPB Inhale 1 puff into the lungs daily.    [provider]  verapamil (CALAN) 40 MG tablet Take 40 mg by mouth 2 (two) times daily.    [provider]      Allergies    Reglan [metoclopramide] and Sulfa antibiotics    Review of Systems   Review of Systems  Constitutional:  Negative for fever.  HENT:  Negative for sore throat.   Eyes:  Negative for visual disturbance.  Respiratory:  Negative for cough and shortness of  breath.   Cardiovascular:  Negative for chest pain.  Gastrointestinal:  Negative for abdominal pain, diarrhea and vomiting.  Genitourinary:  Negative for dysuria and flank pain.  Musculoskeletal:  Negative for back pain and neck pain.  Skin:  Negative for rash.  Neurological:  Negative for headaches.    Physical Exam Updated Vital Signs BP (!) 132/92 (BP Location: Left Arm)   Pulse 75   Temp 98 F (36.7 C) (Temporal)   Resp 20   Ht 1.753 m (5\' 9" )   SpO2 98%   BMI 22.79 kg/m  Physical Exam Vitals and nursing note reviewed.  Constitutional:  Appearance: Normal appearance. He is well-developed.  HENT:     Head: Atraumatic.     Nose: Nose normal.     Mouth/Throat:     Mouth: Mucous membranes are moist.     Pharynx: Oropharynx is clear.  Eyes:     General: No scleral icterus.    Conjunctiva/sclera: Conjunctivae normal.     Pupils: Pupils are equal, round, and reactive to light.  Neck:     Vascular: No carotid bruit.     Trachea: No tracheal deviation.     Comments: No stiffness or rigidity. Normal rom.  Cardiovascular:     Rate and Rhythm: Normal rate and regular rhythm.     Pulses: Normal pulses.     Heart sounds: Normal heart sounds. No murmur heard.    No friction rub. No gallop.  Pulmonary:     Effort: Pulmonary effort is normal. No accessory muscle usage or respiratory distress.     Breath sounds: Normal breath sounds.  Abdominal:     General: Bowel sounds are normal. There is no distension.     Palpations: Abdomen is soft.     Tenderness: There is no abdominal tenderness.  Genitourinary:    Comments: No cva tenderness. Musculoskeletal:        General: No swelling.     Cervical back: Normal range of motion and neck supple. No rigidity or tenderness.     Comments: CTLS spine, non tender, aligned, no step off. Good rom bil extremities without pain or focal bony tenderness. Superficial skin tear to right upper arm ~ 4 cm.   Skin:    General: Skin is warm  and dry.     Findings: No rash.  Neurological:     Mental Status: He is alert.     Comments: Alert, speech clear. Gcs 15. Motor/sens grossly intact bil. Stre 5/5.    Psychiatric:        Mood and Affect: Mood normal.     ED Results / Procedures / Treatments   Labs (all labs ordered are listed, but only abnormal results are displayed) Results for orders placed or performed during the hospital encounter of 04/24/22  Resp panel by RT-PCR (RSV, Flu A&B, Covid) Anterior Nasal Swab   Specimen: Anterior Nasal Swab  Result Value Ref Range   SARS Coronavirus 2 by RT PCR NEGATIVE NEGATIVE   Influenza A by PCR NEGATIVE NEGATIVE   Influenza B by PCR NEGATIVE NEGATIVE   Resp Syncytial Virus by PCR NEGATIVE NEGATIVE  Basic metabolic panel  Result Value Ref Range   Sodium 133 (L) 135 - 145 mmol/L   Potassium 4.5 3.5 - 5.1 mmol/L   Chloride 102 98 - 111 mmol/L   CO2 21 (L) 22 - 32 mmol/L   Glucose, Bld 150 (H) 70 - 99 mg/dL   BUN 37 (H) 8 - 23 mg/dL   Creatinine, Ser 2.02 (H) 0.61 - 1.24 mg/dL   Calcium 8.5 (L) 8.9 - 10.3 mg/dL   GFR, Estimated 31 (L) >60 mL/min   Anion gap 10 5 - 15  CBC  Result Value Ref Range   WBC 17.3 (H) 4.0 - 10.5 K/uL   RBC 4.04 (L) 4.22 - 5.81 MIL/uL   Hemoglobin 12.4 (L) 13.0 - 17.0 g/dL   HCT 37.3 (L) 39.0 - 52.0 %   MCV 92.3 80.0 - 100.0 fL   MCH 30.7 26.0 - 34.0 pg   MCHC 33.2 30.0 - 36.0 g/dL   RDW 15.1 11.5 - 15.5 %  Platelets 291 150 - 400 K/uL   nRBC 0.0 0.0 - 0.2 %    EKG None  Radiology CT Head Wo Contrast  Result Date: 04/24/2022 CLINICAL DATA:  Head trauma, minor (Age >= 65y) EXAM: CT HEAD WITHOUT CONTRAST TECHNIQUE: Contiguous axial images were obtained from the base of the skull through the vertex without intravenous contrast. RADIATION DOSE REDUCTION: This exam was performed according to the departmental dose-optimization program which includes automated exposure control, adjustment of the mA and/or kV according to patient size and/or  use of iterative reconstruction technique. COMPARISON:  CT head October 17, 23 FINDINGS: Brain: No evidence of acute infarction, hemorrhage, hydrocephalus, extra-axial collection or mass lesion/mass effect. Chronic microvascular ischemic disease. Cerebral atrophy. Vascular: No hyperdense vessel.  Calcific atherosclerosis. Skull: No acute fracture. Sinuses/Orbits: Clear sinuses.  No acute orbital findings. Other: No mastoid effusions. IMPRESSION: No evidence of acute intracranial abnormality. Electronically Signed   By: Feliberto Harts M.D.   On: 04/24/2022 08:44    Procedures Procedures    Medications Ordered in ED Medications  sodium chloride 0.9 % bolus 1,000 mL (has no administration in time range)    ED Course/ Medical Decision Making/ A&P                           Medical Decision Making Problems Addressed: Accidental fall, initial encounter: acute illness or injury with systemic symptoms that poses a threat to life or bodily functions Acute pneumonia: acute illness or injury with systemic symptoms that poses a threat to life or bodily functions AKI (acute kidney injury) (HCC): acute illness or injury with systemic symptoms that poses a threat to life or bodily functions Contusion of scalp, initial encounter: acute illness or injury that poses a threat to life or bodily functions Dehydration: acute illness or injury with systemic symptoms that poses a threat to life or bodily functions Skin tear of right elbow without complication, initial encounter: acute illness or injury  Amount and/or Complexity of Data Reviewed Independent Historian: EMS    Details: hx External Data Reviewed: labs and notes. Labs: ordered. Decision-making details documented in ED Course. Radiology: ordered and independent interpretation performed. Decision-making details documented in ED Course. ECG/medicine tests: ordered and independent interpretation performed. Decision-making details documented in ED  Course.  Risk OTC drugs. Prescription drug management. Decision regarding hospitalization.   Iv ns. Continuous pulse ox and cardiac monitoring. Labs ordered/sent. Imaging ordered.   Diff dx includes head injury, sdh, dehydration, etc - dispo decision including potential need for admission considered - will get labs and imaging and reassess.   Reviewed nursing notes and prior charts for additional history. External reports reviewed. Additional history from: EMS.   Cardiac monitor: sinus rhythm, rate 80.  Labs reviewed/interpreted by me - mild aki ?dehydration. Ns bolus.   CT reviewed/interpreted by me - no hem.  Wound cleaned, bacitracin and sterile dressing. Acetaminophen po. Po fluids/food. Ambulate in hall.  Pt tolerating po. No new c/o.  Discussed/offered admission. Patient/family request d/c to facility, pt does not want to be admitted.   Currently hr 78, pulse ox 97% room air, breathing comfortably, bp normal.   Pt currently appears stable for d/c.  Rec pcp f/u.  Return precautions provided.            Final Clinical Impression(s) / ED Diagnoses Final diagnoses:  None    Rx / DC Orders ED Discharge Orders     None  Lajean Saver, MD 04/24/22 [phone]

## 2022-04-24 NOTE — Discharge Instructions (Addendum)
It was our pleasure to provide your ER care today - we hope that you feel better.  Drink plenty of fluids/stay well hydrated.  Fall precautions.   Take antibiotics as prescribed.   Keep skin tear very clean - change dressing daily and as need.   Follow up with primary care doctor in the next 1-2 weeks.  Return to ER if worse, new symptoms, fevers, new or severe pain, chest pain, trouble breathing, abdominal pain, persistent vomiting, fainting, or other concern.

## 2022-04-25 ENCOUNTER — Emergency Department (HOSPITAL_COMMUNITY): Payer: Medicare Other

## 2022-04-25 ENCOUNTER — Other Ambulatory Visit: Payer: Self-pay

## 2022-04-25 ENCOUNTER — Inpatient Hospital Stay (HOSPITAL_COMMUNITY)
Admission: EM | Admit: 2022-04-25 | Discharge: 2022-05-28 | DRG: 177 | Disposition: E | Payer: Medicare Other | Attending: Internal Medicine | Admitting: Internal Medicine

## 2022-04-25 ENCOUNTER — Encounter (HOSPITAL_COMMUNITY): Payer: Self-pay | Admitting: *Deleted

## 2022-04-25 DIAGNOSIS — N179 Acute kidney failure, unspecified: Secondary | ICD-10-CM | POA: Diagnosis present

## 2022-04-25 DIAGNOSIS — S51011A Laceration without foreign body of right elbow, initial encounter: Secondary | ICD-10-CM | POA: Diagnosis present

## 2022-04-25 DIAGNOSIS — Z515 Encounter for palliative care: Secondary | ICD-10-CM

## 2022-04-25 DIAGNOSIS — I480 Paroxysmal atrial fibrillation: Secondary | ICD-10-CM | POA: Diagnosis present

## 2022-04-25 DIAGNOSIS — Z823 Family history of stroke: Secondary | ICD-10-CM

## 2022-04-25 DIAGNOSIS — D869 Sarcoidosis, unspecified: Secondary | ICD-10-CM | POA: Diagnosis present

## 2022-04-25 DIAGNOSIS — Z1152 Encounter for screening for COVID-19: Secondary | ICD-10-CM

## 2022-04-25 DIAGNOSIS — F0390 Unspecified dementia without behavioral disturbance: Secondary | ICD-10-CM | POA: Diagnosis present

## 2022-04-25 DIAGNOSIS — J449 Chronic obstructive pulmonary disease, unspecified: Secondary | ICD-10-CM | POA: Diagnosis not present

## 2022-04-25 DIAGNOSIS — Z833 Family history of diabetes mellitus: Secondary | ICD-10-CM

## 2022-04-25 DIAGNOSIS — Z7951 Long term (current) use of inhaled steroids: Secondary | ICD-10-CM

## 2022-04-25 DIAGNOSIS — J9601 Acute respiratory failure with hypoxia: Secondary | ICD-10-CM | POA: Insufficient documentation

## 2022-04-25 DIAGNOSIS — Z8249 Family history of ischemic heart disease and other diseases of the circulatory system: Secondary | ICD-10-CM

## 2022-04-25 DIAGNOSIS — Z79899 Other long term (current) drug therapy: Secondary | ICD-10-CM | POA: Diagnosis not present

## 2022-04-25 DIAGNOSIS — E039 Hypothyroidism, unspecified: Secondary | ICD-10-CM | POA: Diagnosis present

## 2022-04-25 DIAGNOSIS — Z882 Allergy status to sulfonamides status: Secondary | ICD-10-CM

## 2022-04-25 DIAGNOSIS — E785 Hyperlipidemia, unspecified: Secondary | ICD-10-CM | POA: Diagnosis present

## 2022-04-25 DIAGNOSIS — I2583 Coronary atherosclerosis due to lipid rich plaque: Secondary | ICD-10-CM | POA: Diagnosis not present

## 2022-04-25 DIAGNOSIS — J189 Pneumonia, unspecified organism: Secondary | ICD-10-CM | POA: Diagnosis present

## 2022-04-25 DIAGNOSIS — Z7902 Long term (current) use of antithrombotics/antiplatelets: Secondary | ICD-10-CM

## 2022-04-25 DIAGNOSIS — R54 Age-related physical debility: Secondary | ICD-10-CM | POA: Diagnosis present

## 2022-04-25 DIAGNOSIS — J440 Chronic obstructive pulmonary disease with acute lower respiratory infection: Secondary | ICD-10-CM | POA: Diagnosis present

## 2022-04-25 DIAGNOSIS — W1830XA Fall on same level, unspecified, initial encounter: Secondary | ICD-10-CM | POA: Diagnosis present

## 2022-04-25 DIAGNOSIS — Z66 Do not resuscitate: Secondary | ICD-10-CM | POA: Diagnosis present

## 2022-04-25 DIAGNOSIS — K219 Gastro-esophageal reflux disease without esophagitis: Secondary | ICD-10-CM | POA: Diagnosis present

## 2022-04-25 DIAGNOSIS — R0600 Dyspnea, unspecified: Principal | ICD-10-CM

## 2022-04-25 DIAGNOSIS — Z7982 Long term (current) use of aspirin: Secondary | ICD-10-CM | POA: Diagnosis not present

## 2022-04-25 DIAGNOSIS — Z87891 Personal history of nicotine dependence: Secondary | ICD-10-CM

## 2022-04-25 DIAGNOSIS — I10 Essential (primary) hypertension: Secondary | ICD-10-CM | POA: Diagnosis present

## 2022-04-25 DIAGNOSIS — J690 Pneumonitis due to inhalation of food and vomit: Principal | ICD-10-CM | POA: Diagnosis present

## 2022-04-25 DIAGNOSIS — Z7984 Long term (current) use of oral hypoglycemic drugs: Secondary | ICD-10-CM

## 2022-04-25 DIAGNOSIS — R7989 Other specified abnormal findings of blood chemistry: Secondary | ICD-10-CM | POA: Diagnosis present

## 2022-04-25 DIAGNOSIS — I2510 Atherosclerotic heart disease of native coronary artery without angina pectoris: Secondary | ICD-10-CM | POA: Diagnosis present

## 2022-04-25 DIAGNOSIS — Z955 Presence of coronary angioplasty implant and graft: Secondary | ICD-10-CM

## 2022-04-25 DIAGNOSIS — Z888 Allergy status to other drugs, medicaments and biological substances status: Secondary | ICD-10-CM

## 2022-04-25 DIAGNOSIS — E119 Type 2 diabetes mellitus without complications: Secondary | ICD-10-CM | POA: Diagnosis present

## 2022-04-25 DIAGNOSIS — R0602 Shortness of breath: Secondary | ICD-10-CM | POA: Diagnosis present

## 2022-04-25 DIAGNOSIS — Z79890 Hormone replacement therapy (postmenopausal): Secondary | ICD-10-CM

## 2022-04-25 LAB — GLUCOSE, CAPILLARY: Glucose-Capillary: 129 mg/dL — ABNORMAL HIGH (ref 70–99)

## 2022-04-25 LAB — RESPIRATORY PANEL BY PCR

## 2022-04-25 LAB — CBC WITH DIFFERENTIAL/PLATELET
Abs Immature Granulocytes: 0.16 10*3/uL — ABNORMAL HIGH (ref 0.00–0.07)
Basophils Absolute: 0 10*3/uL (ref 0.0–0.1)
Basophils Relative: 0 %
Eosinophils Absolute: 0 10*3/uL (ref 0.0–0.5)
Eosinophils Relative: 0 %
HCT: 39.8 % (ref 39.0–52.0)
Hemoglobin: 13.2 g/dL (ref 13.0–17.0)
Immature Granulocytes: 1 %
Lymphocytes Relative: 7 %
Lymphs Abs: 0.9 10*3/uL (ref 0.7–4.0)
MCH: 30.4 pg (ref 26.0–34.0)
MCHC: 33.2 g/dL (ref 30.0–36.0)
MCV: 91.7 fL (ref 80.0–100.0)
Monocytes Absolute: 1.2 10*3/uL — ABNORMAL HIGH (ref 0.1–1.0)
Monocytes Relative: 9 %
Neutro Abs: 11.2 10*3/uL — ABNORMAL HIGH (ref 1.7–7.7)
Neutrophils Relative %: 83 %
Platelets: 285 10*3/uL (ref 150–400)
RBC: 4.34 MIL/uL (ref 4.22–5.81)
RDW: 15 % (ref 11.5–15.5)
WBC: 13.4 10*3/uL — ABNORMAL HIGH (ref 4.0–10.5)
nRBC: 0 % (ref 0.0–0.2)

## 2022-04-25 LAB — COMPREHENSIVE METABOLIC PANEL
ALT: 17 U/L (ref 0–44)
AST: 30 U/L (ref 15–41)
Albumin: 3 g/dL — ABNORMAL LOW (ref 3.5–5.0)
Alkaline Phosphatase: 78 U/L (ref 38–126)
Anion gap: 13 (ref 5–15)
BUN: 41 mg/dL — ABNORMAL HIGH (ref 8–23)
CO2: 20 mmol/L — ABNORMAL LOW (ref 22–32)
Calcium: 8.8 mg/dL — ABNORMAL LOW (ref 8.9–10.3)
Chloride: 102 mmol/L (ref 98–111)
Creatinine, Ser: 1.76 mg/dL — ABNORMAL HIGH (ref 0.61–1.24)
GFR, Estimated: 36 mL/min — ABNORMAL LOW (ref >60)
Glucose, Bld: 169 mg/dL — ABNORMAL HIGH (ref 70–99)
Potassium: 4.4 mmol/L (ref 3.5–5.1)
Sodium: 135 mmol/L (ref 135–145)
Total Bilirubin: 1 mg/dL (ref 0.3–1.2)
Total Protein: 6.9 g/dL (ref 6.5–8.1)

## 2022-04-25 LAB — CBG MONITORING, ED: Glucose-Capillary: 141 mg/dL — ABNORMAL HIGH (ref 70–99)

## 2022-04-25 LAB — PROCALCITONIN: Procalcitonin: 4.17 ng/mL

## 2022-04-25 MED ORDER — SODIUM CHLORIDE 0.9 % IV SOLN
1.0000 g | Freq: Once | INTRAVENOUS | Status: AC
  Administered 2022-04-25: 1 g via INTRAVENOUS
  Filled 2022-04-25: qty 10

## 2022-04-25 MED ORDER — LORAZEPAM 0.5 MG PO TABS
0.2500 mg | ORAL_TABLET | Freq: Once | ORAL | Status: AC
  Administered 2022-04-25: 0.25 mg via ORAL
  Filled 2022-04-25: qty 1

## 2022-04-25 MED ORDER — SPIRONOLACTONE 12.5 MG HALF TABLET
12.5000 mg | ORAL_TABLET | Freq: Every day | ORAL | Status: DC
  Filled 2022-04-25 (×2): qty 1

## 2022-04-25 MED ORDER — PANTOPRAZOLE SODIUM 40 MG PO TBEC
40.0000 mg | DELAYED_RELEASE_TABLET | Freq: Every day | ORAL | Status: DC
  Administered 2022-04-26: 40 mg via ORAL
  Filled 2022-04-25: qty 1

## 2022-04-25 MED ORDER — ATORVASTATIN CALCIUM 10 MG PO TABS
10.0000 mg | ORAL_TABLET | Freq: Every day | ORAL | Status: DC
  Administered 2022-04-25 – 2022-04-26 (×2): 10 mg via ORAL
  Filled 2022-04-25 (×2): qty 1

## 2022-04-25 MED ORDER — ALBUTEROL SULFATE (2.5 MG/3ML) 0.083% IN NEBU: 2.5000 mg | INHALATION_SOLUTION | Freq: Four times a day (QID) | RESPIRATORY_TRACT | Status: DC | PRN

## 2022-04-25 MED ORDER — POLYETHYLENE GLYCOL 3350 17 G PO PACK: 17.0000 g | PACK | Freq: Every day | ORAL | Status: DC | PRN

## 2022-04-25 MED ORDER — LOPERAMIDE HCL 2 MG PO TABS: 2.0000 mg | ORAL_TABLET | Freq: Four times a day (QID) | ORAL | Status: DC | PRN

## 2022-04-25 MED ORDER — POLYVINYL ALCOHOL 1.4 % OP SOLN: 1.0000 [drp] | Freq: Three times a day (TID) | OPHTHALMIC | Status: DC | PRN

## 2022-04-25 MED ORDER — ENOXAPARIN SODIUM 30 MG/0.3ML IJ SOSY
30.0000 mg | PREFILLED_SYRINGE | INTRAMUSCULAR | Status: DC
  Administered 2022-04-25 – 2022-04-26 (×2): 30 mg via SUBCUTANEOUS
  Filled 2022-04-25 (×2): qty 0.3

## 2022-04-25 MED ORDER — MEMANTINE HCL 10 MG PO TABS
5.0000 mg | ORAL_TABLET | Freq: Every day | ORAL | Status: DC
  Administered 2022-04-26: 5 mg via ORAL

## 2022-04-25 MED ORDER — ASPIRIN 81 MG PO TBEC
81.0000 mg | DELAYED_RELEASE_TABLET | Freq: Every day | ORAL | Status: DC
  Administered 2022-04-25 – 2022-04-26 (×2): 81 mg via ORAL
  Filled 2022-04-25 (×2): qty 1

## 2022-04-25 MED ORDER — SALINE SPRAY 0.65 % NA SOLN: 1.0000 | NASAL | Status: DC | PRN

## 2022-04-25 MED ORDER — CLOPIDOGREL BISULFATE 75 MG PO TABS
75.0000 mg | ORAL_TABLET | ORAL | Status: DC
  Administered 2022-04-26: 75 mg via ORAL
  Filled 2022-04-25: qty 1

## 2022-04-25 MED ORDER — SODIUM CHLORIDE 0.9% FLUSH
3.0000 mL | Freq: Two times a day (BID) | INTRAVENOUS | Status: DC
  Administered 2022-04-25 – 2022-04-26 (×4): 3 mL via INTRAVENOUS

## 2022-04-25 MED ORDER — CITALOPRAM HYDROBROMIDE 20 MG PO TABS
10.0000 mg | ORAL_TABLET | ORAL | Status: DC
  Administered 2022-04-26: 10 mg via ORAL
  Filled 2022-04-25: qty 1

## 2022-04-25 MED ORDER — LEVOTHYROXINE SODIUM 75 MCG PO TABS
75.0000 ug | ORAL_TABLET | ORAL | Status: DC
  Administered 2022-04-26: 75 ug via ORAL
  Filled 2022-04-25: qty 1

## 2022-04-25 MED ORDER — SODIUM CHLORIDE 0.9 % IV SOLN
2.0000 g | INTRAVENOUS | Status: DC
  Administered 2022-04-26 – 2022-04-27 (×2): 2 g via INTRAVENOUS
  Filled 2022-04-25 (×2): qty 20

## 2022-04-25 MED ORDER — INSULIN ASPART 100 UNIT/ML IJ SOLN
0.0000 [IU] | Freq: Three times a day (TID) | INTRAMUSCULAR | Status: DC
  Administered 2022-04-27: 2 [IU] via SUBCUTANEOUS

## 2022-04-25 MED ORDER — LORAZEPAM 0.5 MG PO TABS
0.5000 mg | ORAL_TABLET | Freq: Every day | ORAL | Status: DC | PRN
  Administered 2022-04-25: 0.5 mg via ORAL
  Filled 2022-04-25: qty 1

## 2022-04-25 MED ORDER — SODIUM CHLORIDE 0.9 % IV SOLN
500.0000 mg | Freq: Once | INTRAVENOUS | Status: AC
  Administered 2022-04-25: 500 mg via INTRAVENOUS
  Filled 2022-04-25: qty 5

## 2022-04-25 MED ORDER — ACETAMINOPHEN 650 MG RE SUPP: 650.0000 mg | Freq: Four times a day (QID) | RECTAL | Status: DC | PRN

## 2022-04-25 MED ORDER — GABAPENTIN 300 MG PO CAPS
300.0000 mg | ORAL_CAPSULE | Freq: Every day | ORAL | Status: DC
  Administered 2022-04-25: 300 mg via ORAL
  Filled 2022-04-25: qty 1

## 2022-04-25 MED ORDER — ACETAMINOPHEN 325 MG PO TABS: 650.0000 mg | ORAL_TABLET | Freq: Four times a day (QID) | ORAL | Status: DC | PRN

## 2022-04-25 MED ORDER — ORAL CARE MOUTH RINSE: 15.0000 mL | OROMUCOSAL | Status: DC | PRN

## 2022-04-25 MED ORDER — LOSARTAN POTASSIUM 50 MG PO TABS
100.0000 mg | ORAL_TABLET | Freq: Every day | ORAL | Status: DC
  Administered 2022-04-26: 100 mg via ORAL
  Filled 2022-04-25: qty 2

## 2022-04-25 MED ORDER — ALBUTEROL SULFATE HFA 108 (90 BASE) MCG/ACT IN AERS: 2.0000 | INHALATION_SPRAY | RESPIRATORY_TRACT | Status: DC | PRN

## 2022-04-25 MED ORDER — SODIUM CHLORIDE 0.9 % IV SOLN: Freq: Once | INTRAVENOUS | Status: AC

## 2022-04-25 MED ORDER — ASPIRIN 81 MG PO TABS: 81.0000 mg | ORAL_TABLET | ORAL | Status: DC

## 2022-04-25 MED ORDER — SODIUM CHLORIDE 0.9 % IV SOLN
500.0000 mg | INTRAVENOUS | Status: DC
  Administered 2022-04-26: 500 mg via INTRAVENOUS
  Filled 2022-04-25 (×2): qty 5

## 2022-04-25 MED ORDER — SIMETHICONE 80 MG PO CHEW: 80.0000 mg | CHEWABLE_TABLET | Freq: Four times a day (QID) | ORAL | Status: DC | PRN

## 2022-04-25 MED ORDER — VERAPAMIL HCL 40 MG PO TABS
40.0000 mg | ORAL_TABLET | Freq: Two times a day (BID) | ORAL | Status: DC
  Administered 2022-04-25 – 2022-04-26 (×2): 40 mg via ORAL
  Filled 2022-04-25 (×4): qty 1

## 2022-04-25 MED ORDER — UMECLIDINIUM-VILANTEROL 62.5-25 MCG/ACT IN AEPB
1.0000 | INHALATION_SPRAY | Freq: Every day | RESPIRATORY_TRACT | Status: DC
  Filled 2022-04-25: qty 14

## 2022-04-25 MED ORDER — PANCRELIPASE (LIP-PROT-AMYL) 36000-114000 UNITS PO CPEP
36000.0000 [IU] | ORAL_CAPSULE | Freq: Three times a day (TID) | ORAL | Status: DC
  Administered 2022-04-25 – 2022-04-26 (×2): 36000 [IU] via ORAL
  Filled 2022-04-25 (×3): qty 1

## 2022-04-25 MED ORDER — FAMOTIDINE 20 MG PO TABS
10.0000 mg | ORAL_TABLET | Freq: Every day | ORAL | Status: DC
  Administered 2022-04-25 – 2022-04-26 (×2): 10 mg via ORAL
  Filled 2022-04-25 (×2): qty 1

## 2022-04-25 NOTE — ED Notes (Signed)
Daughter Christie Copley [phone] would like an update asap and when he's going to be moved upstairs to a room

## 2022-04-25 NOTE — ED Provider Notes (Signed)
[facility] EMERGENCY DEPARTMENT Provider Note   CSN: [id number] Arrival date & time: 04/06/2022  1026     History  Chief Complaint  Patient presents with   Shortness of Breath    Benjamin Mccall is a 88 y.o. male.  88 year old male with prior medical history as detailed below presents from his assisted living facility for reevaluation.  Patient was seen yesterday for multiple complaints.  Patient was diagnosed with likely pneumonia.  Patient and patient's family declined admission yesterday.  Overnight staff at patient's facility noticed that he had mild hypoxia down into the 80s.  Patient is not on O2 at baseline.  On arrival the patient's O2 sats on room air are roughly 90%.  Patient reports that he feels comfortable.  The history is provided by the patient, the EMS personnel and medical records.       Home Medications Prior to Admission medications   Medication Sig Start Date End Date Taking? Authorizing Provider  acetaminophen (TYLENOL) 500 MG tablet Take 1,000 mg by mouth every 8 (eight) hours as needed for moderate pain.    [provider]  albuterol (PROVENTIL HFA;VENTOLIN HFA) 108 (90 BASE) MCG/ACT inhaler Inhale 2 puffs into the lungs every 4 (four) hours as needed for wheezing or shortness of breath.    [provider]  amoxicillin (AMOXIL) 500 MG capsule Take 2,000 mg by mouth as directed. Take 4 capsules by mouth 1 hour before dental appointment    [provider]  aspirin 81 MG tablet Take 81 mg by mouth every morning.     [provider]  atorvastatin (LIPITOR) 10 MG tablet Take 1 tablet (10 mg total) by mouth daily. 09/02/17   Dragnev, Caesar Chestnut, NP  azithromycin (ZITHROMAX Z-PAK) 250 MG tablet Take 1 tablet (250 mg total) by mouth daily for 4 days. 04/06/2022 04/29/22  Lajean Saver, MD  cefdinir (OMNICEF) 300 MG capsule Take 1 capsule (300 mg total) by mouth 2 (two) times daily. 04/24/2022   Lajean Saver, MD   Cholecalciferol (VITAMIN D3) 1000 UNITS CAPS Take 1,000 Units by mouth every morning.     [provider]  citalopram (CELEXA) 10 MG tablet Take 10 mg by mouth every morning.     [provider]  clopidogrel (PLAVIX) 75 MG tablet Take 75 mg by mouth every morning.     [provider]  CREON 12000 units CPEP capsule Take 36,000 mg by mouth 3 (three) times daily.  11/19/15   [provider]  famotidine (PEPCID) 20 MG tablet Take 1 tablet (20 mg total) by mouth 2 (two) times daily. 07/01/18   Margot Ables, NP  ferrous sulfate 325 (65 FE) MG tablet Take 325 mg by mouth every Monday, Wednesday, and Friday.    [provider]  gabapentin (NEURONTIN) 300 MG capsule Take 300 mg by mouth at bedtime.    [provider]  guaiFENesin (GERI-TUSSIN) 100 MG/5ML liquid Take 10 mLs by mouth every 4 (four) hours as needed for cough.    [provider]  guaiFENesin (MUCINEX) 600 MG 12 hr tablet Take 600 mg by mouth every 12 (twelve) hours as needed (congestion).    [provider]  hydrocortisone cream 1 % Apply 1 application  topically 4 (four) times daily as needed for itching.    [provider]  levothyroxine (SYNTHROID, LEVOTHROID) 75 MCG tablet Take 1 tablet (75 mcg total) by mouth every morning. 10/27/17   Marrian Salvage, [place]  loperamide (ANTI-DIARRHEAL) 2 MG tablet Take 2 mg by mouth 4 (four) times daily as needed for diarrhea or loose stools.    [provider]  LORazepam (ATIVAN) 0.5 MG tablet Take 0.5 mg by mouth daily as needed for anxiety (agitation).    [provider]  losartan (COZAAR) 100 MG tablet Take 100 mg by mouth daily.    [provider]  memantine (NAMENDA) 5 MG tablet Take 5 mg by mouth daily.    [provider]  metFORMIN (GLUCOPHAGE) 500 MG tablet Take 500 mg by mouth daily.     [provider]  Multiple Vitamin (TAB-A-VITE PO) Take 1 tablet by  mouth daily.    [provider]  Neomycin-Bacitracin-Polymyxin (TRIPLE ANTIBIOTIC) OINT Apply 1 Application topically daily as needed (skin tear).    [provider]  nitroGLYCERIN (NITRODUR - DOSED IN MG/24 HR) 0.1 mg/hr patch Place 0.1 mg onto the skin daily. Apply patch every morning and remove at bedtime Patient not taking: Reported on 04/24/2022    [provider]  nitroGLYCERIN (NITROSTAT) 0.4 MG SL tablet Place 0.4 mg under the tongue every 5 (five) minutes as needed for chest pain.    [provider]  omeprazole (PRILOSEC) 20 MG capsule Take 20 mg by mouth daily.  04/07/17   [provider]  oxyCODONE (ROXICODONE) 5 MG immediate release tablet Take 1 tablet (5 mg total) by mouth every 6 (six) hours as needed for severe pain. 02/10/22   Fransico Meadow, MD  polyethylene glycol powder (GLYCOLAX/MIRALAX) powder Take 17 g by mouth daily.     [provider]  polyvinyl alcohol (ARTIFICIAL TEARS) 1.4 % ophthalmic solution Place 1 drop into both eyes 3 (three) times daily as needed for dry eyes.    [provider]  simethicone (GAS-X EXTRA STRENGTH) 125 MG chewable tablet Chew 1-2 tablets (125-250 mg total) by mouth 3 (three) times daily with meals. Max 4 tablets daily Patient taking differently: Chew 125 mg by mouth 4 (four) times daily as needed (bloating). 02/18/15   [place], FNP  sodium chloride (OCEAN) 0.65 % SOLN nasal spray Place 1 spray into both nostrils as needed for congestion.    [provider]  sodium fluoride (DENTAGEL) 1.1 % GEL dental gel Place 1 application onto teeth 3 (three) times daily.    [provider]  spironolactone (ALDACTONE) 25 MG tablet Take 12.5 mg by mouth daily.    [provider]  umeclidinium-vilanterol (ANORO ELLIPTA) 62.5-25 MCG/ACT AEPB Inhale 1 puff into the lungs daily.    [provider]  verapamil (CALAN) 40 MG tablet Take 40 mg by mouth 2 (two)  times daily.    [provider]      Allergies    Reglan [metoclopramide] and Sulfa antibiotics    Review of Systems   Review of Systems  All other systems reviewed and are negative.   Physical Exam Updated Vital Signs There were no vitals taken for this visit. Physical Exam Vitals and nursing note reviewed.  Constitutional:      General: He is not in acute distress.    Appearance: Normal appearance. He is well-developed.  HENT:     Head: Normocephalic and atraumatic.  Eyes:     Conjunctiva/sclera: Conjunctivae normal.     Pupils: Pupils are equal, round, and reactive to light.  Cardiovascular:     Rate and Rhythm: Normal rate and regular rhythm.     Heart sounds: Normal heart sounds.  Pulmonary:     Effort: Pulmonary effort is normal. No respiratory distress.     Breath sounds: Examination of the right-lower field reveals decreased breath sounds. Examination of the left-lower field reveals decreased breath sounds. Decreased breath sounds present.  Abdominal:     General: There is no distension.     Palpations: Abdomen is soft.     Tenderness: There is no abdominal tenderness.  Musculoskeletal:        General: No deformity. Normal range of motion.     Cervical back: Normal range of motion and neck supple.  Skin:    General: Skin is warm and dry.  Neurological:     General: No focal deficit present.     Mental Status: He is alert and oriented to person, place, and time.     ED Results / Procedures / Treatments   Labs (all labs ordered are listed, but only abnormal results are displayed) Labs Reviewed  CULTURE, BLOOD (ROUTINE X 2)  CULTURE, BLOOD (ROUTINE X 2)  CBC WITH DIFFERENTIAL/PLATELET  COMPREHENSIVE METABOLIC PANEL    EKG None  Radiology DG Chest Port 1 View  Result Date: 04/24/2022 CLINICAL DATA:  Concern for pneumonia EXAM: PORTABLE CHEST 1 VIEW COMPARISON:  Multiple prior chest radiographs FINDINGS: Mild diffuse interstitial and patchy  reticulonodular airspace opacities with most prominent in the left lung base. No large effusion or evidence of pneumothorax. No acute osseous abnormality. Thoracic spondylosis. Colonic interposition under the right hemidiaphragm. IMPRESSION: Interstitial and patchy airspace opacities bilaterally, most prominent in the left lung base, concerning for an infectious/inflammatory process. Electronically Signed   By: Caprice Renshaw M.D.   On: 04/24/2022 10:44   CT Head Wo Contrast  Result Date: 04/24/2022 CLINICAL DATA:  Head trauma, minor (Age >= 65y) EXAM: CT HEAD WITHOUT CONTRAST TECHNIQUE: Contiguous axial images were obtained from the base of the skull through the vertex without intravenous contrast. RADIATION DOSE REDUCTION: This exam was performed according to the departmental dose-optimization program which includes automated exposure control, adjustment of the mA and/or kV according to patient size and/or use of iterative reconstruction technique. COMPARISON:  CT head October 17, 23 FINDINGS: Brain: No evidence of acute infarction, hemorrhage, hydrocephalus, extra-axial collection or mass lesion/mass effect. Chronic microvascular ischemic disease. Cerebral atrophy. Vascular: No hyperdense vessel.  Calcific atherosclerosis. Skull: No acute fracture. Sinuses/Orbits: Clear sinuses.  No acute orbital findings. Other: No mastoid effusions. IMPRESSION: No evidence of acute intracranial abnormality. Electronically Signed   By: Feliberto Harts M.D.   On: 04/24/2022 08:44    Procedures Procedures    Medications Ordered in ED Medications  cefTRIAXone (ROCEPHIN) 1 g in sodium chloride 0.9 % 100 mL IVPB (has no administration in time range)  azithromycin (ZITHROMAX) 500 mg in sodium chloride 0.9 % 250 mL IVPB (has no administration in time range)  0.9 %  sodium chloride infusion (has no administration in time range)    ED Course/ Medical Decision Making/ A&P                           Medical Decision  Making Amount and/or Complexity of Data Reviewed Labs: ordered. Radiology: ordered.  Risk Prescription drug management. Decision regarding hospitalization.    Medical Screen Complete  This patient presented to the ED with complaint of pneumonia, hypoxia, dehydration.  This complaint involves an extensive number of treatment options. The initial differential diagnosis includes, but is not limited to, metabolic abnormality, pneumonia, etc.  This  presentation is: Acute, Chronic, Self-Limited, Previously Undiagnosed, Uncertain Prognosis, Complicated, Systemic Symptoms, and Threat to Life/Bodily Function  Patient seen yesterday and diagnosed with likely pneumonia and concurrent dehydration.  Patient was discharged to assisted living.  Patient did not do well overnight with reported hypoxia.  Patient returns to the ED.  He would benefit from admission.  Hospital service made aware of case and will evaluate for same.  Additional history obtained: External records from outside sources obtained and reviewed including prior ED visits and prior Inpatient records.    Lab Tests:  I ordered and personally interpreted labs.  The pertinent results include: CBC, CMP   Imaging Studies ordered:  I ordered imaging studies including chest x-ray I independently visualized and interpreted obtained imaging which showed likely bibasilar infiltrates I agree with the radiologist interpretation.   Cardiac Monitoring:  The patient was maintained on a cardiac monitor.  I personally viewed and interpreted the cardiac monitor which showed an underlying rhythm of: NSR   Medicines ordered:  I ordered medication including antibiotics for suspected infection Reevaluation of the patient after these medicines showed that the patient: stayed the same    Problem List / ED Course:  Suspected pneumonia, dehydration   Reevaluation:  After the interventions noted above, I reevaluated the patient  and found that they have: stayed the same  Disposition:  After consideration of the diagnostic results and the patients response to treatment, I feel that the patent would benefit from Admission.        Final Clinical Impression(s) / ED Diagnoses Final diagnoses:  Dyspnea, unspecified type    Rx / DC Orders ED Discharge Orders     None         Valarie Merino, MD 04/11/2022 [phone]

## 2022-04-25 NOTE — ED Notes (Signed)
Attempted to assist pt up with walker to urinate.  Pt became panicked and stated that he could not breath.  Sats dipped to 89% on 2L.  Pt repositioned in bed and O2 increased briefly to 4L until pt sats increased to 96%.

## 2022-04-25 NOTE — H&P (Addendum)
History and Physical   Benjamin Mccall LFY:[id number] DOB: 11-Feb-1930 DOA: 04/01/2022  PCP: Benjamin Agee, NP   Patient coming from: Home  Chief Complaint: Shortness of breath  HPI: Benjamin Mccall is a 88 y.o. male with medical history significant of Sinus bradycardia, sarcoidosis, atrial fibrillation, hypothyroidism, hyperlipidemia, GERD, CAD, hypertension, diabetes, COPD presenting with shortness of breath.  Patient was seen yesterday after a fall and generalized weakness.  No trauma but there was evidence of AKI and possible pneumonia on chest x-ray at that time.  Was offered admission but declined and was discharged back to facility.  Prescription for azithromycin and cefdinir written.  While at facility overnight staff noted him desaturating to the 80s and he has no oxygen requirement at baseline.  Sent back to the ED today for reevaluation.    He denies fevers, chills, chest pain, abdominal pain, constipation, diarrhea, nausea, vomiting.  Vital signs in the ED significant for blood pressure in the 100s to 130s systolic, respiratory rate in the teens to 20s,  ED Course: Initially 90% on room air had been desaturating to the 80s including 89% on 2 L with improvement to the 90s on 4 L.  Lab workup included CMP with bicarb 20, BUN 41, creatinine elevated to 1.76 from 1.4, glucose 169, calcium 8.8, albumin 3.0.  CBC with leukocytosis to 13.4 down from 17 yesterday.  Yesterday Rester panel for flu COVID RSV was negative.  Yesterday urinalysis showed protein and rare bacteria only.  Yesterday blood cultures are negative so far, blood cultures today are pending.  Chest x-ray showed basilar opacity possibly representing pneumonia with interstitial prominence representing edema versus atypical infection.  CT head yesterday showed no acute abnormality.  Patient received ceftriaxone, azithromycin, IV fluids in the ED.  Review of Systems: As per HPI otherwise all other systems reviewed and are  negative.  Past Medical History:  Diagnosis Date   Abdominal distention 02/18/2015   Acute right-sided thoracic back pain 08/17/2016   Asthma    Atrial fibrillation (HCC)    Chest pain    Controlled type 2 diabetes mellitus (HCC) 03/13/2014   Diabetes mellitus without complication (HCC)    GERD (gastroesophageal reflux disease)    Hernia, hiatal    Hyperlipidemia    Hypertension    Hypothyroid    Hypothyroidism 03/13/2014   Congenital.    Rash and nonspecific skin eruption 04/03/2014   Sarcoidosis 03/13/2014   Followed in Pulmonary clinic/ [facility]/ [place] - dx in 1980's by surgical bx of lymph node in neck and rx x 10 years with prednisone for cough/sob - 03/27/2014  Walked RA x 3 laps @ 185 ft each stopped due to  End of study, no sob or desat @ slow pace - PFT's 06/29/14  VC 65%  DLCO 65 corrects to 99%     Past Surgical History:  Procedure Laterality Date   APPENDECTOMY     CATARACT EXTRACTION     HIATAL HERNIA REPAIR     PERCUTANEOUS CORONARY STENT INTERVENTION (PCI-S)      Social History  reports that he quit smoking about 54 years ago. His smoking use included pipe and cigars. He has a 3.50 pack-year smoking history. He has never used smokeless tobacco. He reports that he does not drink alcohol and does not use drugs.  Allergies  Allergen Reactions   Reglan [Metoclopramide]     Tremors    Sulfa Antibiotics Other (See Comments)    shakes    Family History  Problem Relation Age of Onset   Hypertension Mother    Stroke Mother    Ovarian cancer Mother    Stroke Father    Diabetes Son   Reviewed on admission  Prior to Admission medications   Medication Sig Start Date End Date Taking? Authorizing Provider  acetaminophen (TYLENOL) 500 MG tablet Take 1,000 mg by mouth every 8 (eight) hours as needed for moderate pain.   Yes [provider]  albuterol (PROVENTIL HFA;VENTOLIN HFA) 108 (90 BASE) MCG/ACT inhaler Inhale 2 puffs into the lungs every 4  (four) hours as needed for wheezing or shortness of breath.   Yes [provider]  amoxicillin (AMOXIL) 500 MG capsule Take 2,000 mg by mouth as directed. Take 4 capsules by mouth 1 hour before dental appointment   Yes [provider]  aspirin 81 MG tablet Take 81 mg by mouth every morning.    Yes [provider]  atorvastatin (LIPITOR) 10 MG tablet Take 1 tablet (10 mg total) by mouth daily. 09/02/17  Yes Dragnev, Caesar Chestnut, NP  Cholecalciferol (VITAMIN D3) 1000 UNITS CAPS Take 1,000 Units by mouth every morning.    Yes [provider]  citalopram (CELEXA) 10 MG tablet Take 10 mg by mouth every morning.    Yes [provider]  clopidogrel (PLAVIX) 75 MG tablet Take 75 mg by mouth every morning.    Yes [provider]  CREON 12000 units CPEP capsule Take 36,000 mg by mouth 3 (three) times daily.  11/19/15  Yes [provider]  famotidine (PEPCID) 20 MG tablet Take 1 tablet (20 mg total) by mouth 2 (two) times daily. 07/01/18  Yes Margot Ables, NP  ferrous sulfate 325 (65 FE) MG tablet Take 325 mg by mouth every Monday, Wednesday, and Friday.   Yes [provider]  gabapentin (NEURONTIN) 300 MG capsule Take 300 mg by mouth at bedtime.   Yes [provider]  guaiFENesin (GERI-TUSSIN) 100 MG/5ML liquid Take 10 mLs by mouth every 4 (four) hours as needed for cough.   Yes [provider]  guaiFENesin (MUCINEX) 600 MG 12 hr tablet Take 600 mg by mouth every 12 (twelve) hours as needed (congestion).   Yes [provider]  levothyroxine (SYNTHROID, LEVOTHROID) 75 MCG tablet Take 1 tablet (75 mcg total) by mouth every morning. 10/27/17  Yes Marrian Salvage, FNP  loperamide (ANTI-DIARRHEAL) 2 MG tablet Take 2 mg by mouth 4 (four) times daily as needed for diarrhea or loose stools.   Yes [provider]  LORazepam (ATIVAN) 0.5 MG tablet Take 0.5 mg by mouth daily as needed for anxiety  (agitation).   Yes [provider]  losartan (COZAAR) 100 MG tablet Take 100 mg by mouth daily.   Yes [provider]  memantine (NAMENDA) 5 MG tablet Take 5 mg by mouth daily.   Yes [provider]  metFORMIN (GLUCOPHAGE) 500 MG tablet Take 500 mg by mouth daily.    Yes [provider]  Multiple Vitamin (TAB-A-VITE PO) Take 1 tablet by mouth daily.   Yes [provider]  nitroGLYCERIN (NITROSTAT) 0.4 MG SL tablet Place 0.4 mg under the tongue every 5 (five) minutes as needed for chest pain.   Yes [provider]  omeprazole (PRILOSEC) 20 MG capsule Take 20 mg by mouth daily.  04/07/17  Yes [provider]  oxyCODONE (ROXICODONE) 5 MG immediate release tablet Take 1 tablet (5 mg total) by mouth every 6 (six) hours as  needed for severe pain. 02/10/22  Yes Fransico Meadow, MD  polyethylene glycol powder (GLYCOLAX/MIRALAX) powder Take 17 g by mouth daily as needed for mild constipation or moderate constipation.   Yes [provider]  polyvinyl alcohol (ARTIFICIAL TEARS) 1.4 % ophthalmic solution Place 1 drop into both eyes 3 (three) times daily as needed for dry eyes.   Yes [provider]  simethicone (GAS-X EXTRA STRENGTH) 125 MG chewable tablet Chew 1-2 tablets (125-250 mg total) by mouth 3 (three) times daily with meals. Max 4 tablets daily Patient taking differently: Chew 125 mg by mouth 4 (four) times daily as needed (bloating). 02/18/15  Yes Golden Circle, FNP  sodium chloride (OCEAN) 0.65 % SOLN nasal spray Place 1 spray into both nostrils as needed for congestion.   Yes [provider]  sodium fluoride (DENTAGEL) 1.1 % GEL dental gel Place 1 application onto teeth 3 (three) times daily.   Yes [provider]  spironolactone (ALDACTONE) 25 MG tablet Take 12.5 mg by mouth daily.   Yes [provider]  umeclidinium-vilanterol (ANORO ELLIPTA) 62.5-25 MCG/ACT AEPB Inhale 1 puff into the  lungs daily.   Yes [provider]  verapamil (CALAN) 40 MG tablet Take 40 mg by mouth 2 (two) times daily.   Yes [provider]    Physical Exam: Vitals:   04/08/2022 1043 03/29/2022 1047  BP: 100/80   Pulse: 77   Resp: 18   Temp: 98.8 F (37.1 C)   TempSrc: Oral   SpO2: 92%   Weight:  70 kg  Height:  5\' 9"  (1.753 m)    Physical Exam Constitutional:      General: He is not in acute distress.    Appearance: Normal appearance.     Comments: Mildly confused elderly male  HENT:     Head: Normocephalic and atraumatic.     Mouth/Throat:     Mouth: Mucous membranes are moist.     Pharynx: Oropharynx is clear.  Eyes:     Extraocular Movements: Extraocular movements intact.     Pupils: Pupils are equal, round, and reactive to light.  Cardiovascular:     Rate and Rhythm: Normal rate and regular rhythm.     Pulses: Normal pulses.     Heart sounds: Normal heart sounds.  Pulmonary:     Effort: Pulmonary effort is normal. No respiratory distress.     Breath sounds: Normal breath sounds.  Abdominal:     General: Bowel sounds are normal. There is no distension.     Palpations: Abdomen is soft.     Tenderness: There is no abdominal tenderness.  Musculoskeletal:        General: No swelling or deformity.  Skin:    General: Skin is warm and dry.  Neurological:     General: No focal deficit present.     Mental Status: Mental status is at baseline.    Labs on Admission: I have personally reviewed following labs and imaging studies  CBC: Recent Labs  Lab 04/24/22 0715 04/26/2022 1035  WBC 17.3* 13.4*  NEUTROABS  --  11.2*  HGB 12.4* 13.2  HCT 37.3* 39.8  MCV 92.3 91.7  PLT 291 [id number]    Basic Metabolic Panel: Recent Labs  Lab 04/24/22 0715 04/13/2022 1035  NA 133* 135  K 4.5 4.4  CL 102 102  CO2 21* 20*  GLUCOSE 150* 169*  BUN 37* 41*  CREATININE 2.02* 1.76*  CALCIUM 8.5* 8.8*    GFR: Estimated  Creatinine Clearance: 27.6 mL/min (A) (by C-G formula  based on SCr of 1.76 mg/dL (H)).  Liver Function Tests: Recent Labs  Lab 04/20/2022 1035  AST 30  ALT 17  ALKPHOS 78  BILITOT 1.0  PROT 6.9  ALBUMIN 3.0*    Urine analysis:    Component Value Date/Time   COLORURINE AMBER (A) 04/24/2022 1145   APPEARANCEUR HAZY (A) 04/24/2022 1145   LABSPEC 1.018 04/24/2022 1145   PHURINE 5.0 04/24/2022 1145   GLUCOSEU NEGATIVE 04/24/2022 1145   GLUCOSEU NEGATIVE 09/02/2017 1418   HGBUR NEGATIVE 04/24/2022 1145   [place] 04/24/2022 1145   KETONESUR NEGATIVE 04/24/2022 1145   PROTEINUR 30 (A) 04/24/2022 1145   UROBILINOGEN 2.0 (A) 09/02/2017 1418   NITRITE NEGATIVE 04/24/2022 1145   LEUKOCYTESUR NEGATIVE 04/24/2022 1145    Radiological Exams on Admission: DG Chest Port 1 View  Result Date: 04/06/2022 CLINICAL DATA:  Shortness of breath EXAM: PORTABLE CHEST 1 VIEW COMPARISON:  April 24, 2022 FINDINGS: Air under the right hemidiaphragm is consistent with a loop of colon interposed between liver and diaphragm. The finding is stable. Right hemidiaphragm is elevated. Bibasilar opacities are identified, new on the right and similar on the left in the interval. No pneumothorax. Interstitial prominence. The cardiomediastinal silhouette is stable. IMPRESSION: Bibasilar opacities, new on the right and similar on the left, could represent atelectasis or infiltrate. Pneumonia not excluded. Interstitial prominence concerning for mild edema versus atypical infection. No other abnormalities. Electronically Signed   By: Dorise Bullion III M.D.   On: 04/20/2022 10:58   DG Chest Port 1 View  Result Date: 04/24/2022 CLINICAL DATA:  Concern for pneumonia EXAM: PORTABLE CHEST 1 VIEW COMPARISON:  Multiple prior chest radiographs FINDINGS: Mild diffuse interstitial and patchy reticulonodular airspace opacities with most prominent in the left lung base. No large effusion or evidence of pneumothorax. No acute osseous abnormality. Thoracic spondylosis.  Colonic interposition under the right hemidiaphragm. IMPRESSION: Interstitial and patchy airspace opacities bilaterally, most prominent in the left lung base, concerning for an infectious/inflammatory process. Electronically Signed   By: Maurine Simmering M.D.   On: 04/24/2022 10:44   CT Head Wo Contrast  Result Date: 04/24/2022 CLINICAL DATA:  Head trauma, minor (Age >= 65y) EXAM: CT HEAD WITHOUT CONTRAST TECHNIQUE: Contiguous axial images were obtained from the base of the skull through the vertex without intravenous contrast. RADIATION DOSE REDUCTION: This exam was performed according to the departmental dose-optimization program which includes automated exposure control, adjustment of the mA and/or kV according to patient size and/or use of iterative reconstruction technique. COMPARISON:  CT head October 17, 23 FINDINGS: Brain: No evidence of acute infarction, hemorrhage, hydrocephalus, extra-axial collection or mass lesion/mass effect. Chronic microvascular ischemic disease. Cerebral atrophy. Vascular: No hyperdense vessel.  Calcific atherosclerosis. Skull: No acute fracture. Sinuses/Orbits: Clear sinuses.  No acute orbital findings. Other: No mastoid effusions. IMPRESSION: No evidence of acute intracranial abnormality. Electronically Signed   By: Margaretha Sheffield M.D.   On: 04/24/2022 08:44    EKG: Independently reviewed.  Sinus rhythm at 72 bpm.  PAC noted.  First-degree AV block.  Evidence of borderline LVH.  Assessment/Plan Principal Problem:   CAP (community acquired pneumonia) Active Problems:   Sarcoidosis   Hyperlipidemia   Coronary artery disease   Hypothyroidism   Essential hypertension   COPD GOLD II    GERD (gastroesophageal reflux disease)   Diabetes mellitus without complication (HCC)   PAF (paroxysmal atrial fibrillation) ([place])   [facility] pneumonia Acute hypoxic respiratory  failure > Patient presenting with shortness of breath and found to have hypoxia at facility  overnight. > Was seen yesterday with concern for developing pneumonia and declined admission but was prescribed azithromycin and cefdinir. > With his persistent and worsening symptoms and hypoxia patient be admitted for inpatient treatment. > Hypoxic to the 80s on room air and 89% on 2 L with ambulation.  Saturating well at rest and on ambulation on 4 L. > Chest x-ray with evidence of pneumonia and questionable atypical infection versus edema.  Otherwise patient appears volume down.  Leukocytosis on CBC. - Monitor on telemetry - Continue with ceftriaxone and azithromycin - Continue supplemental oxygen, wean as tolerated - Trend fever curve and WBC - Follow-up blood cultures - Given atypical appearance we will also check procalcitonin and RVP to evaluate for possible viral involvement, does have downtrending leukocytosis from yesterday.  CAD - Continue home aspirin and Plavix - Continue home atorvastatin - Continue home losartan  Hyperlipidemia - Continue home atorvastatin  Hypertension - Continue home losartan, spironolactone, verapamil  Paroxysmal atrial fibrillation > Currently in sinus - Not on any antiarrhythmic nor rate control medications - Not on any anticoagulants  Hypothyroidism - Continue home Synthroid  GERD - Continue home PPI and Pepcid  Diabetes - SSI  COPD - Continue home Anoro and as needed albuterol  Dementia - Continue home Namenda, Celexa, as needed Ativan  History of sarcoidosis - Noted  DVT prophylaxis: Lovenox Code Status:   DNR/DNI, confirmed on admission Family Communication:  Updated at bedside  Disposition Plan:   Patient is from:  Assisted living  Anticipated DC to:  Same as above  Anticipated DC date:  2 to 3 days  Anticipated DC barriers: None  Consults called:  None Admission status:  Inpatient, telemetry  Severity of Illness: The appropriate patient status for this patient is INPATIENT. Inpatient status is judged to be  reasonable and necessary in order to provide the required intensity of service to ensure the patient's safety. The patient's presenting symptoms, physical exam findings, and initial radiographic and laboratory data in the context of their chronic comorbidities is felt to place them at high risk for further clinical deterioration. Furthermore, it is not anticipated that the patient will be medically stable for discharge from the hospital within 2 midnights of admission.   * I certify that at the point of admission it is my clinical judgment that the patient will require inpatient hospital care spanning beyond 2 midnights from the point of admission due to high intensity of service, high risk for further deterioration and high frequency of surveillance required.Marcelyn Bruins MD [facility]  How to contact the [facility] Attending or Consulting provider [place] or covering provider during after hours [place], for this patient?   Check the care team in [facility] and look for a) attending/consulting TR[facility] provider listed and b) the [facility] team listed Log into [email or web address] and use [place]'s universal password to access. If you do not have the password, please contact the hospital operator. Locate the [facility] provider you are looking for under [facility] and page to a number that you can be directly reached. If you still have difficulty reaching the provider, please page the [facility] (Director on Call) for the Hospitalists listed on amion for assistance.  04/04/2022, 12:27 PM

## 2022-04-25 NOTE — ED Triage Notes (Signed)
Patient presents to ed via [facility] from [place], patient was seen in ED  yest after a fall was seen so have pneumonia on xray and was treated with antibiotics. States he was having problems breathing during the night and his sats were in the 80's. Upon arrival alert oriented states he thinks he is breathing a little better.

## 2022-04-26 DIAGNOSIS — J9601 Acute respiratory failure with hypoxia: Secondary | ICD-10-CM | POA: Diagnosis not present

## 2022-04-26 DIAGNOSIS — J189 Pneumonia, unspecified organism: Secondary | ICD-10-CM | POA: Diagnosis not present

## 2022-04-26 DIAGNOSIS — I2510 Atherosclerotic heart disease of native coronary artery without angina pectoris: Secondary | ICD-10-CM | POA: Diagnosis not present

## 2022-04-26 DIAGNOSIS — J449 Chronic obstructive pulmonary disease, unspecified: Secondary | ICD-10-CM | POA: Diagnosis not present

## 2022-04-26 LAB — GLUCOSE, CAPILLARY
Glucose-Capillary: 107 mg/dL — ABNORMAL HIGH (ref 70–99)
Glucose-Capillary: 137 mg/dL — ABNORMAL HIGH (ref 70–99)
Glucose-Capillary: 126 mg/dL — ABNORMAL HIGH (ref 70–99)
Glucose-Capillary: 157 mg/dL — ABNORMAL HIGH (ref 70–99)

## 2022-04-26 LAB — CBC
HCT: 36.7 % — ABNORMAL LOW (ref 39.0–52.0)
Hemoglobin: 11.8 g/dL — ABNORMAL LOW (ref 13.0–17.0)
MCH: 29.6 pg (ref 26.0–34.0)
MCHC: 32.2 g/dL (ref 30.0–36.0)
MCV: 92.2 fL (ref 80.0–100.0)
Platelets: 274 10*3/uL (ref 150–400)
RBC: 3.98 MIL/uL — ABNORMAL LOW (ref 4.22–5.81)
RDW: 14.9 % (ref 11.5–15.5)
WBC: 12.8 10*3/uL — ABNORMAL HIGH (ref 4.0–10.5)
nRBC: 0 % (ref 0.0–0.2)

## 2022-04-26 LAB — COMPREHENSIVE METABOLIC PANEL
ALT: 16 U/L (ref 0–44)
AST: 24 U/L (ref 15–41)
Albumin: 2.7 g/dL — ABNORMAL LOW (ref 3.5–5.0)
Alkaline Phosphatase: 74 U/L (ref 38–126)
Anion gap: 13 (ref 5–15)
BUN: 40 mg/dL — ABNORMAL HIGH (ref 8–23)
CO2: 20 mmol/L — ABNORMAL LOW (ref 22–32)
Calcium: 8.5 mg/dL — ABNORMAL LOW (ref 8.9–10.3)
Chloride: 105 mmol/L (ref 98–111)
Creatinine, Ser: 1.54 mg/dL — ABNORMAL HIGH (ref 0.61–1.24)
GFR, Estimated: 43 mL/min — ABNORMAL LOW (ref >60)
Glucose, Bld: 149 mg/dL — ABNORMAL HIGH (ref 70–99)
Potassium: 4 mmol/L (ref 3.5–5.1)
Sodium: 138 mmol/L (ref 135–145)
Total Bilirubin: 0.8 mg/dL (ref 0.3–1.2)
Total Protein: 6.3 g/dL — ABNORMAL LOW (ref 6.5–8.1)

## 2022-04-26 LAB — PROCALCITONIN: Procalcitonin: 2.71 ng/mL

## 2022-04-26 MED ORDER — LORAZEPAM 2 MG/ML IJ SOLN
0.5000 mg | Freq: Four times a day (QID) | INTRAMUSCULAR | Status: DC | PRN
  Administered 2022-04-26 – 2022-04-27 (×2): 0.5 mg via INTRAVENOUS
  Filled 2022-04-26 (×2): qty 1

## 2022-04-26 MED ORDER — DEXTROSE-NACL 5-0.9 % IV SOLN: INTRAVENOUS | Status: AC

## 2022-04-26 NOTE — Progress Notes (Signed)
SLP Cancellation Note  Patient Details Name: Benjamin Mccall MRN: [id number] DOB: Nov 11, 1929   Cancelled treatment:       Reason Eval/Treat Not Completed: Patient's level of consciousness. Patient asleep, stable. He had a choking incident this morning when taking his medications with applesauce as well as consuming water. During this time his SpO2 dropped to 72% and his supplemental oxygen was increased and he was placed on HFNC. SLP spoke with patient's daughter who reported that she did not want to wake him as he is finally resting comfortably. SLP spoke with her about patient's h/o GERD and hiatal hernia and she reported that both were mild enough that they did not impact patient's swallowing or PO intake. She also reported that patient lives at an ALF, diagnosed 6 months ago with dementia. He is currently far from his baseline. SLP did not attempt bedside swallow evaluation at this time as patient is resting comfortably and goal for right now is supportive measures. SLP will plan to follow up next date for patient readiness to trial PO's.    Angela Nevin, MA, CCC-SLP Speech Therapy

## 2022-04-26 NOTE — TOC CAGE-AID Note (Signed)
Transition of Care [facility]) - CAGE-AID Screening   Patient Details  Name: Benjamin Mccall MRN: [id number] Date of Birth: November 21, 1929  Transition of Care [facility]) CM/SW Contact:    Katha Hamming, RN Phone Number:[phone] 04/26/2022, 9:46 PM   Clinical Narrative:  No drug/alcohol use hx, no resources indicated.  CAGE-AID Screening:    Have You Ever Felt You Ought to Cut Down on Your Drinking or Drug Use?: No Have People Annoyed You By Critizing Your Drinking Or Drug Use?: No Have You Felt Bad Or Guilty About Your Drinking Or Drug Use?: No Have You Ever Had a Drink or Used Drugs First Thing In The Morning to Steady Your Nerves or to Get Rid of a Hangover?: No CAGE-AID Score: 0  Substance Abuse Education Offered: No

## 2022-04-26 NOTE — Progress Notes (Signed)
Chocking on water after taking meds with apple sauce, Sao2 dropped to 72%. Encouraged patient to cough. Increased oxygen, called RT who switched patient to high flow [place]. Dr Tyson Babinski notified.

## 2022-04-26 NOTE — Progress Notes (Addendum)
PROGRESS NOTE    Benjamin Mccall  QJF:[id number] DOB: Jan 06, 1930 DOA: 04/06/2022 PCP: Janeece Agee, NP    Brief Narrative:   Benjamin Mccall is a 88 y.o. male with medical history significant of sinus bradycardia, sarcoidosis, atrial fibrillation, hypothyroidism, hyperlipidemia, GERD, CAD, hypertension, diabetes, COPD presented to hospital with shortness of breath.  Patient had been seen in the ED for generalized weakness and fall and was noted to have AKI and possible pneumonia.  Admission was offered but had initially declined and patient was discharged back to the facility with azithromycin and cefdinir but he was noted to be hypoxic at the facility so he was sent back to the ED for further evaluation.  In the ED, patient was noted to be hypoxic with pulse ox of 80% in room air which improved to 90% on 2 L of oxygen.  Labs showed creatinine elevated at 1.7 from baseline 1.4.  Leukocytosis was noted, COVID, RSV and flu was negative as well.  Urinalysis was negative for acute infection.  Blood cultures from initial visit were pending.  Chest x-ray showed bibasilar opacity representing pneumonia with interstitial prominence.  CT head scan did not show any acute findings.  Patient received Rocephin and Zithromax, IV fluids in the ED and was admitted hospital for further evaluation and treatment.  Assessment and plan.  Community-acquired pneumonia with acute hypoxic respiratory failure Patient presented with shortness of breath and was hypoxic at this facility.  Pulse Oximetry was in the 80s on room air and improved with 2 L of oxygen.  Chest x-ray with infiltrate and leukocytosis so we will continue with Rocephin and Zithromax, supplemental oxygen.  Blood cultures negative in less than 24 hours.  Procalcitonin elevated at 2.7.  Has mild leukocytosis at 12.8.  Currently on 3 L of oxygen by nasal cannula.  CAD No active issues continue aspirin Plavix Lipitor.  Hyperlipidemia Continue  Lipitor.   Hypertension - Continue home losartan, spironolactone, verapamil   Paroxysmal atrial fibrillation Not on anticoagulation or rate controlling medications.  Currently in sinus rhythm.  Hypothyroidism Continue Synthroid.   GERD Continue PPI and Pepcid   Diabetes mellitus type II. Sliding scale insulin, Accu-Cheks, diabetic diet.   COPD Continue nebulizers and inhalers during hospitalization,   Dementia Continue home Namenda, Celexa, as needed Ativan. CT head without acute findings from 04/24/22.  Diagnosed in the last 6 months.  Choking spells.  As per the nursing staff.  Will keep NPO.  Will get a speech therapy evaluation.   History of sarcoidosis Need outpatient follow-up.      DVT prophylaxis: enoxaparin (LOVENOX) injection 30 mg Start: 04/24/2022 1230   Code Status:     Code Status: DNR  Disposition: ALF currently, will reassess for PT. Patient is requiring assistance with bathing but otherwise okay.  Status is: Inpatient  Remains inpatient appropriate because: Altered mental status, pneumonia, IV antibiotic, difficulty with oral intake.   Family Communication:  I spoke with the patient's daughter-in-law on the phone and updated her about the clinical condition of the patient.  She states that if he were to continue to decline and suffer she would rather prefer him to have the hospice in place but is optimistic about the antibiotics potentially helping him.  Will also consult palliative care to address goals of care..  Consultants:  Palliative care  Procedures:  None  Antimicrobials:  Rocephin IV, azithromycin 04/08/2022>  Anti-infectives (From admission, onward)    Start     Dose/Rate Route Frequency Ordered  Stop   04/26/22 1000  cefTRIAXone (ROCEPHIN) 2 g in sodium chloride 0.9 % 100 mL IVPB        2 g 200 mL/hr over 30 Minutes Intravenous Every 24 hours 04/17/2022 1227 04/30/22 0959   04/26/22 1000  azithromycin (ZITHROMAX) 500 mg in sodium  chloride 0.9 % 250 mL IVPB        500 mg 250 mL/hr over 60 Minutes Intravenous Every 24 hours 04/07/2022 1227 04/30/22 0959   04/01/2022 1045  cefTRIAXone (ROCEPHIN) 1 g in sodium chloride 0.9 % 100 mL IVPB        1 g 200 mL/hr over 30 Minutes Intravenous  Once 04/18/2022 1040 04/26/2022 1229   04/03/2022 1045  azithromycin (ZITHROMAX) 500 mg in sodium chloride 0.9 % 250 mL IVPB        500 mg 250 mL/hr over 60 Minutes Intravenous  Once 04/03/2022 1040 03/28/2022 1331      Subjective: Today, patient was seen and examined at bedside.  Patient appears to be very confused, disoriented and mumbling. Poor historian. Saying yeah only.  Patient had choked with pills today.  Had to be put on a higher level of oxygen at 8 L/min to maintain his oxygenation  Objective: Vitals:   04/26/22 0800 04/26/22 1025 04/26/22 1030 04/26/22 1043  BP: (!) 183/94   (!) 170/92  Pulse: 85 96 91 87  Resp: (!) 23 (!) 25 (!) 25 (!) 21  Temp: 97.6 F (36.4 C)     TempSrc: Axillary     SpO2: 96% (!) 76% 94% 99%  Weight:      Height:       No intake or output data in the 24 hours ending 04/26/22 1406 Filed Weights   04/02/2022 1047  Weight: 70 kg    Physical Examination: Body mass index is 22.79 kg/m.   General:  Average built, not in obvious distress, on high flow HFNC at 8 L/min, appears weak and deconditioned and frail with mild distress. HENT:   No scleral pallor or icterus noted. Oral mucosa is moist.  Chest:    coarse breath sounds noted, wheezing noted, diminished breath sounds bilaterally. CVS: S1 &S2 heard. No murmur.  Regular rate and rhythm. Abdomen: Soft, nontender, nondistended.  Bowel sounds are heard.   Extremities: No cyanosis, clubbing or edema.  Peripheral pulses are palpable. Psych: Confused, disoriented, mumbling, difficult to understand speech, sounds congested  CNS:  confused and disoriented, Skin: Warm and dry.  No rashes noted.  Data Reviewed:   CBC: Recent Labs  Lab 04/24/22 0715  04/08/2022 1035 04/26/22 0232  WBC 17.3* 13.4* 12.8*  NEUTROABS  --  11.2*  --   HGB 12.4* 13.2 11.8*  HCT 37.3* 39.8 36.7*  MCV 92.3 91.7 92.2  PLT 291 285 [id number]    Basic Metabolic Panel: Recent Labs  Lab 04/24/22 0715 04/24/2022 1035 04/26/22 0232  NA 133* 135 138  K 4.5 4.4 4.0  CL 102 102 105  CO2 21* 20* 20*  GLUCOSE 150* 169* 149*  BUN 37* 41* 40*  CREATININE 2.02* 1.76* 1.54*  CALCIUM 8.5* 8.8* 8.5*    Liver Function Tests: Recent Labs  Lab 04/24/2022 1035 04/26/22 0232  AST 30 24  ALT 17 16  ALKPHOS 78 74  BILITOT 1.0 0.8  PROT 6.9 6.3*  ALBUMIN 3.0* 2.7*     Radiology Studies: DG Chest Port 1 View  Result Date: 03/27/2022 CLINICAL DATA:  Shortness of breath EXAM: PORTABLE CHEST 1 VIEW COMPARISON:  April 24, 2022 FINDINGS: Air under the right hemidiaphragm is consistent with a loop of colon interposed between liver and diaphragm. The finding is stable. Right hemidiaphragm is elevated. Bibasilar opacities are identified, new on the right and similar on the left in the interval. No pneumothorax. Interstitial prominence. The cardiomediastinal silhouette is stable. IMPRESSION: Bibasilar opacities, new on the right and similar on the left, could represent atelectasis or infiltrate. Pneumonia not excluded. Interstitial prominence concerning for mild edema versus atypical infection. No other abnormalities. Electronically Signed   By: Gerome Sam III M.D.   On: 05-13-22 10:58      LOS: 1 day    Joycelyn Das, MD [facility] Available via Epic secure chat 7am-7pm After these hours, please refer to coverage provider listed on [email or web address] 04/26/2022, 2:06 PM

## 2022-04-27 ENCOUNTER — Inpatient Hospital Stay (HOSPITAL_COMMUNITY): Payer: Medicare Other

## 2022-04-27 DIAGNOSIS — J189 Pneumonia, unspecified organism: Secondary | ICD-10-CM | POA: Diagnosis not present

## 2022-04-27 DIAGNOSIS — Z515 Encounter for palliative care: Secondary | ICD-10-CM

## 2022-04-27 DIAGNOSIS — J449 Chronic obstructive pulmonary disease, unspecified: Secondary | ICD-10-CM | POA: Diagnosis not present

## 2022-04-27 DIAGNOSIS — I2510 Atherosclerotic heart disease of native coronary artery without angina pectoris: Secondary | ICD-10-CM | POA: Diagnosis not present

## 2022-04-27 DIAGNOSIS — R0600 Dyspnea, unspecified: Secondary | ICD-10-CM

## 2022-04-27 DIAGNOSIS — J9601 Acute respiratory failure with hypoxia: Secondary | ICD-10-CM | POA: Diagnosis not present

## 2022-04-27 LAB — BASIC METABOLIC PANEL
Anion gap: 12 (ref 5–15)
BUN: 40 mg/dL — ABNORMAL HIGH (ref 8–23)
CO2: 22 mmol/L (ref 22–32)
Calcium: 8.7 mg/dL — ABNORMAL LOW (ref 8.9–10.3)
Chloride: 106 mmol/L (ref 98–111)
Creatinine, Ser: 1.39 mg/dL — ABNORMAL HIGH (ref 0.61–1.24)
GFR, Estimated: 48 mL/min — ABNORMAL LOW (ref >60)
Glucose, Bld: 220 mg/dL — ABNORMAL HIGH (ref 70–99)
Potassium: 4 mmol/L (ref 3.5–5.1)
Sodium: 140 mmol/L (ref 135–145)

## 2022-04-27 LAB — CBC
HCT: 41.5 % (ref 39.0–52.0)
Hemoglobin: 13 g/dL (ref 13.0–17.0)
MCH: 29.4 pg (ref 26.0–34.0)
MCHC: 31.3 g/dL (ref 30.0–36.0)
MCV: 93.9 fL (ref 80.0–100.0)
Platelets: 293 10*3/uL (ref 150–400)
RBC: 4.42 MIL/uL (ref 4.22–5.81)
RDW: 15.2 % (ref 11.5–15.5)
WBC: 15.8 10*3/uL — ABNORMAL HIGH (ref 4.0–10.5)
nRBC: 0 % (ref 0.0–0.2)

## 2022-04-27 LAB — GLUCOSE, CAPILLARY
Glucose-Capillary: 213 mg/dL — ABNORMAL HIGH (ref 70–99)
Glucose-Capillary: 155 mg/dL — ABNORMAL HIGH (ref 70–99)
Glucose-Capillary: 144 mg/dL — ABNORMAL HIGH (ref 70–99)

## 2022-04-27 LAB — PROCALCITONIN: Procalcitonin: 1.62 ng/mL

## 2022-04-27 LAB — MAGNESIUM: Magnesium: 2.1 mg/dL (ref 1.7–2.4)

## 2022-04-27 MED ORDER — ONDANSETRON 4 MG PO TBDP: 4.0000 mg | ORAL_TABLET | Freq: Four times a day (QID) | ORAL | Status: DC | PRN

## 2022-04-27 MED ORDER — DEXTROSE-NACL 5-0.9 % IV SOLN: INTRAVENOUS | Status: DC

## 2022-04-27 MED ORDER — HALOPERIDOL LACTATE 5 MG/ML IJ SOLN: 0.5000 mg | INTRAMUSCULAR | Status: DC | PRN

## 2022-04-27 MED ORDER — GLYCOPYRROLATE 0.2 MG/ML IJ SOLN
0.2000 mg | INTRAMUSCULAR | Status: DC | PRN
  Filled 2022-04-27: qty 1

## 2022-04-27 MED ORDER — MORPHINE SULFATE (PF) 2 MG/ML IV SOLN
2.0000 mg | INTRAVENOUS | Status: DC | PRN
  Administered 2022-04-27 – 2022-04-28 (×3): 2 mg via INTRAVENOUS
  Filled 2022-04-27 (×3): qty 1

## 2022-04-27 MED ORDER — ONDANSETRON HCL 4 MG/2ML IJ SOLN: 4.0000 mg | Freq: Four times a day (QID) | INTRAMUSCULAR | Status: DC | PRN

## 2022-04-27 MED ORDER — HALOPERIDOL LACTATE 2 MG/ML PO CONC: 0.5000 mg | ORAL | Status: DC | PRN

## 2022-04-27 MED ORDER — GLYCOPYRROLATE 1 MG PO TABS: 1.0000 mg | ORAL_TABLET | ORAL | Status: DC | PRN

## 2022-04-27 MED ORDER — BIOTENE DRY MOUTH MT LIQD: 15.0000 mL | OROMUCOSAL | Status: DC | PRN

## 2022-04-27 MED ORDER — POLYVINYL ALCOHOL 1.4 % OP SOLN: 1.0000 [drp] | Freq: Four times a day (QID) | OPHTHALMIC | Status: DC | PRN

## 2022-04-27 MED ORDER — HALOPERIDOL 0.5 MG PO TABS: 0.5000 mg | ORAL_TABLET | ORAL | Status: DC | PRN

## 2022-04-27 MED ORDER — GLYCOPYRROLATE 0.2 MG/ML IJ SOLN
0.2000 mg | INTRAMUSCULAR | Status: DC | PRN
  Administered 2022-04-27: 0.2 mg via INTRAVENOUS

## 2022-04-27 NOTE — Progress Notes (Signed)
Patient ID: Benjamin Mccall, male   DOB: 01-01-1930, 89 y.o.   MRN: [id number] Daughter Sharyn Lull called and updated.  Haydee Salter, RN

## 2022-04-27 NOTE — Plan of Care (Signed)
  Problem: Metabolic: Goal: Ability to maintain appropriate glucose levels will improve Outcome: Progressing   Problem: Clinical Measurements: Goal: Ability to maintain clinical measurements within normal limits will improve Outcome: Progressing Goal: Will remain free from infection Outcome: Progressing Goal: Cardiovascular complication will be avoided Outcome: Progressing   Problem: Elimination: Goal: Will not experience complications related to bowel motility Outcome: Progressing

## 2022-04-27 NOTE — Progress Notes (Signed)
Brief Palliative Medicine Progress Note:  PMT consult received and chart reviewed.   Noted Dr. Ailene Rud GOC conversation with patient's son and DIL this morning 1/1. They have opted for patient's transition to full comfort measures. EOL orders have already been placed for comfort and plan is for Mr. Burmester discharge to hospice. Discussed with Dr. Louanne Belton - no further PMT needs at this time.  If there are any imminent needs please call the service directly and/or re-consult if PMT can be of further assistance.  Thank you for allowing PMT to assist in the care of this patient.  Sheron Tallman M. [facility] Palliative Medicine Team Team Phone: [phone] NO CHARGE

## 2022-04-27 NOTE — Progress Notes (Signed)
SLP Cancellation Note  Patient Details Name: Benjamin Mccall MRN: [id number] DOB: 10/12/1929   Cancelled treatment:        Spoke with RN who reports he is somnolent and would not wake up for her this am; sats were 78%. ST will defer for now- asked RN to notify this therapist if he becomes appropriate to trial po's   Houston Siren 04/27/2022, 9:40 AM Orbie Pyo Colvin Caroli.Ed Product/process development scientist [phone]

## 2022-04-27 NOTE — Progress Notes (Addendum)
PROGRESS NOTE    Benjamin Mccall  NFA:[id number] DOB: 1929-09-19 DOA: 03/30/2022 PCP: Maximiano Coss, NP    Brief Narrative:   Benjamin Mccall is a 89 y.o. male with medical history significant of sinus bradycardia, sarcoidosis, atrial fibrillation, hypothyroidism, hyperlipidemia, GERD, CAD, hypertension, diabetes, COPD presented to hospital with shortness of breath.  Patient had been seen in the ED for generalized weakness and fall and was noted to have AKI and possible pneumonia.  Admission was offered but had initially declined and patient was discharged back to the facility with azithromycin and cefdinir but he was noted to be hypoxic at the facility so he was sent back to the ED for further evaluation.  In the ED, patient was noted to be hypoxic with pulse ox of 80% in room air which improved to 90% on 2 L of oxygen.  Labs showed creatinine elevated at 1.7 from baseline 1.4.  Leukocytosis was noted, COVID, RSV and flu was negative as well.  Urinalysis was negative for acute infection.  Blood cultures from initial visit were pending.  Chest x-ray showed bibasilar opacity representing pneumonia with interstitial prominence.  CT head scan did not show any acute findings.  Patient received Rocephin and Zithromax, IV fluids in the ED and was admitted hospital for further evaluation and treatment.  Assessment and plan.  Community-acquired pneumonia with acute hypoxic respiratory failure, underlying COPD Patient presented with shortness of breath and was hypoxic at this facility.  Pulse Oximetry was in the 80s on room air and improved with 2 L of oxygen chest x-ray 04/21/2022 showed bibasilar opacity more on the right than the left.  Currently on Rocephin and Zithromax, Today patient has increased oxygen demand with nonrebreather mask requiring up to 11 L of oxygen.  Will repeat chest x-ray.  Lactate was elevated at 2.7.  Blood cultures negative in 2 days Procalcitonin elevated at 1.6.  Continue  bronchodilators supportive care aspiration precautions.  Leukocytosis at 15.8 from 12.8.  CAD No active issues, patient was on aspirin Plavix Lipitor at home.  Currently high risk of aspiration..  Hyperlipidemia Lipitor as outpatient.  Plan for comfort care.   Hypertension Patient was on losartan, spironolactone, verapamil, outpatient.  Plan for comfort care at this time.   Paroxysmal atrial fibrillation Not on anticoagulation or rate controlling medications.  Currently in sinus rhythm.  Hypothyroidism Was on Synthroid.  Plan for comfort care.   GERD On PPI and Pepcid at home.  On comfort care.  Mildly elevated creatinine.  Will continue to monitor.  Creatinine today at 1.3.  No plans for further blood work.   Diabetes mellitus type II. On comfort care.  Discontinue sliding scale insulin  Dementia Patient was on home Namenda, Celexa, as needed Ativan. CT head without acute findings from 04/24/22.  Dementia was diagnosed in the last 6 months per the patient's family.  Choking spells.  As per the nursing staff.  Continue NPO.   Currently on D5 normal saline for hydration.   History of sarcoidosis Need outpatient follow-up.      DVT prophylaxis: enoxaparin (LOVENOX) injection 30 mg Start: 04/10/2022 1230   Code Status:     Code Status: DNR  Disposition:  Comfort care.  In the hospital death/hospice.  Status is: Inpatient  Remains inpatient appropriate because: Altered mental status, pneumonia, IV antibiotic, difficulty with oral intake, comfort care.   Family Communication:  I again spoke with the patient's daughter-in-law and son at bedside.  Discussed about goals of care  he is wishes for care.  They clearly expressed that patient did not want to continue to go through a lot and wished that patient was comfortable.  Goals of comfort care was explained and questions answered.  At this time we will de-escalate unnecessary medications and focus on  comfort care.  Communicated  with the patient's RN about it.  Consultants:  Palliative care  Procedures:  None  Antimicrobials:  Rocephin IV, azithromycin 04/24/2022> will discontinue   Subjective: Today, patient was seen and examined at bedside.  Nursing staff reported that the patient had increasing oxygen demand to 11 L on nonrebreather mask.  Patient not doing well.  Patient's family at bedside and at this time plan is to proceed with comfort care.  Patient seems to have declined quite significantly and did not wish to continue aggressive care as per the family.  Objective: Vitals:   04/26/22 2123 04/27/22 0459 04/27/22 0600 04/27/22 0800  BP: 136/84  (!) 148/79 (!) 143/80  Pulse: 72   (!) 108  Resp: 16   17  Temp: 98 F (36.7 C)  97.9 F (36.6 C) 97.8 F (36.6 C)  TempSrc: Oral  Axillary Axillary  SpO2:    94%  Weight:  68.8 kg    Height:        Intake/Output Summary (Last 24 hours) at 04/27/2022 0850 Last data filed at 04/26/2022 1929 Gross per 24 hour  Intake 350 ml  Output 600 ml  Net -250 ml   Filed Weights   03/31/2022 1047 04/27/22 0459  Weight: 70 kg 68.8 kg    Physical Examination: Body mass index is 22.4 kg/m.   General:  Average built, mild to moderate respiratory distress, high flow nasal cannula at 11 L/min, weak frail and deconditioned, lethargic. HENT:   No scleral pallor or icterus noted. Oral mucosa is moist.  Chest: Diminished breath sounds bilaterally. CVS: S1 &S2 heard. No murmur.  Regular rate and rhythm. Abdomen: Soft, nontender, nondistended.  Bowel sounds are heard.   Extremities: No cyanosis, clubbing or edema.  Peripheral pulses are palpable. Psych: Lethargic, barely responsive, CNS: Lethargic, barely responsive Skin: Warm and dry.  No rashes noted.  Data Reviewed:   CBC: Recent Labs  Lab 04/24/22 0715 03/31/2022 1035 04/26/22 0232 04/27/22 0248  WBC 17.3* 13.4* 12.8* 15.8*  NEUTROABS  --  11.2*  --   --   HGB 12.4* 13.2 11.8* 13.0  HCT 37.3* 39.8 36.7*  41.5  MCV 92.3 91.7 92.2 93.9  PLT 291 285 274 293     Basic Metabolic Panel: Recent Labs  Lab 04/24/22 0715 03/31/2022 1035 04/26/22 0232 04/27/22 0248  NA 133* 135 138 140  K 4.5 4.4 4.0 4.0  CL 102 102 105 106  CO2 21* 20* 20* 22  GLUCOSE 150* 169* 149* 220*  BUN 37* 41* 40* 40*  CREATININE 2.02* 1.76* 1.54* 1.39*  CALCIUM 8.5* 8.8* 8.5* 8.7*  MG  --   --   --  2.1     Liver Function Tests: Recent Labs  Lab 04/03/2022 1035 04/26/22 0232  AST 30 24  ALT 17 16  ALKPHOS 78 74  BILITOT 1.0 0.8  PROT 6.9 6.3*  ALBUMIN 3.0* 2.7*      Radiology Studies: DG Chest Port 1 View  Result Date: 04/19/2022 CLINICAL DATA:  Shortness of breath EXAM: PORTABLE CHEST 1 VIEW COMPARISON:  April 24, 2022 FINDINGS: Air under the right hemidiaphragm is consistent with a loop of colon interposed between liver  and diaphragm. The finding is stable. Right hemidiaphragm is elevated. Bibasilar opacities are identified, new on the right and similar on the left in the interval. No pneumothorax. Interstitial prominence. The cardiomediastinal silhouette is stable. IMPRESSION: Bibasilar opacities, new on the right and similar on the left, could represent atelectasis or infiltrate. Pneumonia not excluded. Interstitial prominence concerning for mild edema versus atypical infection. No other abnormalities. Electronically Signed   By: Dorise Bullion III M.D.   On: 2022/05/05 10:58      LOS: 2 days    Flora Lipps, MD [facility] Available via Epic secure chat 7am-7pm After these hours, please refer to coverage provider listed on [email or web address] 04/27/2022, 8:50 AM

## 2022-04-27 DEATH — deceased

## 2022-04-29 LAB — CULTURE, BLOOD (ROUTINE X 2)
Culture: NO GROWTH
Culture: NO GROWTH
Special Requests: ADEQUATE
Special Requests: ADEQUATE

## 2022-04-30 LAB — CULTURE, BLOOD (ROUTINE X 2)
Culture: NO GROWTH
Culture: NO GROWTH
Special Requests: ADEQUATE

## 2022-05-28 NOTE — Plan of Care (Signed)
At [phone] Patient shows no signs of spontaneous movement were present. Not responding verbal and tactile stimuli. No breathing sounds appreciated on both lungs.No carotid pulse palpable. No apical pulse heard during Ausculation. Patient pronounce dead at [phone] May 24, 2022 as confirmed and verified with another RN. Daughter Miss Constance Hackenberg notified via phone call.  Dr. Eulogio Bear informed. Post Mortem Documentation completed including donor hotline and Software engineer and [employer]. Post Mortem Care rendered. Patient shifted to the [facility] at [phone].

## 2022-05-28 NOTE — Discharge Summary (Incomplete)
Death summary

## 2022-05-28 NOTE — Death Summary Note (Signed)
DEATH SUMMARY   Patient Details  Name: Benjamin Mccall MRN: [id number] DOB: 05/13/1929 AOZ:[id number], Delfino Lovett, NP Admission/Discharge Information   Admit Date:  11-May-2022  Date of Death: Date of Death: 05/14/2022  Time of Death: Time of Death: 0655  Length of Stay: 3   Principle Cause of death: Aspiration pneumonia  Hospital Diagnoses: Principal Problem:   CAP (community acquired pneumonia) Active Problems:   Sarcoidosis   Hyperlipidemia   Coronary artery disease   Hypothyroidism   Essential hypertension   COPD GOLD II    GERD (gastroesophageal reflux disease)   Diabetes mellitus without complication (HCC)   PAF (paroxysmal atrial fibrillation) ([place])   Acute respiratory failure with hypoxia [facility])   Hospital Course: Benjamin Mccall is a 89 y.o. male with medical history significant of sinus bradycardia, sarcoidosis, atrial fibrillation, hypothyroidism, hyperlipidemia, GERD, CAD, hypertension, diabetes, COPD presented to hospital with shortness of breath.  Patient had been seen in the ED for generalized weakness and fall and was noted to have AKI and possible pneumonia.  Admission was offered but had initially declined and patient was discharged back to the facility with azithromycin and cefdinir but he was noted to be hypoxic at the facility so he was sent back to the ED for further evaluation.  In the ED, patient was noted to be hypoxic with pulse ox of 80% in room air which improved to 90% on 2 L of oxygen.  Labs showed creatinine elevated at 1.7 from baseline 1.4.  Leukocytosis was noted, COVID, RSV and flu was negative as well.  Urinalysis was negative for acute infection.  Blood cultures from initial visit were pending.  Chest x-ray showed bibasilar opacity representing pneumonia with interstitial prominence.  CT head scan did not show any acute findings.  Patient received Rocephin and Zithromax, IV fluids in the ED and was admitted hospital for further evaluation and  treatment.   Following conditions were addressed during hospitalization,   Community-acquired pneumonia with acute hypoxic respiratory failure, underlying COPD Patient presented with shortness of breath and was hypoxic at this facility.  Pulse Oximetry was in the 80s on room air and improved with 2 L of oxygen chest x-ray May 11, 2022 showed bibasilar opacity more on the right than the left.  Patient received Rocephin and Zithromax placed on but subsequently required increased oxygen demand with nonrebreather mask requiring up to 11 L of oxygen.  Lactate was elevated at 2.7.  Blood cultures negative in 2 days Procalcitonin elevated at 1.6.  Patient was continued on bronchodilators supportive care aspiration precautions.  Leukocytosis at 15.8 from 12.8.  Patient continued to decline and and after family meeting at bedside plan was to transition to comfort care.   CAD No active issues, patient was on aspirin Plavix Lipitor at home.   Hyperlipidemia Lipitor as outpatient.  Was initiated on comfort care.   Hypertension Patient was on losartan, spironolactone, verapamil, outpatient.  Was initiated on comfort care at this time.   Paroxysmal atrial fibrillation Not on anticoagulation or rate controlling medications.   Hypothyroidism Was on Synthroid. Was initiated on comfort care.   GERD On PPI and Pepcid at home.  On comfort care.   Mildly elevated creatinine.   On comfort care.   Diabetes mellitus type II. On comfort care.  Discontinued sliding scale insulin   Dementia Patient was on home Namenda, Celexa, as needed Ativan. CT head without acute findings from 04/24/22.  Dementia was diagnosed in the last 6 months per the patient's family.  Choking spells.  As per the nursing staff.  Was NPO, on D5 normal saline for hydration.  Was transition to comfort care.   History of sarcoidosis  Procedures: None  Consultations: None  The results of significant diagnostics from this  hospitalization (including imaging, microbiology, ancillary and laboratory) are listed below for reference.   Significant Diagnostic Studies: DG CHEST PORT 1 VIEW  Result Date: 04/27/2022 CLINICAL DATA:  Acute respiratory failure and hypoxia. EXAM: PORTABLE CHEST 1 VIEW COMPARISON:  04/08/2022 FINDINGS: Stable cardiomediastinal contours. Aortic atherosclerosis. There is progressive worsening aeration to both lower lobes which is favored to represent a combination of atelectasis and airspace disease. Progressive interstitial opacities identified bilaterally. Cannot exclude superimposed pulmonary edema. The costophrenic angles are obscured bilaterally. Pleural effusions are not excluded. IMPRESSION: 1. Progressive worsening aeration to both lower lobes which is favored to represent a combination of atelectasis and airspace disease. 2. Progressive interstitial opacities compatible with pulmonary edema. Cannot exclude pleural effusions. Electronically Signed   By: Signa Kell M.D.   On: 04/27/2022 11:31   DG Chest Port 1 View  Result Date: 03/30/2022 CLINICAL DATA:  Shortness of breath EXAM: PORTABLE CHEST 1 VIEW COMPARISON:  April 24, 2022 FINDINGS: Air under the right hemidiaphragm is consistent with a loop of colon interposed between liver and diaphragm. The finding is stable. Right hemidiaphragm is elevated. Bibasilar opacities are identified, new on the right and similar on the left in the interval. No pneumothorax. Interstitial prominence. The cardiomediastinal silhouette is stable. IMPRESSION: Bibasilar opacities, new on the right and similar on the left, could represent atelectasis or infiltrate. Pneumonia not excluded. Interstitial prominence concerning for mild edema versus atypical infection. No other abnormalities. Electronically Signed   By: Gerome Sam III M.D.   On: 04/19/2022 10:58   DG Chest Port 1 View  Result Date: 04/24/2022 CLINICAL DATA:  Concern for pneumonia EXAM: PORTABLE  CHEST 1 VIEW COMPARISON:  Multiple prior chest radiographs FINDINGS: Mild diffuse interstitial and patchy reticulonodular airspace opacities with most prominent in the left lung base. No large effusion or evidence of pneumothorax. No acute osseous abnormality. Thoracic spondylosis. Colonic interposition under the right hemidiaphragm. IMPRESSION: Interstitial and patchy airspace opacities bilaterally, most prominent in the left lung base, concerning for an infectious/inflammatory process. Electronically Signed   By: Caprice Renshaw M.D.   On: 04/24/2022 10:44   CT Head Wo Contrast  Result Date: 04/24/2022 CLINICAL DATA:  Head trauma, minor (Age >= 65y) EXAM: CT HEAD WITHOUT CONTRAST TECHNIQUE: Contiguous axial images were obtained from the base of the skull through the vertex without intravenous contrast. RADIATION DOSE REDUCTION: This exam was performed according to the departmental dose-optimization program which includes automated exposure control, adjustment of the mA and/or kV according to patient size and/or use of iterative reconstruction technique. COMPARISON:  CT head October 17, 23 FINDINGS: Brain: No evidence of acute infarction, hemorrhage, hydrocephalus, extra-axial collection or mass lesion/mass effect. Chronic microvascular ischemic disease. Cerebral atrophy. Vascular: No hyperdense vessel.  Calcific atherosclerosis. Skull: No acute fracture. Sinuses/Orbits: Clear sinuses.  No acute orbital findings. Other: No mastoid effusions. IMPRESSION: No evidence of acute intracranial abnormality. Electronically Signed   By: Feliberto Harts M.D.   On: 04/24/2022 08:44    Microbiology: Recent Results (from the past 240 hour(s))  Resp panel by RT-PCR (RSV, Flu A&B, Covid) Anterior Nasal Swab     Status: None   Collection Time: 04/24/22  7:25 AM   Specimen: Anterior Nasal Swab  Result Value Ref Range Status  SARS Coronavirus 2 by RT PCR NEGATIVE NEGATIVE Final    Comment: (NOTE) SARS-CoV-2 target  nucleic acids are NOT DETECTED.  The SARS-CoV-2 RNA is generally detectable in upper respiratory specimens during the acute phase of infection. The lowest concentration of SARS-CoV-2 viral copies this assay can detect is 138 copies/mL. A negative result does not preclude SARS-Cov-2 infection and should not be used as the sole basis for treatment or other patient management decisions. A negative result may occur with  improper specimen collection/handling, submission of specimen other than nasopharyngeal swab, presence of viral mutation(s) within the areas targeted by this assay, and inadequate number of viral copies(<138 copies/mL). A negative result must be combined with clinical observations, patient history, and epidemiological information. The expected result is Negative.  Fact Sheet for Patients:  EntrepreneurPulse.com.au  Fact Sheet for Healthcare Providers:  IncredibleEmployment.be  This test is no t yet approved or cleared by the [place] FDA and  has been authorized for detection and/or diagnosis of SARS-CoV-2 by FDA under an Emergency Use Authorization (EUA). This EUA will remain  in effect (meaning this test can be used) for the duration of the COVID-19 declaration under Section 564(b)(1) of the Act, 21 U.S.C.section 360bbb-3(b)(1), unless the authorization is terminated  or revoked sooner.       Influenza A by PCR NEGATIVE NEGATIVE Final   Influenza B by PCR NEGATIVE NEGATIVE Final    Comment: (NOTE) The Xpert Xpress SARS-CoV-2/FLU/RSV plus assay is intended as an aid in the diagnosis of influenza from Nasopharyngeal swab specimens and should not be used as a sole basis for treatment. Nasal washings and aspirates are unacceptable for Xpert Xpress SARS-CoV-2/FLU/RSV testing.  Fact Sheet for Patients: EntrepreneurPulse.com.au  Fact Sheet for Healthcare  Providers: IncredibleEmployment.be  This test is not yet approved or cleared by the [place] FDA and has been authorized for detection and/or diagnosis of SARS-CoV-2 by FDA under an Emergency Use Authorization (EUA). This EUA will remain in effect (meaning this test can be used) for the duration of the COVID-19 declaration under Section 564(b)(1) of the Act, 21 U.S.C. section 360bbb-3(b)(1), unless the authorization is terminated or revoked.     Resp Syncytial Virus by PCR NEGATIVE NEGATIVE Final    Comment: (NOTE) Fact Sheet for Patients: EntrepreneurPulse.com.au  Fact Sheet for Healthcare Providers: IncredibleEmployment.be  This test is not yet approved or cleared by the [place] FDA and has been authorized for detection and/or diagnosis of SARS-CoV-2 by FDA under an Emergency Use Authorization (EUA). This EUA will remain in effect (meaning this test can be used) for the duration of the COVID-19 declaration under Section 564(b)(1) of the Act, 21 U.S.C. section 360bbb-3(b)(1), unless the authorization is terminated or revoked.  Performed at [facility], [place] [address] [place], [place] [address]   Blood culture (routine x 2)     Status: None   Collection Time: 04/24/22 11:08 AM   Specimen: BLOOD RIGHT ARM  Result Value Ref Range Status   Specimen Description BLOOD RIGHT ARM  Final   Special Requests   Final    BOTTLES DRAWN AEROBIC AND ANAEROBIC Blood Culture adequate volume   Culture   Final    NO GROWTH 5 DAYS Performed at [facility], [place] [address], [place], [place] [address]    Report Status 04/29/2022 FINAL  Final  Blood culture (routine x 2)     Status: None   Collection Time: 04/24/22 11:13 AM   Specimen: BLOOD RIGHT ARM  Result Value Ref Range Status   Specimen Description BLOOD RIGHT ARM  Final   Special Requests   Final    BOTTLES DRAWN AEROBIC AND ANAEROBIC Blood Culture adequate  volume   Culture   Final    NO GROWTH 5 DAYS Performed at [facility], [address], [place], [place] [address]    Report Status 04/29/2022 FINAL  Final  Culture, blood (routine x 2)     Status: None (Preliminary result)   Collection Time: 04/03/2022 11:04 AM   Specimen: BLOOD RIGHT HAND  Result Value Ref Range Status   Specimen Description BLOOD RIGHT HAND  Final   Special Requests   Final    BOTTLES DRAWN AEROBIC AND ANAEROBIC Blood Culture adequate volume   Culture   Final    NO GROWTH 4 DAYS Performed at [facility], [address] [place], [place] [address]    Report Status PENDING  Incomplete  Culture, blood (routine x 2)     Status: None (Preliminary result)   Collection Time: 04/14/2022 11:10 AM   Specimen: BLOOD LEFT ARM  Result Value Ref Range Status   Specimen Description BLOOD LEFT ARM  Final   Special Requests   Final    BOTTLES DRAWN AEROBIC AND ANAEROBIC Blood Culture results may not be optimal due to an inadequate volume of blood received in culture bottles   Culture   Final    NO GROWTH 4 DAYS Performed at [facility], [address], [place], [place] [address]    Report Status PENDING  Incomplete  Respiratory (~20 pathogens) panel by PCR     Status: None   Collection Time: 04/16/2022  3:03 PM   Specimen: Nasopharyngeal Swab; Respiratory  Result Value Ref Range Status   Adenovirus NOT DETECTED NOT DETECTED Final   Coronavirus 229E NOT DETECTED NOT DETECTED Final    Comment: (NOTE) The Coronavirus on the Respiratory Panel, DOES NOT test for the novel  Coronavirus (2019 nCoV)    Coronavirus HKU1 NOT DETECTED NOT DETECTED Final   Coronavirus NL63 NOT DETECTED NOT DETECTED Final   Coronavirus OC43 NOT DETECTED NOT DETECTED Final   Metapneumovirus NOT DETECTED NOT DETECTED Final   Rhinovirus / Enterovirus NOT DETECTED NOT DETECTED Final   Influenza A NOT DETECTED NOT DETECTED Final   Influenza B NOT DETECTED NOT DETECTED Final    Parainfluenza Virus 1 NOT DETECTED NOT DETECTED Final   Parainfluenza Virus 2 NOT DETECTED NOT DETECTED Final   Parainfluenza Virus 3 NOT DETECTED NOT DETECTED Final   Parainfluenza Virus 4 NOT DETECTED NOT DETECTED Final   Respiratory Syncytial Virus NOT DETECTED NOT DETECTED Final   Bordetella pertussis NOT DETECTED NOT DETECTED Final   Bordetella Parapertussis NOT DETECTED NOT DETECTED Final   Chlamydophila pneumoniae NOT DETECTED NOT DETECTED Final   Mycoplasma pneumoniae NOT DETECTED NOT DETECTED Final    Comment: Performed at [facility], [address], [place], [place] [address]    Time spent: 35 minutes  Signed: Joycelyn Das, MD 05-10-2022

## 2022-05-28 DEATH — deceased
